# Patient Record
Sex: Male | Born: 1960 | Race: Black or African American | Hispanic: No | Marital: Married | Smoking: Never smoker
Health system: Southern US, Community
[De-identification: ages and names within clinical notes are randomized; demographics above are authoritative.]

## PROBLEM LIST (undated history)

## (undated) DIAGNOSIS — M109 Gout, unspecified: Secondary | ICD-10-CM

## (undated) DIAGNOSIS — E785 Hyperlipidemia, unspecified: Secondary | ICD-10-CM

## (undated) DIAGNOSIS — A159 Respiratory tuberculosis unspecified: Secondary | ICD-10-CM

## (undated) DIAGNOSIS — I5033 Acute on chronic diastolic (congestive) heart failure: Secondary | ICD-10-CM

## (undated) DIAGNOSIS — I10 Essential (primary) hypertension: Secondary | ICD-10-CM

## (undated) DIAGNOSIS — I4891 Unspecified atrial fibrillation: Secondary | ICD-10-CM

## (undated) DIAGNOSIS — J449 Chronic obstructive pulmonary disease, unspecified: Secondary | ICD-10-CM

## (undated) DIAGNOSIS — E669 Obesity, unspecified: Secondary | ICD-10-CM

## (undated) DIAGNOSIS — J45901 Unspecified asthma with (acute) exacerbation: Secondary | ICD-10-CM

## (undated) DIAGNOSIS — J441 Chronic obstructive pulmonary disease with (acute) exacerbation: Secondary | ICD-10-CM

## (undated) DIAGNOSIS — G4730 Sleep apnea, unspecified: Secondary | ICD-10-CM

## (undated) HISTORY — PX: BRONCHOSCOPY: SUR163

---

## 2004-08-21 ENCOUNTER — Ambulatory Visit: Payer: Self-pay | Admitting: General Surgery

## 2005-08-25 ENCOUNTER — Emergency Department: Payer: Self-pay | Admitting: General Practice

## 2007-07-05 ENCOUNTER — Emergency Department: Payer: Self-pay | Admitting: Unknown Physician Specialty

## 2007-11-22 ENCOUNTER — Ambulatory Visit: Payer: Self-pay | Admitting: Family Medicine

## 2008-03-07 ENCOUNTER — Ambulatory Visit: Payer: Self-pay | Admitting: Specialist

## 2008-10-02 ENCOUNTER — Ambulatory Visit: Payer: Self-pay | Admitting: Specialist

## 2008-12-19 ENCOUNTER — Emergency Department: Payer: Self-pay | Admitting: Emergency Medicine

## 2008-12-27 ENCOUNTER — Ambulatory Visit: Payer: Self-pay | Admitting: Internal Medicine

## 2009-11-14 ENCOUNTER — Ambulatory Visit: Payer: Self-pay | Admitting: Family Medicine

## 2009-11-19 ENCOUNTER — Other Ambulatory Visit: Payer: Self-pay | Admitting: Family Medicine

## 2010-04-18 ENCOUNTER — Inpatient Hospital Stay: Payer: Self-pay | Admitting: Internal Medicine

## 2013-02-20 ENCOUNTER — Ambulatory Visit: Payer: Self-pay | Admitting: Family Medicine

## 2014-09-18 ENCOUNTER — Inpatient Hospital Stay: Payer: Self-pay | Admitting: Family Medicine

## 2014-09-18 LAB — BASIC METABOLIC PANEL
Anion Gap: 6 — ABNORMAL LOW (ref 7–16)
BUN: 16 mg/dL (ref 7–18)
Calcium, Total: 8.2 mg/dL — ABNORMAL LOW (ref 8.5–10.1)
Chloride: 104 mmol/L (ref 98–107)
Co2: 29 mmol/L (ref 21–32)
Creatinine: 1.33 mg/dL — ABNORMAL HIGH (ref 0.60–1.30)
EGFR (African American): >60
EGFR (Non-African Amer.): 59 — ABNORMAL LOW
Glucose: 80 mg/dL (ref 65–99)
Osmolality: 278 (ref 275–301)
Potassium: 4.2 mmol/L (ref 3.5–5.1)
SODIUM: 139 mmol/L (ref 136–145)

## 2014-09-18 LAB — CBC
HCT: 46.3 % (ref 40.0–52.0)
HGB: 14.9 g/dL (ref 13.0–18.0)
MCH: 28 pg (ref 26.0–34.0)
MCHC: 32.3 g/dL (ref 32.0–36.0)
MCV: 87 fL (ref 80–100)
Platelet: 231 10*3/uL (ref 150–440)
RBC: 5.35 10*6/uL (ref 4.40–5.90)
RDW: 16.2 % — AB (ref 11.5–14.5)
WBC: 8.7 10*3/uL (ref 3.8–10.6)

## 2014-09-18 LAB — TROPONIN I
TROPONIN-I: 0.16 ng/mL — AB
Troponin-I: 0.14 ng/mL — ABNORMAL HIGH
Troponin-I: 0.16 ng/mL — ABNORMAL HIGH

## 2014-09-18 LAB — CK TOTAL AND CKMB (NOT AT ARMC)
CK, TOTAL: 1000 U/L — AB (ref 39–308)
CK-MB: 22.8 ng/mL — AB (ref 0.5–3.6)

## 2014-09-18 LAB — PROTIME-INR
INR: 1.1
PROTHROMBIN TIME: 14.3 s (ref 11.5–14.7)

## 2014-09-18 LAB — HEPARIN LEVEL (UNFRACTIONATED): ANTI-XA(UNFRACTIONATED): 0.18 [IU]/mL — AB (ref 0.30–0.70)

## 2014-09-18 LAB — PRO B NATRIURETIC PEPTIDE: B-TYPE NATIURETIC PEPTID: 414 pg/mL — AB (ref 0–125)

## 2014-09-18 LAB — APTT: ACTIVATED PTT: 36.5 s — AB (ref 23.6–35.9)

## 2014-09-19 LAB — HEPARIN LEVEL (UNFRACTIONATED): Anti-Xa(Unfractionated): 0.39 IU/mL (ref 0.30–0.70)

## 2014-09-19 LAB — CBC WITH DIFFERENTIAL/PLATELET
Basophil #: 0.1 10*3/uL (ref 0.0–0.1)
Basophil %: 1.2 %
EOS PCT: 4.7 %
Eosinophil #: 0.4 10*3/uL (ref 0.0–0.7)
HCT: 43.8 % (ref 40.0–52.0)
HGB: 14.2 g/dL (ref 13.0–18.0)
LYMPHS PCT: 11.7 %
Lymphocyte #: 1.1 10*3/uL (ref 1.0–3.6)
MCH: 27.6 pg (ref 26.0–34.0)
MCHC: 32.3 g/dL (ref 32.0–36.0)
MCV: 86 fL (ref 80–100)
Monocyte #: 0.9 x10 3/mm (ref 0.2–1.0)
Monocyte %: 9.3 %
NEUTROS ABS: 7 10*3/uL — AB (ref 1.4–6.5)
NEUTROS PCT: 73.1 %
Platelet: 221 10*3/uL (ref 150–440)
RBC: 5.12 10*6/uL (ref 4.40–5.90)
RDW: 15.8 % — ABNORMAL HIGH (ref 11.5–14.5)
WBC: 9.6 10*3/uL (ref 3.8–10.6)

## 2014-09-19 LAB — BASIC METABOLIC PANEL
Anion Gap: 4 — ABNORMAL LOW (ref 7–16)
BUN: 12 mg/dL (ref 7–18)
CALCIUM: 8.5 mg/dL (ref 8.5–10.1)
CO2: 29 mmol/L (ref 21–32)
Chloride: 103 mmol/L (ref 98–107)
Creatinine: 1.03 mg/dL (ref 0.60–1.30)
EGFR (Non-African Amer.): >60
Glucose: 88 mg/dL (ref 65–99)
Osmolality: 271 (ref 275–301)
Potassium: 3.9 mmol/L (ref 3.5–5.1)
Sodium: 136 mmol/L (ref 136–145)

## 2014-09-20 LAB — CBC WITH DIFFERENTIAL/PLATELET
BASOS PCT: 1.2 %
Basophil #: 0.1 10*3/uL (ref 0.0–0.1)
Eosinophil #: 0.6 10*3/uL (ref 0.0–0.7)
Eosinophil %: 5.7 %
HCT: 45.1 % (ref 40.0–52.0)
HGB: 14.7 g/dL (ref 13.0–18.0)
LYMPHS ABS: 1.2 10*3/uL (ref 1.0–3.6)
Lymphocyte %: 12.2 %
MCH: 27.6 pg (ref 26.0–34.0)
MCHC: 32.5 g/dL (ref 32.0–36.0)
MCV: 85 fL (ref 80–100)
Monocyte #: 1.1 x10 3/mm — ABNORMAL HIGH (ref 0.2–1.0)
Monocyte %: 11 %
NEUTROS ABS: 7.1 10*3/uL — AB (ref 1.4–6.5)
Neutrophil %: 69.9 %
Platelet: 248 10*3/uL (ref 150–440)
RBC: 5.31 10*6/uL (ref 4.40–5.90)
RDW: 16.4 % — ABNORMAL HIGH (ref 11.5–14.5)
WBC: 10.2 10*3/uL (ref 3.8–10.6)

## 2014-09-20 LAB — BASIC METABOLIC PANEL
Anion Gap: 8 (ref 7–16)
BUN: 16 mg/dL (ref 7–18)
CREATININE: 1.04 mg/dL (ref 0.60–1.30)
Calcium, Total: 8.5 mg/dL (ref 8.5–10.1)
Chloride: 101 mmol/L (ref 98–107)
Co2: 29 mmol/L (ref 21–32)
EGFR (African American): >60
EGFR (Non-African Amer.): >60
Glucose: 87 mg/dL (ref 65–99)
Osmolality: 276 (ref 275–301)
Potassium: 4.2 mmol/L (ref 3.5–5.1)
Sodium: 138 mmol/L (ref 136–145)

## 2014-09-20 LAB — HEPARIN LEVEL (UNFRACTIONATED): Anti-Xa(Unfractionated): 0.38 IU/mL (ref 0.30–0.70)

## 2014-10-18 ENCOUNTER — Inpatient Hospital Stay: Payer: Self-pay | Admitting: Family Medicine

## 2014-10-18 LAB — BASIC METABOLIC PANEL
ANION GAP: 6 — AB (ref 7–16)
BUN: 11 mg/dL (ref 7–18)
CALCIUM: 8.6 mg/dL (ref 8.5–10.1)
CO2: 29 mmol/L (ref 21–32)
Chloride: 104 mmol/L (ref 98–107)
Creatinine: 1.16 mg/dL (ref 0.60–1.30)
EGFR (African American): >60
EGFR (Non-African Amer.): >60
GLUCOSE: 122 mg/dL — AB (ref 65–99)
OSMOLALITY: 278 (ref 275–301)
Potassium: 4 mmol/L (ref 3.5–5.1)
SODIUM: 139 mmol/L (ref 136–145)

## 2014-10-18 LAB — CBC WITH DIFFERENTIAL/PLATELET
BASOS ABS: 0.1 10*3/uL (ref 0.0–0.1)
Basophil %: 1 %
EOS PCT: 1 %
Eosinophil #: 0.1 10*3/uL (ref 0.0–0.7)
HCT: 45 % (ref 40.0–52.0)
HGB: 14.3 g/dL (ref 13.0–18.0)
Lymphocyte #: 0.7 10*3/uL — ABNORMAL LOW (ref 1.0–3.6)
Lymphocyte %: 7.3 %
MCH: 27 pg (ref 26.0–34.0)
MCHC: 31.8 g/dL — AB (ref 32.0–36.0)
MCV: 85 fL (ref 80–100)
Monocyte #: 0.8 x10 3/mm (ref 0.2–1.0)
Monocyte %: 7.7 %
Neutrophil #: 8.5 10*3/uL — ABNORMAL HIGH (ref 1.4–6.5)
Neutrophil %: 83 %
Platelet: 265 10*3/uL (ref 150–440)
RBC: 5.3 10*6/uL (ref 4.40–5.90)
RDW: 15.5 % — AB (ref 11.5–14.5)
WBC: 10.2 10*3/uL (ref 3.8–10.6)

## 2014-10-18 LAB — TROPONIN I
TROPONIN-I: 0.18 ng/mL — AB
TROPONIN-I: 0.14 ng/mL — AB

## 2014-10-18 LAB — CK TOTAL AND CKMB (NOT AT ARMC)
CK, Total: 1450 U/L — ABNORMAL HIGH (ref 39–308)
CK-MB: 16.9 ng/mL — AB (ref 0.5–3.6)

## 2014-10-18 LAB — PRO B NATRIURETIC PEPTIDE: B-Type Natriuretic Peptide: 1161 pg/mL — ABNORMAL HIGH (ref 0–125)

## 2014-10-19 LAB — TROPONIN I: Troponin-I: 0.11 ng/mL — ABNORMAL HIGH

## 2014-10-19 LAB — CK TOTAL AND CKMB (NOT AT ARMC)
CK, Total: 1276 U/L — ABNORMAL HIGH (ref 39–308)
CK-MB: 15.8 ng/mL — ABNORMAL HIGH (ref 0.5–3.6)

## 2014-10-20 LAB — BASIC METABOLIC PANEL
Anion Gap: 5 — ABNORMAL LOW (ref 7–16)
BUN: 26 mg/dL — ABNORMAL HIGH (ref 7–18)
Calcium, Total: 8.4 mg/dL — ABNORMAL LOW (ref 8.5–10.1)
Chloride: 101 mmol/L (ref 98–107)
Co2: 31 mmol/L (ref 21–32)
Creatinine: 1.04 mg/dL (ref 0.60–1.30)
EGFR (African American): >60
Glucose: 121 mg/dL — ABNORMAL HIGH (ref 65–99)
Osmolality: 280 (ref 275–301)
Potassium: 4.3 mmol/L (ref 3.5–5.1)
Sodium: 137 mmol/L (ref 136–145)

## 2014-10-20 LAB — CBC WITH DIFFERENTIAL/PLATELET
BASOS ABS: 0 10*3/uL (ref 0.0–0.1)
Basophil %: 0.1 %
Eosinophil #: 0 10*3/uL (ref 0.0–0.7)
Eosinophil %: 0.1 %
HCT: 44.1 % (ref 40.0–52.0)
HGB: 14.1 g/dL (ref 13.0–18.0)
LYMPHS PCT: 4.2 %
Lymphocyte #: 0.8 10*3/uL — ABNORMAL LOW (ref 1.0–3.6)
MCH: 26.9 pg (ref 26.0–34.0)
MCHC: 31.9 g/dL — AB (ref 32.0–36.0)
MCV: 84 fL (ref 80–100)
MONO ABS: 1 x10 3/mm (ref 0.2–1.0)
MONOS PCT: 5 %
Neutrophil #: 18.2 10*3/uL — ABNORMAL HIGH (ref 1.4–6.5)
Neutrophil %: 90.6 %
Platelet: 300 10*3/uL (ref 150–440)
RBC: 5.23 10*6/uL (ref 4.40–5.90)
RDW: 15.4 % — ABNORMAL HIGH (ref 11.5–14.5)
WBC: 20 10*3/uL — ABNORMAL HIGH (ref 3.8–10.6)

## 2014-10-24 LAB — CULTURE, BLOOD (SINGLE)

## 2014-12-13 ENCOUNTER — Ambulatory Visit: Payer: Self-pay | Admitting: Family Medicine

## 2014-12-19 ENCOUNTER — Inpatient Hospital Stay: Payer: Self-pay | Admitting: Family Medicine

## 2015-01-05 ENCOUNTER — Inpatient Hospital Stay: Payer: Self-pay | Admitting: Internal Medicine

## 2015-01-22 LAB — CULTURE, FUNGUS WITHOUT SMEAR

## 2015-02-07 ENCOUNTER — Inpatient Hospital Stay
Admit: 2015-02-07 | Disposition: A | Discharge status: Home / Self Care | Payer: Self-pay | Attending: Internal Medicine | Admitting: Internal Medicine

## 2015-02-07 LAB — PRO B NATRIURETIC PEPTIDE: B-Type Natriuretic Peptide: 222 pg/mL — ABNORMAL HIGH

## 2015-02-07 LAB — COMPREHENSIVE METABOLIC PANEL
ANION GAP: 7 (ref 7–16)
Albumin: 2.6 g/dL — ABNORMAL LOW
Alkaline Phosphatase: 58 U/L
BILIRUBIN TOTAL: 0.6 mg/dL
BUN: 11 mg/dL
CALCIUM: 8.4 mg/dL — AB
CO2: 30 mmol/L
CREATININE: 0.91 mg/dL
Chloride: 98 mmol/L — ABNORMAL LOW
Glucose: 102 mg/dL — ABNORMAL HIGH
Potassium: 4.1 mmol/L
SGOT(AST): 33 U/L
SGPT (ALT): 18 U/L
Sodium: 135 mmol/L
Total Protein: 7.1 g/dL

## 2015-02-07 LAB — PROTIME-INR
INR: 1.6
PROTHROMBIN TIME: 19.1 s — AB

## 2015-02-07 LAB — CBC
HCT: 38.9 % — ABNORMAL LOW (ref 40.0–52.0)
HGB: 12.6 g/dL — AB (ref 13.0–18.0)
MCH: 26.5 pg (ref 26.0–34.0)
MCHC: 32.4 g/dL (ref 32.0–36.0)
MCV: 82 fL (ref 80–100)
Platelet: 278 10*3/uL (ref 150–440)
RBC: 4.75 10*6/uL (ref 4.40–5.90)
RDW: 19.5 % — AB (ref 11.5–14.5)
WBC: 8.3 10*3/uL (ref 3.8–10.6)

## 2015-02-07 LAB — TROPONIN I: Troponin-I: 0.03 ng/mL

## 2015-02-08 ENCOUNTER — Other Ambulatory Visit: Payer: Self-pay

## 2015-02-08 LAB — CBC WITH DIFFERENTIAL/PLATELET
Basophil #: 0 10*3/uL (ref 0.0–0.1)
Basophil %: 0.4 %
Eosinophil #: 0 10*3/uL (ref 0.0–0.7)
Eosinophil %: 0.1 %
HCT: 36.3 % — ABNORMAL LOW (ref 40.0–52.0)
HGB: 11.4 g/dL — ABNORMAL LOW (ref 13.0–18.0)
Lymphocyte #: 0.3 10*3/uL — ABNORMAL LOW (ref 1.0–3.6)
Lymphocyte %: 4.9 %
MCH: 25.9 pg — ABNORMAL LOW (ref 26.0–34.0)
MCHC: 31.5 g/dL — ABNORMAL LOW (ref 32.0–36.0)
MCV: 82 fL (ref 80–100)
Monocyte #: 0.2 x10 3/mm (ref 0.2–1.0)
Monocyte %: 3.8 %
Neutrophil #: 5.1 10*3/uL (ref 1.4–6.5)
Neutrophil %: 90.8 %
Platelet: 258 10*3/uL (ref 150–440)
RBC: 4.42 10*6/uL (ref 4.40–5.90)
RDW: 19.4 % — ABNORMAL HIGH (ref 11.5–14.5)
WBC: 5.6 10*3/uL (ref 3.8–10.6)

## 2015-02-08 LAB — BASIC METABOLIC PANEL
ANION GAP: 4 — AB (ref 7–16)
BUN: 12 mg/dL
CO2: 33 mmol/L — AB
Calcium, Total: 8.1 mg/dL — ABNORMAL LOW
Chloride: 99 mmol/L — ABNORMAL LOW
Creatinine: 0.91 mg/dL
EGFR (Non-African Amer.): >60
GLUCOSE: 127 mg/dL — AB
Potassium: 4.5 mmol/L
Sodium: 136 mmol/L

## 2015-02-08 LAB — PROTIME-INR
INR: 1.8
Prothrombin Time: 20.6 secs — ABNORMAL HIGH

## 2015-02-09 LAB — CBC WITH DIFFERENTIAL/PLATELET
BASOS ABS: 0 10*3/uL (ref 0.0–0.1)
Basophil %: 0.2 %
Eosinophil #: 0.2 10*3/uL (ref 0.0–0.7)
Eosinophil %: 1.9 %
HCT: 39.7 % — ABNORMAL LOW (ref 40.0–52.0)
HGB: 12.2 g/dL — AB (ref 13.0–18.0)
Lymphocyte #: 0.4 10*3/uL — ABNORMAL LOW (ref 1.0–3.6)
Lymphocyte %: 2.9 %
MCH: 26 pg (ref 26.0–34.0)
MCHC: 30.7 g/dL — AB (ref 32.0–36.0)
MCV: 85 fL (ref 80–100)
Monocyte #: 0.9 x10 3/mm (ref 0.2–1.0)
Monocyte %: 6.7 %
Neutrophil #: 11.4 10*3/uL — ABNORMAL HIGH (ref 1.4–6.5)
Neutrophil %: 88.3 %
Platelet: 292 10*3/uL (ref 150–440)
RBC: 4.68 10*6/uL (ref 4.40–5.90)
RDW: 19.7 % — ABNORMAL HIGH (ref 11.5–14.5)
WBC: 12.9 10*3/uL — ABNORMAL HIGH (ref 3.8–10.6)

## 2015-02-09 LAB — BASIC METABOLIC PANEL
ANION GAP: 5 — AB (ref 7–16)
BUN: 18 mg/dL
CALCIUM: 8.1 mg/dL — AB
CHLORIDE: 99 mmol/L — AB
Co2: 33 mmol/L — ABNORMAL HIGH
Creatinine: 0.95 mg/dL
EGFR (African American): >60
EGFR (Non-African Amer.): >60
GLUCOSE: 121 mg/dL — AB
POTASSIUM: 4.5 mmol/L
SODIUM: 137 mmol/L

## 2015-02-09 LAB — LIPID PANEL
CHOLESTEROL: 176 mg/dL
HDL Cholesterol: 47 mg/dL
Ldl Cholesterol, Calc: 116 mg/dL — ABNORMAL HIGH
TRIGLYCERIDES: 66 mg/dL
VLDL Cholesterol, Calc: 13 mg/dL

## 2015-02-09 LAB — PROTIME-INR
INR: 1.8
Prothrombin Time: 21.1 secs — ABNORMAL HIGH

## 2015-02-09 LAB — OCCULT BLOOD X 1 CARD TO LAB, STOOL: OCCULT BLOOD, FECES: NEGATIVE

## 2015-02-09 LAB — PHOSPHORUS: Phosphorus: 4.2 mg/dL

## 2015-02-09 LAB — MAGNESIUM: MAGNESIUM: 2 mg/dL

## 2015-02-09 LAB — HEMOGLOBIN A1C: Hemoglobin A1C: 5.5 %

## 2015-02-10 LAB — PROTIME-INR
INR: 1.7
Prothrombin Time: 20.4 secs — ABNORMAL HIGH

## 2015-02-11 LAB — PROTIME-INR
INR: 2
Prothrombin Time: 23 secs — ABNORMAL HIGH

## 2015-02-12 NOTE — Consult Note (Signed)
PATIENT NAME:  Jonathan [name], Jonathan [name] MR#:  [id number] DATE OF BIRTH:  [birth year]  DATE OF CONSULTATION:  09/19/2014  CONSULTING PHYSICIAN:  Marcina MillardAlexander Lowell Mcgurk, MD  PRIMARY CARE PHYSICIAN:  Marisue IvanKanhka Linthavong, MD   CARDIOLOGIST: Marcina MillardAlexander Najma Bozarth, MD   CHIEF COMPLAINT: Shortness of breath.   REASON FOR CONSULTATION: Consultation requested for evaluation of progressive exertional dyspnea and elevated cardiac enzymes suggestive of non-ST elevation myocardial infarction.   HISTORY OF PRESENT ILLNESS: The patient is a 54 year old gentleman with history of sleep apnea on CPAP. The patient has had a 1 to 2 week history of progressive exertional dyspnea. The patient presented to [facility] Emergency Room on 09/18/2014 for shortness of breath with minimal exertion and at rest. Admission labs were notable for elevated troponin of 0.16. CPK and MB were 1022.8, respectively. EKG was nondiagnostic The chest x-ray was nonspecific.   PAST MEDICAL HISTORY: 1. Sleep apnea.  2. Hypertension.  3. Chronic atrial fibrillation.  4. Hyperlipidemia.   MEDICATIONS: Pradaxa 150 mg b.i.[name]., digoxin 0.25 mg daily, losartan 50 mg daily, lovastatin 20 mg daily, metoprolol tartrate 75 mg b.i.[name]., Cardizem CD 180 mg daily, allopurinol 300 mg daily, colchicine 0.6 mg daily, albuterol inhaler 2 puffs 4 times daily.   SOCIAL HISTORY: The patient is married, resides with his wife. He denies tobacco abuse.   FAMILY HISTORY: No immediate family history for coronary artery disease or myocardial infarction.   REVIEW OF SYSTEMS:  CONSTITUTIONAL: No fever or chills.   EYES: No blurry vision.   EARS: No hearing loss.   RESPIRATORY: Progressive exertional dyspnea.   CARDIOVASCULAR: The patient denies chest pain.   GASTROINTESTINAL: No nausea, vomiting or diarrhea.   GENITOURINARY: No dysuria or hematuria.   ENDOCRINE: No polyuria or polydipsia.   MUSCULOSKELETAL: No arthralgias or myalgias.   NEUROLOGIC: No focal muscle  weakness or numbness.   PSYCHOLOGICAL: No depression or anxiety.   PHYSICAL EXAMINATION: VITAL SIGNS: Blood pressure 112/73, pulse 72, respirations 24, temperature 98.2, pulse oximetry 93%.   HEENT: Pupils equal, reactive to light and accommodation.   NECK: Supple without thyromegaly.   LUNGS: Decreased breath sounds.   HEART: Normal JVP. Normal PMI. Regular rate and rhythm. Normal S1, S2. No appreciable gallop, murmur or rub.   ABDOMEN: Soft and nontender. Pulses were intact bilaterally.   MUSCULOSKELETAL: Normal muscle tone.   NEUROLOGIC: The patient is alert and oriented x 3. Motor and sensory both grossly intact.   IMPRESSION: A 54 year old gentleman with progressive exertional dyspnea who presents with worsening shortness of breath with elevated troponin and cardiac isoenzymes suggestive of non-ST elevation myocardial infarction.   RECOMMENDATIONS: 1. Agree with overall current therapy.  2. Hold Pradaxa for now.  3. Continue heparin drip.  4. Proceed with right and left heart cardiac catheterization on 09/20/2014. The risks, benefits and alternatives were explained and informed written consent obtained.    ____________________________ Marcina MillardAlexander Lailana Shira, MD ap:TT [name]: 09/19/2014 09:14:00 ET T: 09/19/2014 14:20:13 ET JOB#: [id number]  cc: Marcina MillardAlexander Jamyria Ozanich, MD, <Dictator> Marisue IvanKanhka Linthavong, MD Marcina MillardALEXANDER Prestina Raigoza MD ELECTRONICALLY SIGNED 10/19/2014 13:07

## 2015-02-12 NOTE — Discharge Summary (Signed)
PATIENT NAME:  Jonathan [name], Jonathan [name] MR#:  [id number] DATE OF BIRTH:  06-04-1961  DATE OF ADMISSION:  09/18/2014 DATE OF DISCHARGE:    DISCHARGE DIAGNOSES:  1.  Dyspnea on exertion.  2.  Atrial fibrillation.  3.  Hypertension.  4.  Obstructive sleep apnea.   CONSULTS: Cardiology per Dr. Darrold JunkerParaschos.   PROCEDURES: Cardiac catheterization which was negative.   PERTINENT LABORATORY AND STUDIES:  On day of discharge sodium 138, potassium 4.2, creatinine 1.04. Troponin 0.14, 0.16, 0.16. CK-MB 22.8. White blood cell count 10.2, hemoglobin 14.7, and platelet count of 248,000.   Chest x-ray negative, no acute cardiopulmonary disease.   BRIEF HOSPITAL COURSE:  Dyspnea on exertion. The patient initially came in complaining of dyspnea on exertion and was found to have slightly elevated cardiac enzymes without any complaints of chest pain or EKG changes. He was seen by cardiology and underwent catheterization which was negative. This was not an NSTEMI which was documented earlier in the chart, per Dr. Darrold JunkerParaschos it is not an NSTEMI. Unclear on reason for dyspnea on exertion, but most likely restrictive lung disease due to his obesity. We will plan to continue with his home regimen, no changes from our standpoint, will need to restart the Pradaxa tomorrow.   DISPOSITION:  He is in stable condition and will be discharged to home.   FOLLOWUP:  Will follow up with Dr. Burnadette PopLinthavong within 10 days and follow up with Dr. Darrold JunkerParaschos per his recommendations.    ____________________________ Jonathan IvanKanhka Camey Edell, MD kl:bu [name]: 09/20/2014 13:19:41 ET T: 09/20/2014 15:39:20 ET JOB#: [id number]  cc: Jonathan IvanKanhka Merrik Puebla, MD, <Dictator> Jonathan IvanKANHKA Shereda Graw MD ELECTRONICALLY SIGNED 10/12/2014 15:26

## 2015-02-12 NOTE — H&P (Signed)
PATIENT NAME:  Jonathan [name], Jonathan [name] MR#:  [id number] DATE OF BIRTH:  [birth year]  DATE OF ADMISSION:  09/18/2014  PRIMARY CARE PHYSICIAN:  Marisue Ivan, MD   CARDIOLOGIST:  Marcina Millard, MD   CHIEF COMPLAINT: Shortness of breath.   HISTORY OF PRESENT ILLNESS: This is a 54 year old male who presents to the hospital with progressive shortness breath getting worse in the past week to 10 days. The patient says that his shortness of breath is worse with minimal exertion, like putting his clothes on or even taking a bath.  The patient went to see his cardiologist, Dr. Darrold Junker just before Thanksgiving, was thought to have bronchitis, was referred to see Dr. Meredeth Ide pulmonologist coming up this Monday.  Although his shortness of breath has continued to get worse and therefore came to the ER for further evaluation.  The patient denies any chest pain, any nausea, vomiting, he did have one episode of diaphoresis, with shortness of breath this past Monday but no other associated symptoms.  In the Emergency Room routine blood work, the patient was noted to have an elevated troponin and also elevated CK and CK-MB consistent with a possible non-ST elevation myocardial infarction.  Hospitalist services were contacted for further treatment and evaluation.   REVIEW OF SYSTEMS: CONSTITUTIONAL: Documented fever. No weight gain, no weight loss.  EYES: No blurred or double vision.  ENT: No tinnitus. No postnasal drip. No redness the oropharynx.  RESPIRATORY: Positive cough, no wheeze, no hemoptysis. Positive dyspnea.  CARDIOVASCULAR: No chest pain, no orthopnea or palpitation no syncope.  GASTROINTESTINAL: No nausea, no vomiting, diarrhea. No abdominal pain. No melena or hematochezia.  GENITOURINARY: No dysuria or hematuria.  ENDOCRINE: No polyuria or nocturia. No heat or cold intolerance.  HEMATOLOGIC: No anemia. No bruising. No bleeding.  INTEGUMENTARY: No rashes. No lesions.  MUSCULOSKELETAL: No  arthritis. No swelling. No gout.  NEUROLOGIC: No numbness or tingling. No ataxia. No seizure-type activity.  PSYCHIATRIC: No anxiety, no insomnia. No ADD.   PAST MEDICAL HISTORY: Consistent with obstructive sleep apnea, hypertension, history of chronic atrial fibrillation, gout, hypertension, hyperlipidemia.   ALLERGIES: No known drug allergies.   SOCIAL HISTORY: No smoking. No alcohol abuse. No illicit drug abuse. Lives at home with his wife.   FAMILY HISTORY: Mother and father are both deceased. Father died from complications of emphysema. Mother had cancer and also had renal disease and was on dialysis.   CURRENT MEDICATIONS: As follows: Allopurinol 300 mg daily Breo Ellipta  100/25 at 1 puff daily, colchicine 0.6 mg daily, digoxin 250 mcg daily, losartan 50 mg daily, lovastatin 20 mg daily, metoprolol tartrate 75 mg b.i.d., Pradaxa 150 mg b.i.d., Cardizem CD 180 mg daily, albuterol inhaler 2 puffs 4 times daily as needed.   PHYSICAL EXAMINATION: Presently is as follows:  VITAL SIGNS: Temperature is 98.7, pulse 74, respirations 25, blood pressure 134/71 saturations 97% on room air.  GENERAL: He is a pleasant-appearing male but in no apparent distress.  HEAD, EYES, EARS, NOSE AND THROAT: Atraumatic, normocephalic. Extraocular muscles are intact. Pupils are equal and reactive to light. Sclerae anicteric. No conjunctival injection. No oropharyngeal erythema.  NECK: Supple. There is no jugular venous distention. No bruits, no lymphadenopathy, no thyromegaly.  HEART: Regular rate and rhythm. No murmurs, no rubs, no clicks.  LUNGS: Clear to auscultation bilaterally. No rales, rhonchi, no wheezes.  ABDOMEN: Soft, flat, nontender, nondistended. Has good bowel sounds. No hepatosplenomegaly appreciated.  EXTREMITIES: No evidence of any cyanosis, clubbing, trace pedal edema from the knees  to the ankles bilaterally, +2 pedal and radial pulses bilaterally.  NEUROLOGICAL: The patient is alert, awake, and  oriented x 3 with no focal motor or sensory deficits appreciated bilaterally.  SKIN: Moist and warm with no rashes appreciated.  LYMPHATIC: There is no cervical lymphadenopathy.   LABORATORY DATA: Serum glucose of 80, BUN 16, creatinine 1.3, sodium 139, potassium 4.2, chloride 104, bicarbonate 29. CK 1000, CK-MB 22.8, troponin 0.14, white cell count 8.7, hemoglobin 14.9, hematocrit 46.3, platelet count 231,000.  INR is 1.1. The patient did have a chest x-ray done which showed no evidence of any acute cardiopulmonary disease.   ASSESSMENT AND PLAN: This is a 54 year old male with history of obstructive sleep apnea, hypertension, history of chronic atrial fibrillation, gout, hyperlipidemia who presents to the hospital due to worsening shortness of breath and noted to have a possible non-ST elevation myocardial infarction.  1.  Non-ST elevation myocardial infarction. The patient presented with shortness of breath, but no chest pain, likely shortness of breath is an anginal equivalent. The patient has ruled in by cardiac markers.  For now I will keep the patient on telemetry, start him on aspirin and heparin nomogram. Continue his beta blocker and a statin. I will get a 2-dimensional echocardiogram and also get a cardiology consult. Discussed the case with Dr. Darrold JunkerParaschos who will see the patient.  2.  History of chronic atrial fibrillation. The patient is rate controlled.  I will continue his metoprolol, Cardizem and digoxin. Hold his Pradaxa as he is going to be on a heparin nomogram.  3.  Hypertension. The patient is hemodynamically stable. We will continue his metoprolol, Cardizem and losartan.  4.  History of gout. No acute attack. Continue with the allopurinol and colchicine.  5.  Hyperlipidemia. Continue atorvastatin.  6.  Obstructive sleep apnea. Continue CPAP.   CODE STATUS: THE PATIENT IS A FULL CODE.   TIME SPENT:  50 minutes.     ____________________________ Rolly PancakeVivek J. Cherlynn KaiserSainani,  MD vjs:DT D: 09/18/2014 14:27:32 ET T: 09/18/2014 14:57:16 ET JOB#: [id number]  cc: Rolly PancakeVivek J. Cherlynn KaiserSainani, MD, <Dictator> Houston SirenVIVEK J SAINANI MD ELECTRONICALLY SIGNED 09/25/2014 15:55

## 2015-02-12 NOTE — H&P (Signed)
PATIENT NAME:  Jonathan [name], Oral D MR#:  [id number] DATE OF BIRTH:  07-15-1961  DATE OF ADMISSION:  10/18/2014  PRIMARY CARE PHYSICIAN:  Jonathan [name].    CHIEF COMPLAINT: Increasing shortness of breath, fever, and hypoxia noted at Jonathan [name]'s office.   HISTORY OF PRESENT ILLNESS:  Jonathan [name] is a 54 year old African-American gentleman with past medical history of hypertension, type 2 diabetes not on any medications, history of chronic atrial fibrillation on Pradaxa, who was admitted directly from Jonathan [name]'s office after he presented with fever, dry cough, not feeling well, tachycardia. Workup in Jonathan [name]'s office noted the patient was hypoxic with saturations of 88. Chest x-ray done in the office showed left-sided pneumonia. He is being admitted with acute hypoxic respiratory failure with pneumonia, possible pulmonary vascular congestion.   The patient states he has been feeling short of breath for the last 3-4 months, more so for the last 6 months, has gained about 20 pounds, and also has been retaining fluid in his legs. He does not have known history of congestive heart failure, however he does have atrial fibrillation and hypertension which have been long-standing.   PAST MEDICAL HISTORY:  1.  Chronic atrial fibrillation, on Pradaxa.   2.  Gout.  3.  Hypercholesterolemia.  4.  Type 2 diabetes not on any medications.  5.  Hypertension.  6.  Morbid obesity.  7.  Sleep apnea on CPAP.  8.  The patient underwent recently a cardiac catheterization in November of 2015, which showed EF of 60% and normal coronaries. He also had an echo done at that time that showed EF of 55-60%, no valvular abnormality   ALLERGIES: No known drug allergies.   MEDICATIONS:  1. Ventolin HFA 2 puffs 4 times a day.  2. Taztia XT 180 mg daily.  3. Pradaxa 150 mg b.i.d.  4. Metoprolol tartrate 50 mg 1 tablet b.i.d.  5. Lovastatin 20 mg p.o. daily.  6. Losartan 50 mg daily.  7. Indomethacin 50 mg 3 times a  day.  8. Digoxin 250 mcg 1 p.o. daily.  9. Colcrys 0.6 mg p.o. daily.  10. Breo Ellipta 100/25 1 puff once a day.  11. Benadryl 25 mg 1 capsule daily as needed.  12. Allopurinol 300 mg p.o. daily.   REVIEW OF SYSTEMS:    CONSTITUTIONAL: Positive for fever, fatigue, and weakness.  EYES: No blurred or double vision, glaucoma.  No cataract.  EARS, NOSE, AND THROAT:no congestion, sinus drainage or post nasal drip RESPIRATORY: Positive for cough, shortness of breath.  CARDIOVASCULAR: No chest pain. Positive for orthopnea, edema, and dyspnea on exertion.  GASTROINTESTINAL: No nausea, vomiting, diarrhea, abdominal pain. No GERD.  GENITOURINARY: No dysuria, hematuria, or frequency.  ENDOCRINE: No polyuria, nocturia, or thyroid problems.  HEMATOLOGY: No anemia, easy bruising, or bleeding.  SKIN: No acne, rash, or lesion.  MUSCULOSKELETAL: Positive for arthritis.  No swelling or gout.  NEUROLOGIC: No CVA, TIA, vertigo, or ataxia.  PSYCHIATRIC: No anxiety or depression.   All other systems reviewed and negative.   PHYSICAL EXAMINATION:  GENERAL: The patient is awake, alert, oriented x 3, not in acute distress.    VITAL SIGNS: Afebrile. Pulse is 90, blood pressure is 125/79, saturations are 92% on 3 liters.  Morbidly obese. HEENT: Atraumatic, normocephalic. Pupils PERRLA.  EOM intact. Oral mucosa is moist.  NECK: Supple. No JVD. No carotid bruit.  RESPIRATORY: Decreased breath sounds at the bases. No rales, rhonchi, respiratory distress, or labored breathing.  CARDIOVASCULAR: Both heart sounds  are normal. Mild tachycardia. No murmur heard. PMI not lateralized. Chest nontender.  Good pedal pulses, good femoral pulses. No lower extremity edema. ABDOMEN: Soft, benign, nontender. No organomegaly. Obesity present. EXTREMITIES:  Lower extremities, good pedal pulses, good femoral pulses, 2 + pitting edema both lower extremities.  NEUROLOGIC: Grossly intact cranial nerves II through XII. No motor or  sensory deficit.  PSYCHIATRIC: The patient is awake, alert, oriented x 3.   LABORATORY DATA:  The patient's laboratories are still pending.   ASSESSMENT: A 54 year old, Jonathan [name], with history of obstructive sleep apnea, hypertension, history of chronic atrial fibrillation on Pradaxa, gout, hyperlipidemia, came in due to worsening shortness of breath, was found to have:   1.  Systemic inflammatory response syndrome secondary to left-sided pneumonia. Chest x-ray done in Dr. Reita Cliche office showed pneumonia, per Jonathan [name] his saturations were in the 80s. The patient is going to be admitted on the medical floor, continue IV Rocephin and Zithromax. We will order blood cultures, CBC, metabolic panel. DuoNebs around the clock along with inhalers.  We will give him IV Solu-Medrol for his underlying COPD.  Wean as the patient improves.   2.  Acute hypoxic respiratory failure, could be combination of chronic obstructive pulmonary disease exacerbation with pneumonia and/or congestive heart failure, mild acute diastolic. His echo showed ejection fraction of 60%, he recently had a cardiac catheterization with normal coronaries. The patient has long-standing history of atrial fibrillation and hypertension as well. I will give him a dose of IV Lasix, see how he responds. He also has leg edema, 2 + pitting edema. His BNP is pending.  3.  History of chronic atrial fibrillation, rate controlled. Continue metoprolol, digoxin, and Cardizem. Continue Pradaxa.   4.  Hypertension. Continue metoprolol, Cardizem, and losartan.  5.  History of gout. No acute attack, continue allopurinol and colchicine.   6.  Hyperlipidemia, on atorvastatin.  7.  Obstructive sleep apnea. CPAP.  8.  Deep vein thrombosis prophylaxis. The patient is already on Pradaxa.    Further workup according to the patient's clinical course. A [name] consultation has been placed for Jonathan [name]. The above was discussed with patient  and the patient's wife who was present in the room.   TIME SPENT: 50 minutes.     ____________________________ Jonathan Hail Allena Katz, MD sap:bu D: 10/18/2014 15:30:54 ET T: 10/18/2014 15:48:50 ET JOB#: [id number]  cc: Oley Lahaie A. Allena Katz, MD, <Dictator> Willow Ora MD ELECTRONICALLY SIGNED 10/19/2014 18:44

## 2015-02-13 LAB — CULTURE, BLOOD (SINGLE)

## 2015-02-13 LAB — EXPECTORATED SPUTUM ASSESSMENT W GRAM STAIN, RFLX TO RESP C

## 2015-02-16 NOTE — Consult Note (Signed)
PATIENT NAME:  Jonathan [name], Jonathan [name] MR#:  [id number] DATE OF BIRTH:  Aug 03, 1961  DATE OF CONSULTATION:  10/19/2014  REFERRING PHYSICIAN:  Dr. Marisue IvanKanhka Linthavong CONSULTING PHYSICIAN:  Dwayne [name]. Callwood, MD    CHIEF COMPLAINT:  Shortness of breath, fever, hypoxemia, borderline troponins and possible pneumonia.   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 Verdie ShireFlemings office and after presenting  with fever, dry cough, not feeling well.  He had been recently hospitalized a few weeks ago for bronchitis and then returned again with more hypoxemia, shortness of breath and low-grade fever. His saturations were below 90. Chest x-ray done in the office showed left-sided pneumonia. The patient is being admitted for hypoxemia and shortness of breath and pulmonary vascular congestion with respiratory failure. He has had borderline troponins.  Atrial fibrillation appears to be rate controlled and he is on anticoagulation. He has been feeling short of breath for the last 3 to 4 months, not feeling that he is getting any better. He has gained about 20 or 30 pounds  retains fluid in his legs.  He denies history of  congestive heart failure and has had a cardiac catheterization in the past with no significant coronary disease.  He states that he is compliant with his medications; now here for evaluation.   PAST MEDICAL HISTORY: Chronic atrial fibrillation, gout, hyperlipidemia, diabetes, hypertension, morbid obesity, obstructive sleep apnea.    ALLERGIES:  No significant allergies.    MEDICATIONS:  He is on Ventolin, nebulizers as needed, diltiazem 180 mg daily, Pradaxa 150 twice a day, metoprolol 50 mg twice a day, lovastatin 20 mg a day, Indomethacin 50 mg 3 times a day, digoxin 0.25 mg once a day, Colcrys 0.6 mg a day, Benadryl 25 mg as needed, allopurinol 300 mg  daily, Breo Ellipta 100/25 mg 1 puff daily.   REVIEW OF SYSTEMS: Denies blkout spells syncope and no significant nausea or vomiting. He has had low-grade fever and some chills, minimal sweats. He has had weight gain, no weight loss. No hemoptysis or hematemesis. No bright red blood per rectum.  He has had shortness of breath, cough    are positive. No significant chest pain.   PHYSICAL EXAMINATION:  VITAL SIGNS: Blood pressure 130/80, pulse of 90 and irregular, respiratory rate 20 on 3 liters.  HEENT: Normocephalic, atraumatic. Pupils equal and reactive to light.  NECK: Supple. No significant JVD, bruits or adenopathy.  LUNGS: Bilateral rhonchi. Decreased breath sounds bilaterally no significant rales.  HEART: Irregularly irregular, systolic ejection murmur at the apex.  ABDOMEN: Benign.  EXTREMITIES: Within normal limits.  NEUROLOGIC: WNL  normal.  LABORATORY AND IMAGING DATA:  Chest x-ray appears to have a left-sided pneumonia. EKG, atrial fibrillation, rate controlled at about 80, nonspecific findings. .  Saturations are 93% on 3 liters.   ASSESSMENT:   1.  Respiratory failure.  2.  Hypoxemia.  3.  Pneumonia.  4.  Atrial fibrillation.  5.  Obesity.  6.  Shortness of breath.  7.  Hypertension.  8.  Gout.  9. Obesity.  10.  Edema.   PLAN: Agree with admit. Place on telemetry for atrial fibrillation, continue supplemental oxygen for dyspnea, shortness of breath and respiratory failure. Broad-spectrum antibiotics continued, for pneumonia agree with pulmonary input. Continue inhalers as necessary.  Follow up with chest x-ray. Consider steroid therapy for inflammation as well as DuoNebs, follow  up white count.  for hypoxemia, continue on  supplemental oxygen, echocardiogram  function, heart failure is  diastolic dysfunction, BNP will be helpful. Lasix therapy might be helpful for edema, as well as inhalers for congestion and steroids to help with hypoxemia.  2.  Chronic atrial  fibrillation, rate control, metoprolol,   3.  Hypertension, diltiazem and metoprolol. The patient also has had losartan. Will continue blood pressure control therapy.  4.  For gout he has been on in the past prescription allopurinol, appears stable from a gout perspective for now.  5. recommend weight loss, exercise, . 6.  Hyperlipidemia, continue Lipitor therapy, liver studies with his primary. 7.  Obstructive sleep apnea, I recommend CPAP therapy as well.  8.  Continue deep vein thrombosis prophylaxis with Pradaxa. I do not recommend any direct cardiac work-up at this point. The patient has been followed by We will continue to treat the patient medically for what appears to be respiratory related episode and see how the patient responds.    ____________________________ Bobbie Stack Juliann Pares, MD ddc:at [name]: 10/19/2014 09:15:51 ET T: 10/19/2014 09:43:59 ET JOB#: [id number]  cc: Dwayne [name]. Juliann Pares, MD, <Dictator> Alwyn Pea MD ELECTRONICALLY SIGNED 11/10/2014 14:03

## 2015-02-16 NOTE — Discharge Summary (Signed)
PATIENT NAME:  Jonathan [name], Jonathan [name] MR#:  [id number] DATE OF BIRTH:  31-Oct-1960  DATE OF ADMISSION:  10/18/2014 DATE OF DISCHARGE:  10/21/2014  DISCHARGE DIAGNOSES: 1. Left lower lobe pneumonia that is acute, that is community-acquired.  2. Restrictive lung disease.  3. History of atrial fibrillation on Pradaxa.   DISCHARGE MEDICATIONS:  1. Taztia XT 180 mg p.o. daily.  2. Lovastatin 20 mg p.o. daily.  3. Pradaxa 150 mg p.o. b.i.[name].  4. Colcrys 0.6 mg p.o. daily.  5. Digoxin 250 mcg p.o. daily.  6. Allopurinol 300 mg p.o. daily.  7. Losartan 50 mg p.o. daily.  8. Ventolin 90 mcg 2 puffs every 4 hours as needed for wheezing.  9. Breo Ellipta 100 mcg/25 mcg 1 puff daily.  10. Metoprolol tartrate 50 mg p.o. b.i.[name].  11. Levaquin 750 mg p.o. daily x 7 more days.  12. Prednisone taper x 12 days.    PROCEDURES: None.   PERTINENT LABORATORIES AND STUDIES: Chest x-ray confirmed left lower lobe pneumonia. Prior to discharge: Sodium 137, potassium 4.3, chloride 1.04, glucose 121. White blood cell count of 20, hemoglobin 14.1, and platelets of 300.   BRIEF HOSPITAL COURSE: Acute left lower lobe pneumonia. The patient has come in with acute on chronic respiratory failure due to left lower lobe community-acquired pneumonia, was placed on dual antibiotics and improved drastically. He was also placed on, prednisone. We will plan to transition to oral Levaquin and oral prednisone taper. His white blood cell count was elevated, but likely due to prednisone-induced.   DISPOSITION: He is in stable condition and will be discharged to home.   FOLLOWUP APPOINTMENTS: Follow with me in the clinic on Monday.    ____________________________ Jonathan IvanKanhka Stiven Kaspar, MD kl:mw [name]: 10/21/2014 08:31:58 ET T: 10/21/2014 12:22:55 ET JOB#: [id number]  cc: Jonathan IvanKanhka Othel Hoogendoorn, MD, <Dictator> Jonathan IvanKANHKA Jujuan Dugo MD ELECTRONICALLY SIGNED 11/04/2014 12:02

## 2015-02-20 NOTE — Discharge Summary (Signed)
PATIENT NAME:  Jonathan [name], Jonathan [name] MR#:  [id number] DATE OF BIRTH:  [birth year]  DATE OF ADMISSION:  02/07/2015 DATE OF DISCHARGE:  02/11/2015  DISCHARGE DIAGNOSES: 1.  Recurrent pneumonia. 2.  Drug-resistant tuberculosis. 3.  Sleep apnea.  4.  Atrial fibrillation.   CONDITION ON DISCHARGE: Stable.   MEDICATIONS ON DISCHARGE: 1.  Allopurinol 300 mg oral tablet once a day.  2.  Metoprolol 50 mg 2 times a day.  3.  Cardizem 300 mg extended release once a day.  4.  Furosemide 20 mg oral 2 times a day.  5.  Warfarin 10 mg oral tablet once a day on Saturday and Sunday and warfarin 4 mg oral tablet 2 tablets once a day except Saturday and Sunday.  6.  Lovastatin 20 mg oral once a day.  7.  Pyridoxine 25 mg oral tablet 2 times a week.  8.  Ethambutol 400 mg oral tablet 5 tablets 2 times a week on Monday and Thursday. 9.  Pyrazinamide  500 mg oral tablet 4 tablets 2 times a week on Monday and Thursday. 10.  Rifampin 300 mg oral capsule 2 times a week on Monday and Thursday. 11.  Isoniazid 300 mg oral tablet 2 times a week on Monday and Thursday.  12.  Losartan 50 mg oral tablet once a day.  13.  Digoxin 250 mcg oral once a day.  14.  Colcrys 0.6 mg oral tablet once a day as needed for gout.  15.  Ventolin 2 puff inhalation 4 times a day as needed for shortness of breath.  16.  Prednisone 10 mg oral tablet start at 60 mg and taper x 5 mg daily until complete.  17.  Spiriva 18 mcg oral capsule once a day, inhalation. 18.  Pantoprazole 40 mg delayed-release tablet once a day.  HOME OXYGEN: Advised 5 liters.  DISCHARGE DIET: Low sodium, regular consistency diet.   TIMEFRAME TO FOLLOWUP: Advised to follow with primary care doctor, Dr. Dennison Mascot in a month, primary pulmonologist, Dr. Ned Clines, and infectious disease, Dr. Colin Broach office.   HISTORY OF PRESENTING ILLNESS: A 54 year old African American male who came to the Emergency Room with shortness of breath for the last 2  days. He was diagnosed with recurrent pneumonia and was treated with IV antibiotic. He was recently diagnosed with tuberculosis and was under treatment by health department for the last 2 to 3 weeks. He was extremely short of breath, was hypoxic up to 88% on 5 liters oxygen saturation and could not tolerate it, exertion on ambulation, and so he was sent to Emergency Room for further management.   HOSPITAL COURSE AND STAY:  1.  Acute hypoxic respiratory failure secondary to recurrent pneumonia. It was health-care associated with underlying active tuberculosis, obstructive sleep apnea. He was initially admitted to CCU stepdown unit with 6 liters of oxygen via nasal cannula. IV antibiotics of Zosyn, vancomycin and levofloxacin was started. CT scan of the chest was done, which showed worsening or necrosis on the lungs. Infectious disease consult with Dr. Sampson Goon was done. He  suggested to stop Zosyn and just continue Levaquin, which we gave for 5 days. After that the patient was feeling significantly better, was comfortable on 5 liters oxygen and able to walk minimum in his room, to go to the bathroom, and so Dr. Sampson Goon agreed on his release home and continuing his tuberculosis medication, as per health department recommendation as the patient was taking it on admission. As the patient had recurrent  pneumonia, we also had gastroenterology consult for concern of his aspiration. They suggested to do precautions while eating but did not do any further invasive work-up because of his tuberculosis.  2.  Chronic atrial fibrillation. He was stable. We continued his Coumadin and metoprolol.   3.  Anemia. Guaiac was done, which was negative. Hemoglobin stayed stable in the hospital. 4.  Hypertension, essential hypertension with good control with medication.  5.  Lung damage with tuberculosis and recurrent pneumonias. The patient was using oxygen and we discharged him on oxygen at home with some  inhalers.  CONSULTATION IN HOSPITAL: Dr. Sampson [name] for infectious disease.   DIAGNOSTIC DATA: BNP level was 222 on admission. WBC 8.3, hemoglobin 12.6, platelet count 278,000. Glucose 102, BUN 11, creatinine 0.91, sodium 135, potassium 4.1. Troponin 0.03.   CT chest with and without contrast: Worsening multilobar necrotizing pneumonia with progressive cavitation throughout the lung bilaterally. Interval development of small amount of pericardial fluid and thickening.   Echocardiogram was done which showed ejection fraction of 50% to 55%, normal global left ventricular systolic function, mild to moderate increased left ventricular internal cavity size, moderately increased left anterior and posterior wall thickness.   TOTAL TIME SPENT ON THIS DISCHARGE: 40 minutes.  ____________________________ Jonathan PigeonVaibhavkumar G. Elisabeth PigeonVachhani, MD vgv:sb D: 02/14/2015 22:18:53 ET T: 02/15/2015 09:18:17 ET JOB#: [id number]  cc: Jonathan PigeonVaibhavkumar G. Elisabeth PigeonVachhani, MD, <Dictator> Jonathan E. Meredeth IdeFleming, MD Jonathan Mainlandavid P. Sampson GoonFitzgerald, MD Heath GoldVAIBHAVKUMAR [facility] Center[facility] MD ELECTRONICALLY SIGNED 02/15/2015 10:39

## 2015-02-20 NOTE — Consult Note (Addendum)
PATIENT NAME:  Jonathan [name], Jonathan [name] MR#:  [id number] DATE OF BIRTH:  02-22-1961  INFECTIOUS DISEASE CONSULTATION  DATE OF CONSULTATION:  02/08/2015.  REFERRING PHYSICIAN:  Ramonita Lab, MD.   CONSULTING PHYSICIAN:  Stann Mainland. Sampson Goon, MD.  REASON FOR CONSULTATION:  Tuberculosis and worsening pneumonia.   HISTORY OF PRESENT ILLNESS:  This is a 54 year old gentleman with a history of recent diagnosis of pulmonary tuberculosis based on a culture from a BAL.  He has been started on oral tuberculosis treatment through the Health Department.  He apparently is receiving it twice a week.  Apparently he has had followup cultures done with AFB smears being negative.   He was seen April 18 in the Health Department and was found to be short of breath and hypoxic.  He was brought to the Emergency Room where he had worsening bilateral lung opacities noted.  He was initially admitted to the intensive care unit.  He was also found to have some pericardial effusion.   PAST MEDICAL HISTORY:  1. Active TB, recently diagnosed, on therapy.  2. History of COPD. 3. Paroxysmal AFib, initially on Pradaxa but now on Coumadin.  4. Diastolic CHF.   5. Hypertension.  6. Diabetes.  7. Gout.  8. Hyperlipidemia.  9. Morbid obesity.  10.  OSA.  11.  Recurrent cavitary pneumonia since December 2015.   PAST SURGICAL HISTORY:  None.   ALLERGIES:  No known drug allergies.   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  He lives at home with his wife.  No history of smoking, alcohol, or drug use.   FAMILY HISTORY:  Positive for hypertension.   REVIEW OF SYSTEMS:  Eleven systems reviewed and negative except for HPI.   PHYSICAL EXAMINATION:  VITAL SIGNS:  Temperature 98.2, pulse 70, blood pressure 95/61, respirations 25, saturation 97% on 6 liters.  GENERAL:  He is chronically ill appearing.  He is lying in bed, however, and appears relatively comfortable on several liters of oxygen.  He is able to  speak in full sentences.  HEENT:  His pupils are reactive.  Sclerae are anicteric.  Oropharynx is clear.  NECK:  Supple.  He has no anterior cervical, posterior cervical, or supraclavicular lymphadenopathy.  HEART:  Regular but distant.  LUNGS:  Crackles in bilateral bases and poor air movement.  ABDOMEN:  Obese, soft, nontender.  EXTREMITIES:  1+ edema.  NEUROLOGIC:  He is alert and oriented x 3.  Grossly nonfocal neurologic exam.   LABORATORY DATA:   AFB from BAL done March 4 is positive for MTB with sensitivities pending.  AFB smear was negative at that time.  Urine legionella antigen was negative at that time.  He also had multiple cultures done on his BAL which were negative from March 4 for yeast and for routine bacteria.  Blood cultures have been negative from February 28.  White count on admission April 18 was 8.3, currently 5.6 on April 19.  Hemoglobin 11.4, platelets 258,000. INR 1.8.  Blood cultures negative x 1.  Renal function shows creatinine 0.91. LFTs normal except albumin low at 2.6.  Echocardiogram showed EF 50% to 55%, mild mitral valve regurgitation.   IMAGING:  CT of his chest April 18 showed worsening multilobar necrotizing pneumonia with progressive cavitation throughout the lungs bilaterally. There is interval development of small amount of pericardial fluid and/or thickening with proteinaceous hemorrhagic pericardial fluid.  Of note, the echocardiogram reports no evidence of pericardial effusion.  IMPRESSION:  A 54 year old with recently diagnosed tuberculosis, on therapy for several weeks, admitted with increasing shortness of breath and hypoxia.  He is relatively chronically ill with morbid obesity as well as sleep apnea.  There is some concern for aspiration given his body habitus and also the fact that on prior CT scans, he has had quite a patulous esophagus with fluid in it.  Clinically since admission, he seems somewhat improved.  He does not seem to be too far from his  baseline and in fact seems improved over his last admission.   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  This can occur in non-HIV infected patients as tuberculosis itself is immunosuppressive, and as treatment is initiated, patients will often get worse before they get better.  No other evidence of active pulmonary infection, although he has been covered with broad-spectrum antibiotics at this point.  He has been started on steroids which may be contributing to his improvement.   RECOMMENDATIONS:   1. I would continue anti-TB treatment.  Sensitivities are pending and should be back shortly.  He is under therapy with the local health department.  2. I would suggest discontinuing the vancomycin as we have no evidence of an MRSA infection at this point.  He has had BAL that was negative when we initially thought this might have been a pathogen.    3. Can continue the Zosyn and levofloxacin for another day but if he continues to remain stable, would discontinue those.  4. I would continue the Solu-Medrol, however, can wean that over the next 7-10 days.  5. Consider gastroenterology evaluation for possible aspiration.  His lungs are quite damaged, and if he does aspirate or develop another infection on top of this, it could be quite severe.    Thank you for the consult.  I will be glad to follow with you.    ____________________________ Stann Mainlandavid P. Sampson GoonFitzgerald, MD dpf:kc D: 02/08/2015 21:07:00 ET T: 02/08/2015 21:58:03 ET JOB#: [id number]  cc: Stann Mainlandavid P. Sampson GoonFitzgerald, MD, <Dictator> Kaladin Noseworthy Sampson GoonFITZGERALD MD ELECTRONICALLY SIGNED 02/22/2015 10:21

## 2015-02-20 NOTE — Op Note (Signed)
PATIENT NAME:  Jonathan [name], Jonathan [name] MR#:  [id number] DATE OF BIRTH:  15-Jul-1961  DATE OF PROCEDURE:  02/07/2015  PREOPERATIVE DIAGNOSES:  1.  Tuberculosis.  2.  Respiratory failure with hypoxia.   POSTOPERATIVE DIAGNOSIS:  1.  Tuberculosis.  2.  Respiratory failure with hypoxia.  PROCEDURES PERFORMED:  Insertion of triple-lumen catheter right internal jugular with ultrasound guidance.   SURGEON: Renford DillsGregory G Kunal Levario, M.[name].   BRIEF HISTORY:  The patient is in the intensive care unit and he is critically ill and hypoxic, but does not have adequate IV access.  Risks and benefits of a central line placement are reviewed.  The patient agrees to proceed.   DESCRIPTION OF PROCEDURE:  He is positioned supine and his right neck is prepped and draped in sterile fashion.  Ultrasound is placed in a sterile sleeve.  Jugular vein is identified.  It is echolucent and compressible indicating patency.  Image is recorded for the permanent record.  Under real-time visualization, the jugular vein is accessed with a micropuncture needle, microwire followed by micro sheath, J-wire followed by the dilator and then the triple-lumen catheter.  All three lumens aspirate and flush easily.  The catheter is secured to the skin of the neck with 2-0 silk and a sterile dressing with Biopatch is applied.  The patient tolerated the procedure well and there were no immediate complications.    ____________________________ Renford DillsGregory G. Abigail Marsiglia, MD ggs:[id number] [name]: 02/07/2015 20:29:11 ET T: 02/07/2015 21:09:17 ET JOB#: [id number]  cc: Renford DillsGregory G. Rayven Hendrickson, MD, <Dictator> Renford DillsGREGORY G Eveline Sauve MD ELECTRONICALLY SIGNED 02/08/2015 12:56

## 2015-02-20 NOTE — Consult Note (Signed)
Chief Complaint:  Subjective/Chief Complaint Patient states she feels somewhat better less short of breath less wheezing improved dyspnea still has leg edema no fevers feels better than yesterday.   VITAL SIGNS/ANCILLARY NOTES: **Vital Signs.:   30-Dec-15 11:59  Vital Signs Type Routine  Temperature Temperature (F) 97.6  Celsius 36.4  Pulse Pulse 76  Respirations Respirations 20  Systolic BP Systolic BP 774  Diastolic BP (mmHg) Diastolic BP (mmHg) 68  Mean BP 81  Pulse Ox % Pulse Ox % 93  Pulse Ox Activity Level  At rest  Oxygen Delivery Room Air/ 21 %  *Intake and Output.:   30-Dec-15 12:00  Oral Intake      In:  240  Percentage of Meal Eaten  100   Brief Assessment:  GEN well developed, well nourished, no acute distress, obese   Cardiac Irregular  murmur present  + LE edema  + JVD  --Gallop   Respiratory clear BS  postive use of accessory muscles  wheezing  rhonchi   Gastrointestinal Normal   Gastrointestinal details normal Soft   EXTR negative cyanosis/clubbing, positive edema   Lab Results: Routine Micro:  28-Dec-15 15:04   Organism Name COAGULASE NEGATIVE STAPHYLOCOCCUS  Micro Text Report BLOOD CULTURE   ORGANISM 1                COAGULASE NEGATIVE STAPHYLOCOCCUS   COMMENT                   IN AEROBIC BOTTLE ONLY   COMMENT                   POSSIBLE CONTAMINATION W/SKIN FLORA   GRAM STAIN                GRAM POSITIVE COCCI IN CLUSTERS   ANTIBIOTIC                       Organism 1 COAGULASE NEGATIVE STAPHYLOCOCCUS  Culture Comment IN AEROBIC BOTTLE ONLY  Culture Comment . POSSIBLE CONTAMINATION W/SKIN FLORA  Gram Stain 1 GRAM POSITIVE COCCI IN CLUSTERS  Routine Chem:  28-Dec-15 15:04   Glucose, Serum  122  BUN 11  Creatinine (comp) 1.16  Sodium, Serum 139  Potassium, Serum 4.0  Chloride, Serum 104  CO2, Serum 29  Calcium (Total), Serum 8.6  Anion Gap  6  Osmolality (calc) 278  eGFR (African American) >60  eGFR (Non-African American) >60 (eGFR  values <40m/min/1.73 m2 may be an indication of chronic kidney disease (CKD). Calculated eGFR, using the MRDR Study equation, is useful in  patients with stable renal function. The eGFR calculation will not be reliable in acutely ill patients when serum creatinine is changing rapidly. It is not useful in patients on dialysis. The eGFR calculation may not be applicable to patients at the low and high extremes of body sizes, pregnant women, and vegetarians.)  Result Comment TROPONIN - RESULTS VERIFIED BY REPEAT TESTING.  - C/TO DOLL FERGUSON AT 1610 10/18/2014  - BY [facility].  - READ-BACK PROCESS PERFORMED.  Result(s) reported on 18 Oct 2014 at 03:54PM.  Result Comment AEROBIC BOTTLE - NOTIFIED OF CRITICAL VALUE  - READ-BACK PROCESS PERFORMED.  - JESSICA CHRISTMAS 10/20/14 @ 1053..Marland KitchenMarland KitchenTV  Result(s) reported on 24 Oct 2014 at 07:08AM.  B-Type Natriuretic Peptide ([facility]  1161 (Result(s) reported on 18 Oct 2014 at 03:36PM.)    18:55   Result Comment TROPONIN - RESULTS VERIFIED BY REPEAT TESTING.  - PREV.CALLED BY TFK @  1610 ON 10/18/14  - CAF  Result(s) reported on 18 Oct 2014 at 07:31PM.    23:17   Result Comment TROPONIN. - RESULTS VERIFIED BY REPEAT TESTING.  - Elevated troponin previously called @  - 1610 10/18/14 by [facility] - EKM.  Result(s) reported on 19 Oct 2014 at 12:10AM.  30-Dec-15 03:56   Glucose, Serum  121  BUN  26  Creatinine (comp) 1.04  Sodium, Serum 137  Potassium, Serum 4.3  Chloride, Serum 101  CO2, Serum 31  Calcium (Total), Serum  8.4  Anion Gap  5  Osmolality (calc) 280  eGFR (African American) >60  eGFR (Non-African American) >60 (eGFR values <27m/min/1.73 m2 may be an indication of chronic kidney disease (CKD). Calculated eGFR, using the MRDR Study equation, is useful in  patients with stable renal function. The eGFR calculation will not be reliable in acutely ill patients when serum creatinine is changing rapidly. It is not useful in patients on dialysis.  The eGFR calculation may not be applicable to patients at the low and high extremes of body sizes, pregnant women, and vegetarians.)  Cardiac:  28-Dec-15 15:04   Troponin I  0.18 (0.00-0.05 0.05 ng/mL or less: NEGATIVE  Repeat testing in 3-6 hrs  if clinically indicated. >0.05 ng/mL: POTENTIAL  MYOCARDIAL INJURY. Repeat  testing in 3-6 hrs if  clinically indicated. NOTE: An increase or decrease  of 30% or more on serial  testing suggests a  clinically important change)  CK, Total  1450  CPK-MB, Serum  16.9 (Result(s) reported on 18 Oct 2014 at 04:14PM.)    18:55   Troponin I  0.14 (0.00-0.05 0.05 ng/mL or less: NEGATIVE  Repeat testing in 3-6 hrs  if clinically indicated. >0.05 ng/mL: POTENTIAL  MYOCARDIAL INJURY. Repeat  testing in 3-6 hrs if  clinically indicated. NOTE: An increase or decrease  of 30% or more on serial  testing suggests a  clinically important change)    23:16   CK, Total  1276  CPK-MB, Serum  15.8 (Result(s) reported on 19 Oct 2014 at 12:42AM.)    23:17   Troponin I  0.11 (0.00-0.05 0.05 ng/mL or less: NEGATIVE  Repeat testing in 3-6 hrs  if clinically indicated. >0.05 ng/mL: POTENTIAL  MYOCARDIAL INJURY. Repeat  testing in 3-6 hrs if  clinically indicated. NOTE: An increase or decrease  of 30% or more on serial  testing suggests a  clinically important change)  Routine Hem:  28-Dec-15 15:04   WBC (CBC) 10.2  RBC (CBC) 5.30  Hemoglobin (CBC) 14.3  Hematocrit (CBC) 45.0  Platelet Count (CBC) 265  MCV 85  MCH 27.0  MCHC  31.8  RDW  15.5  Neutrophil % 83.0  Lymphocyte % 7.3  Monocyte % 7.7  Eosinophil % 1.0  Basophil % 1.0  Neutrophil #  8.5  Lymphocyte #  0.7  Monocyte # 0.8  Eosinophil # 0.1  Basophil # 0.1 (Result(s) reported on 18 Oct 2014 at 03:35PM.)  30-Dec-15 03:56   WBC (CBC)  20.0  RBC (CBC) 5.23  Hemoglobin (CBC) 14.1  Hematocrit (CBC) 44.1  Platelet Count (CBC) 300  MCV 84  MCH 26.9  MCHC  31.9  RDW  15.4   Neutrophil % 90.6  Lymphocyte % 4.2  Monocyte % 5.0  Eosinophil % 0.1  Basophil % 0.1  Neutrophil #  18.2  Lymphocyte #  0.8  Monocyte # 1.0  Eosinophil # 0.0  Basophil # 0.0 (Result(s) reported on 20 Oct 2014 at 05:13AM.)  Assessment/Plan:  Assessment/Plan:  Assessment IMP  shortness of breath  respiratory failure  hypoxemia  bronchitis  obesity  obstructive sleep apnea  edema  atrial fibrillation  hypertension  elevated troponin  diabetes  hyperlipidemia  gout .   Plan PLAN  continue supplemental oxygen  agree with antibiotic therapy to help bronchitis symptoms  continue inhalers to help with wheezing  recommend weight loss excise portion control  continue CPAP therapy for obstructive sleep apnea  telemetry for atrial fibrillation  continue Pradaxa for anticoagulation for AFib  rate control diltiazem  gout continue  allopurinol Indocin colcrys  edema possibly related to diltiazem as well as follows congestion recommend low-dose diuretic therapy as well as CPAP for obstructive           sleep apnea  have the patient follow up with primary physician as an outpatient cardiologist one to two weeks  probable demand ischemia angina recommended direct evaluation or invasive studies for a mildly elevated troponin   Electronic Signatures: Lujean Amel D (MD)  (Signed 24-Jan-16 09:40)  Authored: Chief Complaint, VITAL SIGNS/ANCILLARY NOTES, Brief Assessment, Lab Results, Assessment/Plan   Last Updated: 24-Jan-16 09:40 by Lujean Amel D (MD)

## 2015-02-20 NOTE — H&P (Signed)
PATIENT NAME:  Jonathan [name], Jonathan [name] MR#:  [id number] DATE OF BIRTH:  [birth year]  DATE OF ADMISSION:  02/07/2015  PRIMARY CARE PHYSICIAN: Dennison Mascot, MD.   PRIMARY INFECTIOUS DISEASE: Stann Mainland. Sampson Goon, MD  PRIMARY PULMONOLOGIST: Clenton Pare. Meredeth Ide, MD   REFERRING EMERGENCY ROOM PHYSICIAN: Su Ley, MD   CHIEF COMPLAINT: Shortness of breath.   HISTORY OF PRESENT ILLNESS: The patient is a 54 year old African American male, presenting to the ED with a chief complaint of shortness of breath for the past 2 days. The patient recently was admitted to the hospital and was diagnosed with recurrent pneumonia and was sent home without IV antibiotics. The patient was getting treatment for active tuberculosis at healthcare department. Today, the patient went to see [facility] for his medications on Monday and follow-up appointment, and he was found to be short of breath. The patient is sent over from healthcare department to the hospital because he was short of breath. The patient was hypoxic at 88% on 5 liters and saturating at 96% on 6 liters. He is resting comfortably, but with minimal exertion, he is becoming short of breath. When talking also, the patient is becoming short of breath. According to the healthcare department notes, the patient's recent 2 AFB smears are negative, and he does not need to be on isolation. Chest x-ray, PA and lateral views in the ED, has revealed bilateral lung opacities, which are slightly improved, compared to the prior exam, and they are not quite sure whether it represents pneumonia or atelectasis, as it has mild bilateral pleural effusions. Hospitalist team is called to admit the patient. The patient was given IV antibiotics in the ED. During my examination, the patient is just complaining of shortness of breath and coughing, but denies any chest pain or tightness.   PAST MEDICAL HISTORY: Active tuberculosis, but recent 2 AFB stains are  negative; history of COPD; history of paroxysmal atrial fibrillation on Coumadin, discontinued his Pradaxa . Also has a history of diastolic congestive heart failure, hypertension, diabetes mellitus, gout, hyperlipidemia, morbid obesity, obstructive sleep apnea, recurrent cavitary pneumonia since December 2015.   PAST SURGICAL HISTORY: None.   ALLERGIES: No known drug allergies.   PSYCHOSOCIAL HISTORY: Lives at home with wife. No history of smoking, alcohol, or illicit drug usage.  FAMILY HISTORY: Hypertension runs in his family.   REVIEW OF SYSTEMS:  CONSTITUTIONAL: Denies fever but complaining of fatigue and weakness.  EYES: Denies blurry vision, double vision.  EARS, NOSE, AND THROAT: Denies epistaxis, discharge.  RESPIRATORY: Complaining of cough and shortness of breath with chest tightness. Has chronic history of COPD, obstructive sleep apnea.  CARDIOVASCULAR: No chest pain, palpitations.   GASTROINTESTINAL: Denies nausea, vomiting, diarrhea, abdominal pain.  GENITOURINARY: No dysuria or hematuria.  ENDOCRINE: Denies polyuria or nocturia. Has history of diabetes mellitus.  HEMATOLOGIC AND LYMPHATIC: No anemia, easy bruising, bleeding.  INTEGUMENTARY: No acne, rash, lesions.  MUSCULOSKELETAL: No joint pain in the neck and back. Has a history of gout.  NEUROLOGIC: Denies any vertigo, ataxia. PYSCHIATRIC: No ADD or OCD.   PHYSICAL EXAMINATION: VITAL SIGNS: Temperature 98.5, pulse 99, respirations 34, blood pressure 121/69, pulse oximetry 95% to 96%.  GENERAL APPEARANCE: Not in acute distress, but becoming short of breath with minimal exertion while talking.  HEENT: Normocephalic, atraumatic. Pupils are equally reacting to light and accommodation. No scleral icterus. No conjunctival injection. No sinus tenderness, moist mucous membranes.  NECK: Supple. No JVD. No thyromegaly. Range of motion is intact.  LUNGS: Positive crackles and decreased breath sounds.  CARDIOVASCULAR: S1, S2  normal. Regular rate and rhythm.  GENITOURINARY: Abdomen soft. Bowel sounds are positive in all 4 quadrants. Nontender, nondistended. No masses felt.  NEUROLOGIC: Awake, alert, oriented x 3. Cranial nerves II through XII are grossly intact. Reflexes are 2+, following verbal commands.  EXTREMITIES: No cyanosis. No clubbing.   LABORATORIES AND IMAGING STUDIES: Chest x-ray, PA and lateral views: Bilateral lung opacities are noted, which are slightly improved compared to the prior exam. It is uncertain if this represents pneumonia, atelectasis, or edema. Mild bilateral pleural effusions are noted. Stable cardiomegaly is noted.   Troponin 0.03. WBC 8.3, hemoglobin 12.6, hematocrit 38.9, platelets are 278,000. PT 19.1, INR 1.6, BNP 222, glucose 102, BUN 11, creatinine 0.91, sodium 135, potassium 4.1, chloride 98, CO2 of 30. GFR greater than 60. Anion gap 7. Serum calcium is 8.4.   CAT scan of the chest is ordered, which is pending at this time.   Twelve-lead EKG: Normal sinus rhythm with tachycardia at 93 beats per minute with occasional PVCs, nonspecific T-wave abnormality, normal PR interval,   ASSESSMENT AND PLAN: A 54 year old obese African American male with a history of active tuberculosis and recurrent pneumonia since December 2015 had been to healthcare department at [facility] for follow-up appointment and was found to be very short of breath and hypoxic and was sent over to the Emergency Department. According to healthcare department note, recent 2 AFB smears were negative, and he does not need to be on isolation precautions. Chest x-ray, two views has revealed pneumonia with atelectasis with mild pleural effusions.  1. Acute hypoxic respiratory failure secondary to recurrent pneumonia, which is healthcare associated, with underlying active tuberculosis and obstructive sleep apnea. We will admit him to CCU stepdown, continue 6 liters of oxygen via nasal cannula. If necessary, we will put him  on BiPAP. We will start him on IV antibiotics of Zosyn 3.375 q. 8 hours, vancomycin  IV q. 12 hours, and levofloxacin. A CT chest is ordered. A consult is placed to Dr. Sampson Goon. A call placed to call back and also pulmonary consult is placed to Dr. Meredeth Ide. The patient might be benefited  with a bronchoscopy to get bronchial washings. We will obtain sputum culture and sensitivity.  2. Active tuberculosis. We will resume his tuberculosis medications, isoniazid, rifampin, ethambutol, pyrazinamide, and pyridoxine.  3. Chronic history of chronic obstructive pulmonary disease not in exacerbation. We will provide him nebulizer treatments, and if necessary, we will start him on IV steroids.  4. Chronic history of atrial fibrillation, rate controlled on Coumadin. INR is subtherapeutic. We will put him on Lovenox subcutaneous and we will resume his Coumadin. Pharmacy to dose his Coumadin. We will monitor daily PT/INRs. Right now, he is rate controlled. We will resume his home medications, Cardizem and metoprolol.  5. History of obstructive sleep apnea. Continue home oxygen via nasal cannula and CPAP at bedtime.  6. Hypertension. Continue his home medication, Cardizem and metoprolol, and up titrate as needed basis.  7. History of interstitial lung disease. Follow up with pulmonology. A pulmonary consult is placed to Dr. Meredeth Ide. CT chest is pending at this time.  8. We will provide him gastrointestinal prophylaxis and deep vein thrombosis prophylaxis with Lovenox, as his INR is subtherapeutic at this point of time.   CODE STATUS: He is a full code. Wife is the medical power of attorney.   Plan of care discussed in detail with the patient. He verbalized  understanding of the plan.   TOTAL CRITICAL CARE TIME SPENT: 50 minutes     ____________________________ Ramonita LabAruna Donley Harland, MD ag:mw D: 02/07/2015 16:24:54 ET T: 02/07/2015 17:48:31 ET JOB#: [id number]  cc: Ramonita LabAruna Wetzel Meester, MD, <Dictator> Ramonita LabARUNA Ahmiyah Coil  MD ELECTRONICALLY SIGNED 02/12/2015 17:57

## 2015-02-20 NOTE — H&P (Signed)
PATIENT NAME:  Jonathan [name], Jonathan [name] MR#:  [id number] DATE OF BIRTH:  05-25-1961  DATE OF ADMISSION:  01/05/2015  PRIMARY CARE PHYSICIAN: Dr. Burnadette Pop  PULMONOLOGY: Herbon E. Meredeth Ide, MD  INFECTIOUS DISEASE: Stann Mainland. Sampson Goon, MD  CHIEF COMPLAINT: Shortness of breath, cough.   HISTORY OF PRESENT ILLNESS: A 54 year old African American male patient with a history of chronic atrial fibrillation on Pradaxa, diastolic congestive heart failure, hypertension, who has being dealing with recurrent pneumonia over the past 3 months, presents to the hospital directly from his PCP office after he was noticed to desaturate to 81% on 4 liters oxygen.   The patient was recently in the hospital, admitted from 12/19/2014 and discharged on 12/28/2014 after being treated for bilateral pneumonia along with cavitary lesions on Augmentin and Bactrim. The patient mentions that he was not able to return to work. Has not improved Continued to use 4 to 5 liters of oxygen. Worsening shortness of breath. He did follow up with Dr. Meredeth Ide and Dr. Sampson Goon in between.   Presently, the patient has significant tachypnea on 5 liters of oxygen. Does not complain of any orthopnea. Does have chronic lower extremity edema. Mentions that his appetite is normal. No diarrhea, no vomiting. No abdominal pain.    PAST MEDICAL HISTORY:  1. Diastolic congestive heart failure.  2. Hypertension.  3. Diabetes mellitus.  4. Chronic atrial fibrillation on Pradaxa. 5. Gout.  6. Hyperlipidemia.  7. Morbid obesity.  8. Sleep apnea.  9. Recurrent cavitary pneumonia since December of 2015.   ALLERGIES: NONE.   SOCIAL HISTORY: The patient does not smoke. Drinks occasionally. No drug use. Works as a Naval architect.   CODE STATUS: Full code.   FAMILY HISTORY: Hypertension.   REVIEW OF SYSTEMS: See history of present illness. The rest of systems reviewed and negative.   HOME MEDICATIONS:  1. Albuterol nebulizer every 4 hours as needed.   2. Allopurinol 300 mg once a day.  3. Augmentin 875 oral 2 times a day.  4. Digoxin 250 mcg daily.  5. Diltiazem 300 mg daily.  6. Lasix 40 mg daily.  7. Lovastatin 20 mg daily.  8. Metoprolol tartrate 50 mg 2 times a day.  9. Pradaxa 150 mg oral 2 times a day.  10. Bactrim 800/160 oral 3 times a day.   ALLERGIES: NO DRUG ALLERGIES.   PHYSICAL EXAMINATION:  VITAL SIGNS: Temperature 97.8, pulse 100, respirations 26, blood pressure 117/71, saturating 92% on 5 liters of oxygen.  GENERAL: Morbidly obese African American male patient lying in bed in significant respiratory distress.  PSYCHIATRIC: Alert and oriented x3, anxious.  HEENT: Atraumatic, normocephalic. Oral mucosa moist and pink  nose normal. No pallor. No icterus. Pupils bilaterally equal and reactive to light.  NECK: Supple. No thyromegaly. No palpable lymph nodes.  CARDIOVASCULAR: S1, S2, tachycardic. No murmurs, has lower extremity edema.  RESPIRATORY: Has increased work of breathing with bilateral wheezing, crackles.  GASTROINTESTINAL: Soft abdomen, nontender.  GENITOURINARY: No CVA tenderness or bladder distention.  SKIN: Warm and dry. No petechiae, rash, ulcers.  MUSCULOSKELETAL: No joint swelling, redness, effusion of the large joints. Normal muscle tone.  NEUROLOGICAL: Motor strength 5/5 in upper extremities.   LABORATORY STUDIES: Glucose of 95, BUN 16, creatinine 1.07, sodium 135, potassium 4.8, chloride 98. AST, ALT, alkaline phosphatase, bilirubin normal.   WBC 10.8, hemoglobin of 12, platelets of 313,000.   ABG shows pH of 7.4 with pCO2 of 49, pO2 of 78.   EKG shows sinus tachycardia, nothing acute.  Chest x-ray PA and lateral, shows similar cavitary lesions with air fluid level, possible abscess off his old previous cavity, along with bilateral infiltrates.   ASSESSMENT AND PLAN:  1. Bilateral pneumonia with cavitary lesions with possible abscess on the chest x-ray. At this point, we will broaden his  antibiotic coverage to vancomycin, Zosyn and azithromycin send for sputum cultures. The patient does not seem to be improving with his outpatient antibiotic therapy. His recent bronchoscopy and cultures were negative. At this point, I have discussed the case with Dr. Meredeth IdeFleming. We will get a CT scan of the chest with contrast. We will also consult infectious disease, Dr. Sampson GoonFitzgerald. The patient will be placed on BiPAP, for acute on chronic respiratory failure. Possibility of reflux due to patient's hiatal hernia.   2. Paroxysmal atrial fibrillation on Pradaxa. At this point, the patient is in normal sinus rhythm. Has some sinus tachycardia secondary to respiratory failure. We will continue Cardizem. We will hold Pradaxa for now in case he needs any further procedures.  3. Hypertension. Continue medication.  4. Obesity, sleep apnea. Continue CPAP at night.    CODE STATUS: Full code.   Time Spent in critical care time on this the patient being placed on BiPAP with acute on chronic respiratory failure was 40 minutes.    ____________________________ Molinda BailiffSrikar R. Edwar Coe, MD srs:ap D: 01/05/2015 16:44:59 ET T: 01/05/2015 18:06:46 ET JOB#: [id number]  cc: Wardell HeathSrikar R. Marvin Maenza, MD, <Dictator> Herbon E. Meredeth IdeFleming, MD Stann Mainlandavid P. Sampson GoonFitzgerald, MD unknown cc Orie FishermanSRIKAR R Morio Widen MD ELECTRONICALLY SIGNED 01/11/2015 2:24

## 2015-02-20 NOTE — Consult Note (Signed)
PATIENT NAME:  Jonathan PaceCLIFTON, Jamai D MR#:  [id number] DATE OF BIRTH:  [birth year]  DATE OF CONSULTATION:    CONSULTING PHYSICIAN:  Stann Mainlandavid P. Sampson GoonFitzgerald, MD  REASON FOR CONSULTATION:  Multifocal pneumonia.   REQUESTING PHYSICIAN:  Dr. Alford Highlandichard Wieting.   HISTORY OF PRESENT ILLNESS: This is a 54 year old gentleman with history of morbid obesity, admitted 02/28 with shortness of breath, cough, and chills. He also has a history of type 2 diabetes, hypertension, chronic atrial fibrillation, on Pradaxa. He was admitted in December with findings of pneumonia. The patient states that he has been off and on sick since then. He has been off and on antibiotics as well as prednisone. He came to the Emergency Room with chills, shortness or breath and cough.  CT scan was done, which showed patchy airspace disease and a mass-like consolidation. The patient has since been admitted and treated with broad antibiotics including vancomycin and Zosyn. He underwent a bronchoscopy by Dr. Belia HemanKasa on March 4 with findings of extensive purulence from the right upper lobe and left upper lobe.   Currently, the patient reports feeling better. He is quite dyspneic, however. He remains on 5 liters.   PAST MEDICAL HISTORY:  1.  Atrial fibrillation.  2.  Gout.  3.  Hyperlipidemia.  4.  Hypertension.  5.  Obesity.  6.  Sleep apnea on CPAP.   PAST SURGICAL HISTORY: None.   SOCIAL HISTORY: He does not smoke, drinks occasionally, no drug use. He works as a Naval architecttruck driver.   FAMILY HISTORY: Positive for hypertension.   ANTIBIOTICS SINCE ADMISSION:  Include vancomycin and Zosyn.   REVIEW OF SYSTEMS:   Eleven systems reviewed and negative except as per HPI.   PHYSICAL EXAMINATION:  VITAL SIGNS: Temperature overnight 101.7, currently 97.9, pulse 95, blood pressure 101/60, respirations 18, and saturation 93% on 4 liters.  GENERAL: He is obese. He is somewhat dyspneic. He has a flat affect.  HEENT: Pupils are reactive. Sclerae anicteric.   OROPHARYNX: Clear with no thrush.  NECK: Supple.  HEART: Regular but very distant.  LUNGS: Have rhonchi bilaterally.  ABDOMEN: Soft, obese and mildly distended.  EXTREMITIES: 1+ edema, bilateral lower extremities.   LABORATORY DATA: White blood count on admission was 9.4 on February 28, hemoglobin 13.9, platelets 319,000. Sedimentation rate was 18. Troponins were borderline positive at 0.11. LFTs showed elevated AST and ALT of 166 and 167, T bilirubin was 0.7. Albumin low at 1.7. Renal function shows a creatinine of 1.40.   HIV test done today is negative. Clostridium difficile testing was negative.  Pneumonitis panel was negative. Legionella urine antigen was negative. Hepatitis A, B, and C testing was negative.   IMAGING:  CT of his chest done February 22 and repeated February 28 revealed significant interval progression of peribronchovascular consolidation and patchy airspace disease throughout the inferior right upper lobe and right lower lobe. Additionally, there had been mass-like consolidation margin of the large cavity lesion in the left upper lobe. Overall findings are concerning for progression of an aggressive multilobar pneumonia. There was borderline mediastinal adenopathy.  There was a patch of the distal thoracic esophagus with small volume internal debris. There was an enlarged main and central pulmonary artery suggesting pulmonary arterial hypertension.   IMPRESSION: A 54 year old gentleman with history of morbid obesity and obstructive sleep apnea on CPAP admitted with progressive multifocal pneumonia. Unfortunately, BAL cultures are negative. This is likely because he was on vancomycin and Zosyn at this time. He did have a lot of purulence  from the upper lobes. I suspect he may be at risk of aspiration with his obesity and CPAP and the finding of some fluid in his esophagus on his CT.    RECOMMENDATIONS:  1.  I would treat him for aspiration as well as possible MRSA pneumonia  despite negative cultures. With this I would use Augmentin 875 twice a day as well as Bactrim double strength twice a day for a total 21 day course from the date of bronchoscopy.  2.  I would suggest patient elevate the head of the bed, when he is using CPAP.  3.  If this recurs or continues he would likely benefit from swallow evaluation and potentially an EGD.   Thank you for the consult. I will be glad to follow with you.    ____________________________ Stann Mainland. Sampson Goon, MD dpf:at D: 12/27/2014 16:27:16 ET T: 12/27/2014 17:21:20 ET JOB#: [id number]  cc: Stann Mainland. Sampson Goon, MD, <Dictator> Anjeli Casad Sampson Goon MD ELECTRONICALLY SIGNED 12/29/2014 7:13

## 2015-02-20 NOTE — Discharge Summary (Signed)
PATIENT NAME:  Jonathan [name], Jonathan [name] MR#:  [id number] DATE OF BIRTH:  1961/07/01  DATE OF ADMISSION:  01/05/2015 DATE OF DISCHARGE:  01/07/2015  DISCHARGE DIAGNOSES:  1.  Recurrent pneumonia, failed outpatient treatment.  2.  Cavitary lesion.  3.  Atrial fibrillation.   CONDITION ON DISCHARGE: Stable.   MEDICATIONS ON DISCHARGE:  1. Lovastatin 20 mg once a day.  2. Pradaxa 150 mg oral capsule 2 times a day  3. Digoxin 250 mcg oral tablet once a day.  4. Allopurinol 300 mg oral tablet once a day.  5. Metoprolol 50 mg 2 times a day.  6. Cardizem 300 mg 24-hour extended release capsule once a day.  7. Furosemide 40 mg oral once a day.  8. Albuterol inhalation solution 3 mL nebulizer treatment as needed for shortness of breath.  9. Prednisone 10 mg tablet start at 60 and taper x 10 mg daily until complete.  10. Docusate sodium 100 mg oral capsule 2 times a day as needed for constipation.  11. Vancomycin 1250 mg IV every 12 hours for 21 days.  12. Zosyn and tazobactam 4.5 grams IV every 8 for 21 days.   HOME HEALTH ON DISCHARGE: Yes. Advised to have home health nurse and routine PICC line care by home health nurse.   DISCHARGE INSTRUCTIONS: The patient was advised to 4 L nasal cannula oxygen on discharge and low-sodium diet and follow up with pulmonary clinic in 1-2 weeks and swallow evaluation clinic in 1-2 weeks, and 2-4 weeks with Dr. Sampson Goon in the office.   HISTORY OF PRESENT ILLNESS: A 54 year old African American the patient with history of chronic atrial fibrillation on Pradaxa, diastolic congestive heart failure, hypertension, was dealing with recurrent pneumonia over 3 months, presented to hospital from PCP's office after he was noticed to be desaturated to 81% on 4 L oxygen. The patient was recently admitted to hospital on the February 28 and discharged on March 8 after treatment for bilateral pneumonia along with cavitary lesion on Augmentin and Bactrim, was not able to return to  work, not improved. Continued to use 4-5 L of oxygen and worsening shortness of breath. Did follow up with Dr. Meredeth Ide and Dr. Sampson Goon in between. In ER he was on 5 L oxygen, but no complaint of orthopnea, had some lower extremity edema.   HOSPITAL COURSE AND STAY:  1. Multilobar pneumonia and cavitary lesion. He was initially started on IV antibiotics. PICC line was placed as per ID consult. Dr. Meredeth Ide saw the patient, as per him there was no need to drain the cavitary lesion and the patient can go home if he is feeling better. The patient started feeling better, so we discharged him home, as per the IV antibiotic recommendations by Dr. Sampson Goon.  2. Acute on chronic respiratory failure due to pneumonia and underlying interstitial lung disease. He was on 4 L oxygen at home and CPAP at night. We continued the same.  3. Paroxysmal atrial fibrillation on rate control medication, Cardizem and Pradaxa.  4. Hypertension. It was stable in the hospital.  5. Obesity and sleep apnea. He was on CPAP.   CONSULTS IN THE HOSPITAL:  1.  Pulmonary consult with Dr. Meredeth Ide.  2.  Infectious disease consult with Dr. Dierdre Harness.   IMPORTANT LABORATORY RESULTS IN THE HOSPITAL: WBC count on presentation 10.8, hemoglobin 12, platelet count is 313,000, and MCV is 81. Glucose level 95, BUN 16, creatinine 1.07. Chest x-ray, PA and lateral, showed continued bilateral lung abnormality appearing  very similar to that on 02/28, multilobar bilateral consolidation and a 10 mm cavitary lesion in the left lung with small fluid level suspicious of developing lung abscess. On ABG pH was 7.4, pCO2 of 49, and pO2 of 78 on 40% FiO2 on nasal cannula.   TOTAL TIME SPENT ON THIS DISCHARGE: 40 minutes. ____________________________ [name], MD vgv:bm D: 01/11/2015 17:45:00 ET T: 01/12/2015 03:42:52 ET  JOB#: [id number]  cc: Stann Mainlandavid P. Sampson GoonFitzgerald, MD Herbon E. Meredeth IdeFleming, MD Dennison MascotLemont Morrisey, MD Heath GoldVAIBHAVKUMAR  [facility]ANI MD ELECTRONICALLY SIGNED 01/27/2015 0:20

## 2015-02-20 NOTE — Consult Note (Signed)
Brief Consult Note: Diagnosis: Multilobar PNA, cultures negative, possible aspiration.   Patient was seen by consultant.   Consult note dictated.   Recommend to proceed with surgery or procedure.   Recommend further assessment or treatment.   Orders entered.   Discussed with Attending MD.   Comments: Cultures area all negative.  Unclear etiology. Would treat for aspiration with augmentin 875 bid for 21 days as well as bactrim ds bid for 21 days CHeck HIV test - discussed with patient.  Electronic Signatures: Dierdre HarnessFitzgerald, Joselinne Lawal Patrick (MD)  (Signed 07-Mar-16 13:58)  Authored: Brief Consult Note   Last Updated: 07-Mar-16 13:58 by Dierdre HarnessFitzgerald, Hassel Uphoff Patrick (MD)

## 2015-02-20 NOTE — Consult Note (Signed)
PATIENT NAME:  Jonathan [name] MR#:  [name] DATE OF BIRTH:  May 19, 1961  DATE OF CONSULTATION:  01/05/2015  REFERRING PHYSICIAN:   CONSULTING PHYSICIAN:  Stann Mainlandavid P. Sampson GoonFitzgerald, MD  REQUESTING PHYSICIAN:  Milagros LollSrikar Sudini, MD.    REASON FOR CONSULTATION:  Worsening pneumonia.   HISTORY OF PRESENT ILLNESS: A 54 year old gentleman with history of morbid obesity as well as diabetes, hypertension, chronic atrial fibrillation, and obstructive sleep apnea on CPAP. He was initially admitted on February 28 with shortness of breath, cough, and chills. He has also been admitted prior in December with findings of pneumonia. The patient has been treated extensively with antibiotics. At his last admission he had a CT scan that showed patchy airspace disease as well as masslike consolidation that progressed to what appeared to be an abscess. He had a bronchoscopy done with routine AFB and fungal cultures all negative to date. The bronchoscopy was on March 4 by Dr. Belia HemanKasa and showed extensive purulence from the right upper lobe and left lower lobe. The patient was discharged on March 8 on Augmentin and Bactrim. He reports taking it, however he has continued to have productive cough, subjective fevers, increasing O2 requirement. He was seen by Dr. Meredeth IdeFleming who did a chest x-ray that shows new air fluid level in his cavitary lesions. He is admitted for further evaluation.   PAST MEDICAL HISTORY:  1. Atrial fibrillation.  2. Gout.  3. Hyperlipidemia.  4. Hypertension.  5. Morbid obesity.  6. Sleep apnea on CPAP as above.   PAST SURGICAL HISTORY: None.   SOCIAL HISTORY: Does not smoke. He drinks occasionally. He works as a Naval architecttruck driver. HIV test was negative at his last admission.   FAMILY HISTORY: Positive for hypertension.   ANTIBIOTICS: Currently he is on vancomycin, Zosyn, and azithromycin. He has recently been on Augmentin and Bactrim.   REVIEW OF SYSTEMS:  Unable to be obtained as the patient is on CPAP  and unable to cooperate.   PHYSICAL EXAMINATION:  VITAL SIGNS: Temperature 98.4, pulse 100, blood pressure 149/64, respirations 22, saturation 94% on 4 liters, he has got BiPAP on at this time.  HEENT: Pupils are reactive.  NECK: Supple.  HEART: Regular.  LUNGS: Coarse crackles throughout and  mild wheeze.  ABDOMEN: Obese, soft, nontender.  EXTREMITIES: No clubbing, cyanosis, or edema.  NEUROLOGIC: He is alert and oriented, grossly nonfocal neurologic exam.   LABORATORY DATA: White blood count March 16 is 10.8, hemoglobin 12.0, platelets 313,000, differential shows 8.7 neutrophils. Renal function is normal. LFTs normal except albumin low at 2.4. HIV test March 6 was negative. BAL culture March 4 had few white blood cells but no organisms. Fungal culture from the BAL was negative and AFB smear negative, culture pending. Clostridium difficile was negative March 2. Bronchial washing showed negative for malignant cells, but pulmonary macrophages were present. Legionella urine antigen March 2 was negative. Pneumonitis panel hypersensitivity testing from February 28 was negative.  Hepatitis A, B, and C testing was negative.   IMAGING: CT of the chest done February 28 revealed significant interval progression of peribronchovascular consolidation and patchy airspace disease throughout the inferior right upper lobe and right lower lobe, marked increase masslike consolidation at the superior margin of the large cavity lesion in the left upper lobe concerning for aggressive multilobar pneumonia. There was a patulous distal thoracic esophagus with small volume internal debris. Chest x-ray done March 16 showed continued bilateral lung abnormality. Multilobar bilateral consolidation and 8 cm cavitary lesion in the left  lung with a small fluid level suspicious for developing lung abscess.   IMPRESSION: A 54 year old gentleman with morbid obesity, obstructive sleep apnea on CPAP,  HIV negative, with recurrent  progressive pneumonia and a lung abscess cavity. He has failed outpatient antibiotics with Augmentin and Bactrim. All cultures including bronchoalveolar lavage, AFB, routine fungal from March 4 bronchoscopy were negative, although a lot of purulence was noted. I am still concerned for aspiration especially while he is on CPAP and is lying flat.   RECOMMENDATIONS:  1.  Continue vancomycin, Zosyn, and azithromycin for now.   2.  Agree with considering placement of catheter drainage into the cavitary lesion. Repeat CT scan is pending.  3.  He likely will need a PICC line and IV antibiotics for several weeks to help clear this. 4.  Would suggest speech and swallow evaluation for consideration of aspiration.   Thank you for the consult. I will be glad to follow with you.      ____________________________ Stann Mainland. Sampson Goon, MD dpf:bu [name]: 01/05/2015 16:31:04 ET T: 01/05/2015 17:20:53 ET JOB#: [id number]  cc: Stann Mainland. Sampson Goon, MD, <Dictator> Harlean Regula Sampson Goon MD ELECTRONICALLY SIGNED 01/13/2015 22:10

## 2015-02-20 NOTE — Consult Note (Signed)
PATIENT NAME:  Jonathan [name], Jonathan [name] MR#:  [id number] DATE OF BIRTH:  July 08, 1961  DATE OF CONSULTATION:  12/23/2014  REFERRING PHYSICIAN:   CONSULTING PHYSICIAN:  Marcina Millard, MD  PRIMARY CARE PHYSICIAN:  Marisue Ivan, MD.    CARDIOLOGIST:  Marcina Millard, MD.    CHIEF COMPLAINT: Shortness of breath.   REASON FOR CONSULTATION: Consultation requested for evaluation of elevated troponin prior to bronchoscopy.   HISTORY OF PRESENT ILLNESS: The patient is a 54 year old male with history of hypertension, type 2 diabetes, chronic atrial fibrillation, who was admitted on 12/19/2014 with worsening shortness of breath and right upper and right lower lobe pneumonia with large cavitary lesion in the left upper lobe. The patient has been evaluated by Dr. Meredeth Ide who has recommended a bronchoscopy to rule out possible malignancy. The patient has been seen by anesthesia who recommends preoperative cardiovascular evaluation because of borderline elevated troponin. The patient has undergone previous cardiac catheterization on 09/20/2014, which revealed normal coronary anatomy and normal left ventricular function. Borderline elevated troponin is very likely due to demand/supply ischemia and not due to acute coronary syndrome in light of findings of cardiac catheterization. The patient has chronic atrial fibrillation with mild elevation in heart rate likely due to underlying pneumonia and respiratory failure.   PAST MEDICAL HISTORY:  1.  Normal coronary anatomy and normal left ventricular function by cardiac catheterization on 09/20/2014.  2.  Chronic atrial fibrillation on Pradaxa for stroke prevention.  3.  Hypertension.  4.  Hyperlipidemia.  5.  Type 2 diabetes.  6.  Obesity.   7.  Sleep apnea, on CPAP.   MEDICATIONS: Pradaxa 150 mg b.i.d., Lanoxin 0.25 mg daily, losartan 50 mg daily, metoprolol tartrate 50 mg b.i.d., diltiazem ER 180 mg daily, albuterol inhaler 2 puffs q. 6 hours p.r.n.,  allopurinol 300 mg daily, colchicine 0.6 mg daily, Benadryl 25 mg daily p.r.n., fluticasone/vilanterol inhaler 1 inhalation daily,  indomethacin 50 mg t.i.d. with meals, lovastatin 20 mg daily, tadalafil 20 mg daily p.r.n.    SOCIAL HISTORY: The patient is married. He has never smoked.    FAMILY HISTORY: No immediate family history of coronary artery disease or myocardial infarction.   REVIEW OF SYSTEMS:  CONSTITUTIONAL: The patient has had some mild fever and chills.  EYES: No blurry vision.  EARS: No hearing loss.  RESPIRATORY: The patient has shortness of breath and nonproductive cough.  CARDIOVASCULAR: No chest pain or palpitations.  GASTROINTESTINAL: No nausea, vomiting, or diarrhea.  GENITOURINARY: No dysuria or hematuria.  ENDOCRINE: No polyuria or polydipsia.  MUSCULOSKELETAL: No arthralgias or myalgias.  NEUROLOGICAL: No focal muscle weakness or numbness.  PSYCHOLOGICAL: No depression or anxiety.   PHYSICAL EXAMINATION:   VITAL SIGNS: Blood pressure 120/76, pulse 91 and irregularly irregular, respirations 18, temperature 97.6, pulse oximetry 96%.  HEENT: Pupils equal, reactive to light and accommodation.  NECK: Supple without thyromegaly.  LUNGS: Decreased breath sounds in both bases.  HEART: Normal JVP. Normal PMI. Irregularly irregular rhythm. Normal S1, S2. No appreciable gallop, murmur, or rub.  ABDOMEN: Soft and nontender. Pulses were intact bilaterally.  MUSCULOSKELETAL: Normal muscle tone.  NEUROLOGIC: The patient is alert and oriented x 3. Motor and sensory both grossly intact.   IMPRESSION: A 54 year old gentleman who presents with respiratory failure, pneumonia, and large left cavitary mass lesion, possible malignancy, awaiting bronchoscopy. The patient referred for preoperative cardiovascular evaluation because of borderline elevated troponin. However, the patient has known normal coronary anatomy, normal left ventricular function by prior cardiac catheterization. The  patient has no chest pain. The borderline elevated troponin is very likely due to demand supply ischemia and not due to acute coronary syndrome especially in light of normal coronary anatomy by cardiac catheterization. The patient has chronic atrial fibrillation, rate mildly elevated due to underlying respiratory failure and pneumonia, but otherwise well controlled.   RECOMMENDATIONS:  1. Agree with current therapy.  2. Hold Pradaxa for 2 days prior to bronchoscopy.  3. Resume Pradaxa as soon as it is feasible and safe.   4. Continue metoprolol pre-, peri-, and post-procedure.   5. Defer further cardiac evaluation at this time.     ____________________________ Marcina MillardAlexander Odus Clasby, MD ap:bu D: 12/23/2014 16:50:00 ET T: 12/23/2014 20:20:24 ET JOB#: [id number]  cc: Marcina MillardAlexander Hennessy Bartel, MD, <Dictator> Marcina MillardALEXANDER Siddhant Hashemi MD ELECTRONICALLY SIGNED 12/28/2014 10:09

## 2015-02-20 NOTE — H&P (Signed)
PATIENT NAME:  Jonathan [name], Jonathan [name] MR#:  [id number] DATE OF BIRTH:  30-Apr-1961  DATE OF ADMISSION:  12/19/2014  PRIMARY CARE PROVIDER:  Marisue Ivan, MD   EMERGENCY DEPARTMENT REFERRING DOCTOR:  Charlestine Night. Scotty Court, MD  CHIEF COMPLAINT: Shortness of breath, cough, chills.   HISTORY OF PRESENT ILLNESS: The patient is a 54 year old African American male, morbidly obese, with history of hypertension, type 2 diabetes, chronic atrial fibrillation on Pradaxa, who has a history of having pneumonia in December 2015. The patient was hospitalized for a few days and then discharged home. He reports that got better and then again he got sick. He has been on antibiotics for the past few days per his primary care provider. The patient reports that despite taking the antibiotics, he is continuing to have shortness of breath, cough, and chills. The patient came to the ED with these symptoms and had a CT per PE protocol which showed interval progression of peribronchial vascular consolidation, patchy airspace disease throughout the inferior right upper lobe and right lower lobe. There is also a masslike consolidation at the superior margin of large cavitary lesion in the left upper lobe. The patient denies any chest pains. No nausea, vomiting, or diarrhea. Denies any weight gain.   PAST MEDICAL HISTORY: 1.  History of chronic atrial fibrillation on Pradaxa. 2.  Gout.  3.  Hypercholesterolemia. 4.  Listed as having type 2 diabetes. However, the patient reports that he is not a diabetic.  5.  Hypertension.  6.  Morbid obesity.  7.  Sleep apnea, on CPAP. 8.  Questionable history of diastolic CHF, although the patient's echocardiogram done in November 2015 showed normal EF. No diastolic dysfunction. He also had a cardiac catheterization which was negative.   ALLERGIES: None.   MEDICATIONS: Ventolin 2 puffs q. 4 p.r.n. as needed; Taztia XT 180 daily; Pradaxa 150, 1 tab  p.o. b.i.d.; metoprolol tartrate 50, 1 tab  p.o. b.i.d.; lovastatin 20 at bedtime; losartan 50 daily; doxycycline 100 mg 1 tab p.o. b.i.d.; digoxin 250 mcg 1 tablet p.o. daily; Colcrys 0.6 daily; clindamycin 300 mg 1 tab p.o. t.i.d.; Breo Ellipta 100 mg 1 puff daily; allopurinol 300 daily.   SOCIAL HISTORY: Denies smoking, drinks occasionally. No drug use.   FAMILY HISTORY: Positive for hypertension.   REVIEW OF SYSTEMS:   CONSTITUTIONAL: Complains of fatigue, weakness. No weight loss. No weight gain.  EYES: No blurred or double vision. No redness. No inflammation. No glaucoma. No cataracts.  EARS, NOSE, THROAT: No tinnitus. No ear pain. No hearing loss.   RESPIRATORY: Complains of cough, but no hemoptysis. Complains of shortness of breath.  CARDIOVASCULAR: Denies any chest pain, orthopnea, or edema. Has atrial fibrillation.  GASTROINTESTINAL: No nausea, vomiting, diarrhea. No abdominal pain. No hematemesis. No melena.  GENITOURINARY: Denies any dysuria, hematuria, renal colic, or frequency.  ENDOCRINE: Denies any polyuria, nocturia, thyroid problems.  HEMATOLOGIC AND LYMPHATIC: Denies anemia, easy bruisability, or bleeding.  SKIN: No acne. No rash.  MUSCULOSKELETAL: Denies any pain in the neck, back, or shoulder.  NEUROLOGIC: No CVA, TIA, or seizure.  PSYCHIATRIC: No anxiety, insomnia.   PHYSICAL EXAMINATION: VITAL SIGNS: Temperature 99.7, pulse 103, respirations 40, blood pressure 141/71.  GENERAL: The patient is a morbidly obese, African American male in no acute distress.  HEENT: Head atraumatic, normocephalic. Pupils equally round and react to light and accommodation. There is no conjunctival pallor. No scleral icterus. Nasal exam shows no drainage or ulceration. Oropharynx is clear without any exudate. NECK: Supple without  any thyromegaly.  CARDIOVASCULAR: Irregularly irregular heart rhythm. No murmurs, rubs, clicks, or gallops.  LUNGS: Has bilateral rhonchi. There are no crackles. No accessory muscle usage.  ABDOMEN: Soft,  nontender, nondistended. Positive bowel sounds x 4.  EXTREMITIES: No clubbing, cyanosis, or edema.  SKIN: No rash.   VASCULAR: Good DP, PT pulses.  PSYCHIATRIC: Not anxious or depressed.  NEUROLOGIC: Awake, alert, oriented x 3. No focal deficits.  LYMPH NODES: Nonpalpable.   LABORATORY DATA:  Glucose 123, BUN 18, creatinine 1.40, sodium 137, potassium 4.0, chloride 103, CO2 of 25, calcium 8.0. LFTs showed albumin 1.9, bilirubin total 0.7, alkaline phosphatase 101, AST is 166, ALT 155, CPK 631, CK-MB 4.2. Troponin 0.11. WBC 9.4, hemoglobin 13.9, platelet count 319,000.  INR is 1.3. PT is elevated at 16.3.  IMAGING:  CT scan of the chest for PE shows significant interval progression of peribronchial vascular consolidation, airspace disease. Concerning for progression or aggressive multilobar pneumonia, or less likely multilobar inflammatory process.   ASSESSMENT AND PLAN: The patient is a 54 year old African American male with history of pneumonia in the past, presents with shortness of breath, cough, noted to have multilobar pneumonia.  1.  Multilobar pneumonia. At this time, we will treat him with broad-spectrum antibiotics. Pulmonary consult for further differentials. The patient may need a bronchoscopy as well. We will try to obtain a sputum culture.  2.  Sleep apnea. Continue CPAP at bedtime.  3.  Atrial fibrillation. Continue metoprolol, digoxin, and Pradaxa. Heart rate currently controlled. Elevated cardiac enzymes, demand ischemia, recent cardiac   negative in December 2015.  4.  Questionable diabetes. We will check a hemoglobin A1c. The patient denies history of this.  5.  Hyperlipidemia. Continue lovastatin.  6. Elevated liver function tests, possibly fatty liver. We will check a hepatitis panel. Repeat liver function tests in the morning.  7.  Miscellaneous. The patient will be on Pradaxa for deep vein thrombosis prophylaxis.   TIME SPENT: 50 minutes on this  patient.  ____________________________ Lacie ScottsShreyang H. Allena KatzPatel, MD shp:LT D: 12/19/2014 13:38:00 ET T: 12/19/2014 16:35:19 ET JOB#: [id number] Sadat Sliwa H Manie Bealer MD ELECTRONICALLY SIGNED 12/24/2014 15:24

## 2015-02-20 NOTE — Consult Note (Addendum)
PATIENT NAME:  Jonathan [name], Jonathan [name] MR#:  [id number] DATE OF BIRTH:  [birth year]  DATE OF CONSULTATION:  02/10/2015  REFERRING PHYSICIAN:  [place] CONSULTING PHYSICIAN:  Joselyn Arrow, NP  CONSULTING GASTROENTEROLOGIST:  Midge Minium, M.D.    PRIMARY CARE PHYSICIAN:  Dennison Mascot, M.D.  PRIMARY INFECTIOUS DISEASE PHYSICIAN:  Stann Mainland. Sampson Goon, M.D.   PRIMARY PULMONOLOGIST:  Herbon E. Meredeth Ide, M.D.   REASON FOR CONSULTATION:  GERD, questionable silent aspiration.  HISTORY OF PRESENT ILLNESS: Jonathan [name] is a 54 year old male who presented to the hospital with shortness of breath. He has been admitted multiple times with recurrent cavitary necrotizing pneumonia. He has had persistent pneumonitis. He is on TB therapy under the direction of Dr. Sampson Goon. He has COPD as well. He is on Coumadin for atrial fibrillation. His Hemoccult was negative. His INR is 1.7.  His white blood cell count is 12.9 and his hemoglobin is 12.2. He denies any dysphagia, odynophagia, heartburn, indigestion, nausea, vomiting, weight loss, or melena. He denies any constipation or diarrhea. Denies any abdominal pain. Denies any history of acid reflux.   PAST MEDICAL AND SURGICAL HISTORY: Active tuberculosis, COPD, paroxysmal atrial fibrillation on Coumadin, diastolic congestive heart failure, hypertension, diabetes mellitus, gout, hyperlipidemia, morbid obesity, obstructive sleep apnea, recurrent cavitary pneumonia since December 2015. He had a colonoscopy that showed diverticulosis in 2005, an EGD that showed a small hiatal hernia and peptic ulcer disease in 2005.   MEDICATIONS PRIOR TO ADMISSION: Allopurinol 300 mg daily, Breo Ellipta 100/25 mcg daily, Colcrys 0.6 mg p.r.n., digoxin 250 mcg daily, diltiazem 300 mg daily, ethambutol 400 mg 5 tablets twice a week, furosemide 20 mg b.i.d., isoniazid 300 mg 1 1/2 tablets Mondays and Thursdays, losartan 50 mg daily, lovastatin 20 mg daily, metoprolol 50 mg daily,  pyrazinamide 500 mg 4 tablets Monday and Thursday, pyridoxine 25 mg Monday and Thursday, rifampin 300 mg Monday and Thursday, Ventolin inhalers 2 puffs q.i.d., Coumadin 10 mg Saturday and Sunday, 8 mg every other day except Saturday. ALLERGIES: Known drug allergies.   FAMILY HISTORY: There is no known family history of colon carcinoma, liver or chronic GI problems.   SOCIAL HISTORY: He lives with his wife. He does not have any children.  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 No history of tobacco, alcohol, or illicit drug use.   REVIEW OF SYSTEMS:  See HPI.  He is having some shortness of breath, cough, weakness. Otherwise, negative complete review.   PHYSICAL EXAMINATION:  VITAL SIGNS:  BMI is 43.7, weight 313 pounds, height 70.9 inches.  GENERAL: He is a morbidly obese black male who is alert, oriented, pleasant, cooperative, in no acute distress.  HEENT:  Sclerae clear, anicteric.  Conjunctivae pink. Oropharynx pink and moist without any lesions.  NECK: Supple without any mass or thyromegaly.  CHEST: Rate is irregularly irregular without any murmurs, clicks, rubs, or gallops.  LUNGS: He has decreased breath sounds bilaterally with significant emphysematous changes with rhonchi and crackles bilaterally.  ABDOMEN: Protuberant. Positive bowel sounds x 4. No bruits auscultated. Abdomen is soft, nontender, nondistended, without palpable mass or hepatosplenomegaly.  No rebound tenderness or guarding.  EXTREMITIES: Without edema or cyanosis.  SKIN: Warm and dry without any rash or jaundice.  NEUROLOGIC: Grossly intact.  MUSCULOSKELETAL:  Good equal movement and strength bilaterally.  PSYCHIATRIC: Alert, cooperative, normal mood and affect.   LABORATORY STUDIES: Glucose 121, chloride 99, CO2 33, calcium 8.1, albumin 2.6 otherwise normal comprehensive metabolic panel.  Negative troponin.  Platelets 292,000.  INR 1.7.  Fecal occult blood negative.   IMPRESSION: Mr. Jonathan [name] is a  very pleasant, 54 year old, morbidly obese male with acute on chronic respiratory failure, chronic obstructive pulmonary disease, recurrent cavitary pneumonia currently on tuberculosis treatment with obstructive sleep apnea, hypertension, hyperlipidemia, and atrial fibrillation.  We were consulted for possible gastroesophageal reflux disease and silent aspiration given recurrent pneumonia. He denies any upper gastrointestinal symptoms including dysphagia, odynophagia, heartburn, indigestion, nausea, or vomiting. He may have silent aspiration. I discussed his care with Dr. Servando SnareWohl. We would suggest modifying his lifestyle factors including increasing the head of the bed, gastroesophageal reflux disease diet, weight loss, and daily proton pump inhibitor. He is not an endoscopic candidate at this time given his respiratory issues.  Thank you for allowing us to participate in his care.   ____________________________ Joselyn ArrowKandice L. Tarek Cravens, NP klj:sp D: 02/10/2015 10:54:40 ET T: 02/10/2015 11:34:13 ET JOB#: [id number]  cc: Joselyn ArrowKandice L. Carletha Dawn, NP, <Dictator> Midge Miniumarren Wohl, MD Dennison MascotLemont Morrisey, MD Stann Mainlandavid P. Sampson GoonFitzgerald, MD Herbon E. Meredeth IdeFleming, MD  Joselyn ArrowKANDICE L Albertus Chiarelli FNP ELECTRONICALLY SIGNED 03/03/2015 13:39

## 2015-02-20 NOTE — Consult Note (Signed)
Brief Consult Note: Diagnosis: ?GERD, silent aspiration.   Patient was seen by consultant.   Comments: Mr. Jonathan [name] is a very pleasant 54 y/o morbidly obese male with acute on chronic respiratory failure, COPD, recurrent  cavitary pneumonia, currently on TB treatment with OSA, HTN, hyperlipidemia & AFib.  We were consulted for possible GERD & silent aspiration given recurrent PNA.  He denies any upper GI symptoms including dysphagia, odynophagia, heartburn, indigestion, nausea, or vomiting.  He may have silent aspiration.  I discussed his care with Dr Servando SnareWohl.  We would suggest modifying lifestyle factors, ie increase HOB, GERD diet, weight loss, & add daily PPI.  He is not an endoscopic candidate at this time given his respiratory issues. Thanks for allowing us to participate in his care.  Please see full dictated note. #[id number].  Electronic Signatures: Joselyn ArrowJones, Yunus Stoklosa L (NP)  (Signed 21-Apr-16 10:58)  Authored: Brief Consult Note   Last Updated: 21-Apr-16 10:58 by Joselyn ArrowJones, Terrel Manalo L (NP)

## 2015-02-20 NOTE — Discharge Summary (Signed)
PATIENT NAME:  Jonathan [name], Jonathan [name] MR#:  [id number] DATE OF BIRTH:  [birth year]  DATE OF ADMISSION:  12/19/2014 DATE OF DISCHARGE:  12/28/2014  PRIMARY CARE PHYSICIAN: Marisue Ivan, MD    CONSULTANTS DURING THE HOSPITAL COURSE: Include:   Dr. Fausto Skillern P. Sampson Goon, MD, infectious disease   Marcina Millard, MD    FINAL DIAGNOSES:  1.  Acute on chronic hypoxic and hypercarbic respiratory failure.  2.  Bilateral pneumonia with cavitary lesion.  3.  Atrial fibrillation.  4.  Essential hypertension.  5.  Morbid obesity and sleep apnea.   MEDICATIONS ON DISCHARGE: Include lovastatin 20 mg in the morning, Pradaxa 150 mg twice a day, Colcrys 0.6 mg daily, digoxin 250 mcg daily, allopurinol 300 mg daily, losartan 50 mg daily, Ventolin HFA 2 puffs every 4 hours as needed, Breo Ellipta 100 mcg/25 mcg 1 puff once a day, metoprolol tartrate 50 mg twice a day, diltiazem extended release 300 mg 1 capsule daily, albuterol nebulizer 3 mL inhaled every 6 hours as needed for shortness of breath, furosemide 40 mg daily, Augmentin 875 mg 1 tablet twice a day for 18 more days, Bactrim DS 1 tablet every 12 hours for 18 more days.   HOME HEALTH: Yes, nurse for skilled assessment. Oxygen 4 liters nasal cannula continuous with CPAP at night.   DIET: Low sodium diet, regular consistency.   ACTIVITY: As tolerated.   FOLLOWUP: With Dr. Burnadette Pop 1 to 2 weeks, Dr. Meredeth Ide 2 weeks, Dr. Sampson Goon 3 weeks.   HOSPITAL COURSE: The patient was admitted 12/19/2014, discharged 12/28/2014. Initially came in with shortness of breath, cough, and chills, found to have multilobar pneumonia, was started on broad-spectrum antibiotics. Pulmonary consultation was obtained.   Laboratory and radiological data during the hospital course included an EKG, showed atrial fibrillation, rapid ventricular response, nonspecific ST-T wave changes. Pneumonitis hypersensitivity panel was negative. Hepatitis A, B, and C panel all  negative. INR 1.3, PT 16.3, PTT 36.8, troponin borderline at 0.11, glucose 123, BUN 18, creatinine 1.40, sodium 137, potassium 4.0, chloride 103, CO2 25, calcium 8.0. Liver function test: Elevated ALT at 157, AST at 166, albumin low at 1.9. White blood cell count 9.4, H and H was 13.9 and 42.4, platelet count of 319. CT scan of the chest for pulmonary embolism showed significant interval progression of peribronchial vascular consolidation and patchy airspace disease throughout the inferior right upper lobe and right lower lobe. Again there has been increased mass-like consolidation in the superior margin at the large cavitary lesion in the left upper lobe. Overall findings are concerning for progression of an aggressive multilobar pneumonia. Blood cultures were negative for 5 days. Lactic acid 1.5. Next troponin borderline at 0.08. Hemoglobin A1c 6.2. Legionnaire antigen negative. Stool for C. difficile was negative. Bronchial brushing cytology negative for malignant cells. Bronchial wash cultures: No growth, no yeast. Acid fast smear negative. Culture is still pending. HIV test negative. Creatinine upon discharge 1.2. O2 saturation on room air 87%. Hemoglobin upon discharge 11.5.   Hospital course per problem list:  1.  For the patient's acute on chronic hypoxic and hypercarbic respiratory failure, the patient still requires oxygen at home. The patient was recently put on 4 liters of oxygen. We kept him on oxygen, still qualified at this point in time while going through treatment for bilateral pneumonia with cavitary lesion. I did prescribe a nebulizer upon going home.  2.  Bilateral pneumonia with cavitary lesion. The patient had a bronchoscopy, no growth, but  the patient was on antibiotics prior to that. A lot of purulent material was sucked out on bronchoscopy. The patient will have a total of 21 day treatment since the bronchoscopy, on Augmentin and Bactrim as per infectious disease consultation, Dr.  Sampson GoonFitzgerald.  3.  Atrial fibrillation. I had to increase his diltiazem to 300 mg, continue his digoxin and metoprolol for his rapid atrial fibrillation. With any movement his heart rate does go up. Likely this is related to the pneumonia, deconditioning and obesity also.  4.  Hypertension, essential. Blood pressure is stable.  5.  Morbid obesity and sleep apnea. The patient's BMI is 46.2. Weight loss is definitely needed. The patient does have CPAP at night.   Close clinical follow up needed as outpatient to ensure clearing of his bilateral pulmonary infiltrates and cavitary lesion. The patient works as a [employer] architecttruck driver. I did give him a note for work off for 10 days. Probably will not be able to go back to work being on oxygen, can fill out disability paperwork with Dr. Burnadette PopLinthavong.   TIME SPENT ON DISCHARGE: 35 minutes.    ____________________________ Herschell Dimesichard J. Renae GlossWieting, MD rjw:AT D: 12/28/2014 15:39:23 ET T: 12/29/2014 00:37:27 ET JOB#: [id number]  cc: Herschell Dimesichard J. Renae GlossWieting, MD, <Dictator> Marisue IvanKanhka Linthavong, MD Dr. Fausto SkillernFleming David P. Sampson GoonFitzgerald, MD Marcina MillardAlexander Paraschos, MD    Salley ScarletICHARD J Davelle Anselmi MD ELECTRONICALLY SIGNED 12/29/2014 10:55

## 2015-02-28 ENCOUNTER — Encounter: Payer: Self-pay | Admitting: Emergency Medicine

## 2015-02-28 ENCOUNTER — Inpatient Hospital Stay
Admission: EM | Admit: 2015-02-28 | Discharge: 2015-03-05 | DRG: 194 | Disposition: A | Discharge status: Home / Self Care | Payer: Medicaid Other | Attending: Internal Medicine | Admitting: Internal Medicine

## 2015-02-28 ENCOUNTER — Inpatient Hospital Stay: Payer: Medicaid Other

## 2015-02-28 ENCOUNTER — Emergency Department: Payer: Medicaid Other

## 2015-02-28 DIAGNOSIS — I480 Paroxysmal atrial fibrillation: Secondary | ICD-10-CM | POA: Diagnosis present

## 2015-02-28 DIAGNOSIS — E785 Hyperlipidemia, unspecified: Secondary | ICD-10-CM | POA: Diagnosis present

## 2015-02-28 DIAGNOSIS — J441 Chronic obstructive pulmonary disease with (acute) exacerbation: Secondary | ICD-10-CM | POA: Diagnosis present

## 2015-02-28 DIAGNOSIS — D696 Thrombocytopenia, unspecified: Secondary | ICD-10-CM | POA: Diagnosis present

## 2015-02-28 DIAGNOSIS — Z7901 Long term (current) use of anticoagulants: Secondary | ICD-10-CM

## 2015-02-28 DIAGNOSIS — I10 Essential (primary) hypertension: Secondary | ICD-10-CM | POA: Diagnosis present

## 2015-02-28 DIAGNOSIS — M109 Gout, unspecified: Secondary | ICD-10-CM | POA: Diagnosis present

## 2015-02-28 DIAGNOSIS — I4891 Unspecified atrial fibrillation: Secondary | ICD-10-CM | POA: Diagnosis present

## 2015-02-28 DIAGNOSIS — A150 Tuberculosis of lung: Secondary | ICD-10-CM | POA: Diagnosis present

## 2015-02-28 DIAGNOSIS — Z9981 Dependence on supplemental oxygen: Secondary | ICD-10-CM | POA: Diagnosis not present

## 2015-02-28 DIAGNOSIS — R0600 Dyspnea, unspecified: Secondary | ICD-10-CM

## 2015-02-28 DIAGNOSIS — J189 Pneumonia, unspecified organism: Secondary | ICD-10-CM | POA: Diagnosis not present

## 2015-02-28 DIAGNOSIS — G4733 Obstructive sleep apnea (adult) (pediatric): Secondary | ICD-10-CM | POA: Diagnosis present

## 2015-02-28 HISTORY — DX: Chronic obstructive pulmonary disease, unspecified: J44.9

## 2015-02-28 HISTORY — DX: Hyperlipidemia, unspecified: E78.5

## 2015-02-28 HISTORY — DX: Respiratory tuberculosis unspecified: A15.9

## 2015-02-28 HISTORY — DX: Essential (primary) hypertension: I10

## 2015-02-28 HISTORY — DX: Sleep apnea, unspecified: G47.30

## 2015-02-28 HISTORY — DX: Unspecified atrial fibrillation: I48.91

## 2015-02-28 HISTORY — DX: Gout, unspecified: M10.9

## 2015-02-28 HISTORY — DX: Obesity, unspecified: E66.9

## 2015-02-28 LAB — COMPREHENSIVE METABOLIC PANEL
ALT: 18 U/L (ref 17–63)
ANION GAP: 9 (ref 5–15)
AST: 27 U/L (ref 15–41)
Albumin: 3 g/dL — ABNORMAL LOW (ref 3.5–5.0)
Alkaline Phosphatase: 77 U/L (ref 38–126)
BILIRUBIN TOTAL: 0.9 mg/dL (ref 0.3–1.2)
BUN: 6 mg/dL (ref 6–20)
CALCIUM: 8.4 mg/dL — AB (ref 8.9–10.3)
CHLORIDE: 100 mmol/L — AB (ref 101–111)
CO2: 32 mmol/L (ref 22–32)
CREATININE: 0.82 mg/dL (ref 0.61–1.24)
GFR calc Af Amer: >60 mL/min (ref >60)
Glucose, Bld: 95 mg/dL (ref 65–99)
Potassium: 3.9 mmol/L (ref 3.5–5.1)
Sodium: 141 mmol/L (ref 135–145)
Total Protein: 7.2 g/dL (ref 6.5–8.1)

## 2015-02-28 LAB — CBC WITH DIFFERENTIAL/PLATELET
BASOS ABS: 0 10*3/uL (ref 0–0.1)
EOS ABS: 0.3 10*3/uL (ref 0–0.7)
HEMATOCRIT: 38.2 % — AB (ref 40.0–52.0)
Hemoglobin: 11.9 g/dL — ABNORMAL LOW (ref 13.0–18.0)
LYMPHS ABS: 0.4 10*3/uL — AB (ref 1.0–3.6)
Lymphocytes Relative: 5 %
MCH: 26.6 pg (ref 26.0–34.0)
MCHC: 31.1 g/dL — ABNORMAL LOW (ref 32.0–36.0)
MCV: 85.4 fL (ref 80.0–100.0)
MONO ABS: 0.6 10*3/uL (ref 0.2–1.0)
Neutro Abs: 6.1 10*3/uL (ref 1.4–6.5)
Neutrophils Relative %: 82 %
Platelets: 47 10*3/uL — ABNORMAL LOW (ref 150–440)
RBC: 4.48 MIL/uL (ref 4.40–5.90)
RDW: 19.8 % — ABNORMAL HIGH (ref 11.5–14.5)
WBC: 7.3 10*3/uL (ref 3.8–10.6)

## 2015-02-28 LAB — BRAIN NATRIURETIC PEPTIDE: B Natriuretic Peptide: 227 pg/mL — ABNORMAL HIGH (ref 0.0–100.0)

## 2015-02-28 LAB — PLATELET COUNT: PLATELETS: 183 10*3/uL (ref 150–440)

## 2015-02-28 LAB — PROTIME-INR
INR: 1.05
PROTHROMBIN TIME: 13.9 s (ref 11.4–15.0)

## 2015-02-28 LAB — TROPONIN I: Troponin I: <0.03 ng/mL (ref <0.031)

## 2015-02-28 MED ORDER — HYDROCODONE-HOMATROPINE 5-1.5 MG/5ML PO SYRP
5.0000 mL | ORAL_SOLUTION | Freq: Four times a day (QID) | ORAL | Status: DC | PRN
  Administered 2015-02-28: 5 mL via ORAL
  Filled 2015-02-28: qty 5

## 2015-02-28 MED ORDER — PRAVASTATIN SODIUM 20 MG PO TABS
20.0000 mg | ORAL_TABLET | Freq: Every day | ORAL | Status: DC
  Administered 2015-03-01 – 2015-03-04 (×4): 20 mg via ORAL
  Filled 2015-02-28 (×4): qty 1

## 2015-02-28 MED ORDER — METHYLPREDNISOLONE SODIUM SUCC 125 MG IJ SOLR
60.0000 mg | Freq: Three times a day (TID) | INTRAMUSCULAR | Status: DC
  Administered 2015-02-28 – 2015-03-03 (×8): 60 mg via INTRAVENOUS
  Filled 2015-02-28 (×8): qty 2

## 2015-02-28 MED ORDER — FUROSEMIDE 40 MG PO TABS
40.0000 mg | ORAL_TABLET | Freq: Every day | ORAL | Status: DC
  Administered 2015-03-01 – 2015-03-05 (×5): 40 mg via ORAL
  Filled 2015-02-28 (×5): qty 1

## 2015-02-28 MED ORDER — METHYLPREDNISOLONE SODIUM SUCC 125 MG IJ SOLR
INTRAMUSCULAR | Status: AC
  Filled 2015-02-28: qty 2

## 2015-02-28 MED ORDER — DOCUSATE SODIUM 100 MG PO CAPS: 100.0000 mg | ORAL_CAPSULE | Freq: Two times a day (BID) | ORAL | Status: DC | PRN

## 2015-02-28 MED ORDER — WARFARIN SODIUM 7.5 MG PO TABS: 10.0000 mg | ORAL_TABLET | Freq: Every day | ORAL | Status: DC

## 2015-02-28 MED ORDER — PYRAZINAMIDE 500 MG PO TABS
2000.0000 mg | ORAL_TABLET | Freq: Every day | ORAL | Status: DC
  Administered 2015-03-01 – 2015-03-05 (×5): 2000 mg via ORAL
  Filled 2015-02-28 (×6): qty 4

## 2015-02-28 MED ORDER — LOSARTAN POTASSIUM 50 MG PO TABS
50.0000 mg | ORAL_TABLET | Freq: Every day | ORAL | Status: DC
  Administered 2015-03-01 – 2015-03-05 (×4): 50 mg via ORAL
  Filled 2015-02-28 (×5): qty 1

## 2015-02-28 MED ORDER — IPRATROPIUM-ALBUTEROL 0.5-2.5 (3) MG/3ML IN SOLN
RESPIRATORY_TRACT | Status: AC
  Filled 2015-02-28: qty 3

## 2015-02-28 MED ORDER — VITAMIN B-6 50 MG PO TABS
25.0000 mg | ORAL_TABLET | Freq: Every day | ORAL | Status: DC
  Administered 2015-03-01 – 2015-03-05 (×5): 25 mg via ORAL
  Filled 2015-02-28: qty 0.5
  Filled 2015-02-28: qty 1
  Filled 2015-02-28: qty 0.5
  Filled 2015-02-28: qty 1
  Filled 2015-02-28 (×3): qty 0.5

## 2015-02-28 MED ORDER — ACETAMINOPHEN 325 MG PO TABS: 650.0000 mg | ORAL_TABLET | Freq: Four times a day (QID) | ORAL | Status: DC | PRN

## 2015-02-28 MED ORDER — ISONIAZID 300 MG PO TABS
300.0000 mg | ORAL_TABLET | Freq: Every day | ORAL | Status: DC
  Administered 2015-03-01 – 2015-03-05 (×5): 300 mg via ORAL
  Filled 2015-02-28 (×6): qty 1

## 2015-02-28 MED ORDER — ACETAMINOPHEN 650 MG RE SUPP: 650.0000 mg | Freq: Four times a day (QID) | RECTAL | Status: DC | PRN

## 2015-02-28 MED ORDER — AMOXICILLIN-POT CLAVULANATE 875-125 MG PO TABS
1.0000 | ORAL_TABLET | Freq: Two times a day (BID) | ORAL | Status: DC
  Administered 2015-02-28 – 2015-03-02 (×4): 1 via ORAL
  Filled 2015-02-28 (×4): qty 1

## 2015-02-28 MED ORDER — IPRATROPIUM-ALBUTEROL 0.5-2.5 (3) MG/3ML IN SOLN
3.0000 mL | Freq: Once | RESPIRATORY_TRACT | Status: AC
  Administered 2015-02-28: 3 mL via RESPIRATORY_TRACT

## 2015-02-28 MED ORDER — ALLOPURINOL 100 MG PO TABS
300.0000 mg | ORAL_TABLET | Freq: Every day | ORAL | Status: DC
  Administered 2015-03-01 – 2015-03-05 (×5): 300 mg via ORAL
  Filled 2015-02-28 (×2): qty 3
  Filled 2015-02-28: qty 1
  Filled 2015-02-28 (×2): qty 3

## 2015-02-28 MED ORDER — MOMETASONE FURO-FORMOTEROL FUM 100-5 MCG/ACT IN AERO
2.0000 | INHALATION_SPRAY | Freq: Two times a day (BID) | RESPIRATORY_TRACT | Status: DC
  Administered 2015-02-28 – 2015-03-05 (×10): 2 via RESPIRATORY_TRACT
  Filled 2015-02-28 (×2): qty 8.8

## 2015-02-28 MED ORDER — METHYLPREDNISOLONE SODIUM SUCC 125 MG IJ SOLR
125.0000 mg | Freq: Once | INTRAMUSCULAR | Status: AC
  Administered 2015-02-28: 125 mg via INTRAVENOUS

## 2015-02-28 MED ORDER — VANCOMYCIN HCL IN DEXTROSE 1-5 GM/200ML-% IV SOLN
1000.0000 mg | Freq: Once | INTRAVENOUS | Status: AC
  Administered 2015-02-28: 1000 mg via INTRAVENOUS

## 2015-02-28 MED ORDER — WARFARIN SODIUM 2.5 MG PO TABS: 8.0000 mg | ORAL_TABLET | Freq: Every day | ORAL | Status: DC

## 2015-02-28 MED ORDER — PIPERACILLIN-TAZOBACTAM 3.375 G IVPB
INTRAVENOUS | Status: AC
  Filled 2015-02-28: qty 50

## 2015-02-28 MED ORDER — LEVOFLOXACIN IN D5W 750 MG/150ML IV SOLN
750.0000 mg | Freq: Once | INTRAVENOUS | Status: AC
  Administered 2015-02-28: 750 mg via INTRAVENOUS

## 2015-02-28 MED ORDER — DILTIAZEM HCL ER COATED BEADS 180 MG PO CP24
300.0000 mg | ORAL_CAPSULE | Freq: Every day | ORAL | Status: DC
  Administered 2015-03-01 – 2015-03-05 (×5): 300 mg via ORAL
  Filled 2015-02-28 (×6): qty 1

## 2015-02-28 MED ORDER — SENNA 8.6 MG PO TABS: 1.0000 | ORAL_TABLET | Freq: Every day | ORAL | Status: DC | PRN

## 2015-02-28 MED ORDER — VANCOMYCIN HCL IN DEXTROSE 1-5 GM/200ML-% IV SOLN
INTRAVENOUS | Status: AC
  Filled 2015-02-28: qty 200

## 2015-02-28 MED ORDER — ALBUTEROL SULFATE (2.5 MG/3ML) 0.083% IN NEBU
2.5000 mg | INHALATION_SOLUTION | RESPIRATORY_TRACT | Status: DC | PRN
  Administered 2015-02-28: 2.5 mg via RESPIRATORY_TRACT

## 2015-02-28 MED ORDER — DIGOXIN 250 MCG PO TABS
0.2500 mg | ORAL_TABLET | Freq: Every day | ORAL | Status: DC
  Administered 2015-03-01 – 2015-03-05 (×5): 0.25 mg via ORAL
  Filled 2015-02-28 (×5): qty 1

## 2015-02-28 MED ORDER — METOPROLOL TARTRATE 50 MG PO TABS
50.0000 mg | ORAL_TABLET | Freq: Two times a day (BID) | ORAL | Status: DC
  Administered 2015-02-28 – 2015-03-03 (×6): 50 mg via ORAL
  Filled 2015-02-28 (×6): qty 1

## 2015-02-28 MED ORDER — PIPERACILLIN-TAZOBACTAM 3.375 G IVPB
3.3750 g | Freq: Once | INTRAVENOUS | Status: AC
  Administered 2015-02-28: 3.375 g via INTRAVENOUS

## 2015-02-28 MED ORDER — IOHEXOL 350 MG/ML SOLN
100.0000 mL | Freq: Once | INTRAVENOUS | Status: AC | PRN
  Administered 2015-02-28: 100 mL via INTRAVENOUS

## 2015-02-28 MED ORDER — IPRATROPIUM-ALBUTEROL 0.5-2.5 (3) MG/3ML IN SOLN
3.0000 mL | RESPIRATORY_TRACT | Status: DC
  Administered 2015-03-01 – 2015-03-03 (×18): 3 mL via RESPIRATORY_TRACT
  Filled 2015-02-28 (×19): qty 3

## 2015-02-28 MED ORDER — RIFAMPIN 300 MG PO CAPS
600.0000 mg | ORAL_CAPSULE | Freq: Every day | ORAL | Status: DC
Start: 2015-02-28 — End: 2015-03-05
  Administered 2015-03-01 – 2015-03-05 (×5): 600 mg via ORAL
  Filled 2015-02-28 (×6): qty 2

## 2015-02-28 MED ORDER — LEVOFLOXACIN IN D5W 750 MG/150ML IV SOLN
INTRAVENOUS | Status: AC
  Filled 2015-02-28: qty 150

## 2015-02-28 MED ORDER — WARFARIN SODIUM 1 MG PO TABS: 8.0000 mg | ORAL_TABLET | Freq: Every day | ORAL | Status: DC

## 2015-02-28 MED ORDER — COLCHICINE 0.6 MG PO TABS
0.6000 mg | ORAL_TABLET | Freq: Every day | ORAL | Status: DC
  Administered 2015-03-04: 0.6 mg via ORAL
  Filled 2015-02-28 (×5): qty 1

## 2015-02-28 MED ORDER — FLUTICASONE FUROATE-VILANTEROL 100-25 MCG/INH IN AEPB: 1.0000 | INHALATION_SPRAY | Freq: Every day | RESPIRATORY_TRACT | Status: DC

## 2015-02-28 NOTE — ED Notes (Signed)
Patient transported to X-ray 

## 2015-02-28 NOTE — ED Notes (Signed)
Patient c/o shortness of breath x1 week. Worsening with time. Also reports fevers, chills, nasal congestion. Reports clear sputum.

## 2015-02-28 NOTE — ED Provider Notes (Addendum)
[facility] Emergency Department Provider Note    Time seen: 1:10 PM  I have reviewed the triage vital signs and the nursing notes.   HISTORY  Chief Complaint Shortness of Breath    HPI Jonathan PaceKelvin D Beswick Sr. is a 54 y.o. male who presents here for shortness breath last week. Patient states he's been seen recently for same, and that is worsening with time. Also reports fevers chills and congestion clear sputum production. Exertion worsening symptoms significantly with hypoxia. She is sitting still makes his symptoms better. Denies any pain at this time.     No past medical history on file.  There are no active problems to display for this patient.   No past surgical history on file.  No current outpatient prescriptions on file.  Allergies Review of patient's allergies indicates not on file.  No family history on file.  Social History History  Substance Use Topics  . Smoking status: Not on file  . Smokeless tobacco: Not on file  . Alcohol Use: Not on file    Review of Systems Constitutional: fever  Eyes: Negative for visual changes. ENT: Negative for sore throat. Cardiovascular: Negative for chest pain. Respiratorypositive for shortness of breath worse  with exertion Gastrointestinal: Negative for abdominal pain, vomiting and diarrhea. Genitourinary: Negative for dysuria. Musculoskeletal: Negative for back pain. Skin: Negative for rash. Neurological: Negative for headaches, focal weakness or numbness.  10-point ROS otherwise negative.  ____________________________________________   PHYSICAL EXAM:  VITAL SIGNS: ED Triage Vitals  Enc Vitals Group     BP --      Pulse --      Resp --      Temp --      Temp src --      SpO2 --      Weight --      Height --      Head Cir --      Peak Flow --      Pain Score --      Pain Loc --      Pain Edu? --      Excl. in GC? --     Constitutional: Alert and oriented. Mild  distress Eyes: Conjunctivae are normal. PERRL. Normal extraocular movements. ENT   Head: Normocephalic and atraumatic.   Nose: No congestion/rhinnorhea.   Mouth/Throat: Mucous membranes are moist.   Neck: No stridor. Hematological/Lymphatic/Immunilogical: No cervical lymphadenopathy. Cardiovascular: Normal rate, regular rhythm. Normal and symmetric distal pulses are present in all extremities. No murmurs, rubs, or gallops. Respiratory: Tachypnea, . Rales and rhonchi noted bilaterally Gastrointestinal: Soft and nontender. No distention. No abdominal bruits. There is no CVA tenderness. Musculoskeletal: Nontender with normal range of motion in all extremities. significant pitting edema below the knees bilaterally  Neurologic:  Normal speech and language. No gross focal neurologic deficits are appreciated. Speech is normal. No gait instability. Skin:  Skin is warm, dry and intact. No rash noted. Psychiatric: Mood and affect are normal. Speech and behavior are normal. Patient exhibits appropriate insight and judgment.  ____________________________________________    LABS (pertinent positives/negatives)  Labs Reviewed  CBC WITH DIFFERENTIAL/PLATELET - Abnormal; Notable for the following:    Hemoglobin 11.9 (*)    HCT 38.2 (*)    MCHC 31.1 (*)    RDW 19.8 (*)    Platelets 47 (*)    Lymphs Abs 0.4 (*)    All other components within normal limits  BRAIN NATRIURETIC PEPTIDE - Abnormal; Notable for the following:  B Natriuretic Peptide 227.0 (*)    All other components within normal limits  COMPREHENSIVE METABOLIC PANEL - Abnormal; Notable for the following:    Chloride 100 (*)    Calcium 8.4 (*)    Albumin 3.0 (*)    All other components within normal limits  BLOOD GAS, VENOUS - Abnormal; Notable for the following:    pCO2, Ven 69 (*)    Bicarbonate 37.2 (*)    Acid-Base Excess 8.7 (*)    All other components within normal limits  BASIC METABOLIC PANEL - Abnormal;  Notable for the following:    Chloride 100 (*)    Glucose, Bld 140 (*)    Calcium 8.1 (*)    All other components within normal limits  CBC - Abnormal; Notable for the following:    RBC 4.31 (*)    Hemoglobin 11.4 (*)    HCT 36.3 (*)    MCHC 31.5 (*)    RDW 19.7 (*)    All other components within normal limits  PROTIME-INR - Abnormal; Notable for the following:    Prothrombin Time 15.8 (*)    All other components within normal limits  BASIC METABOLIC PANEL - Abnormal; Notable for the following:    Chloride 99 (*)    CO2 34 (*)    Glucose, Bld 155 (*)    Calcium 8.3 (*)    All other components within normal limits  CBC - Abnormal; Notable for the following:    Hemoglobin 12.3 (*)    HCT 38.8 (*)    MCHC 31.7 (*)    RDW 19.1 (*)    All other components within normal limits  PROTIME-INR - Abnormal; Notable for the following:    Prothrombin Time 15.1 (*)    All other components within normal limits  DIGOXIN LEVEL - Abnormal; Notable for the following:    Digoxin Level 0.2 (*)    All other components within normal limits  PROTIME-INR - Abnormal; Notable for the following:    Prothrombin Time 17.5 (*)    All other components within normal limits  BASIC METABOLIC PANEL - Abnormal; Notable for the following:    Chloride 99 (*)    CO2 37 (*)    Glucose, Bld 104 (*)    Calcium 8.0 (*)    Anion gap 3 (*)    All other components within normal limits  TROPONIN I  PROTIME-INR  PLATELET COUNT  PROTIME-INR  PROTIME-INR     EKG: EKG interpreted by me, sinus tachycardia with a rate of 123, frequent PVCs, leftward axis, no evidence of ST elevation ____________________________________________    RADIOLOGY  Chest Xray: Minimal interval change and bilateral airspace disease  ____________________________________________    ED COURSE  Pertinent labs & imaging results that were available during my care of the patient were reviewed by me and considered in my medical decision  making (see chart for details).  Will check basic labs and chest x-ray  FINAL ASSESSMENT AND PLAN  Assessment: Persistent pneumonia  Plan: Patient with hypoxia and persistent pneumonia. Per Dr. Stormy CardFitz Gerald patient does not have active TB. He appears to have persistent pneumonia and is very symptomatic. Patient may need readmission to the hospital and IV antibiotics as well as IV steroids and breathing treatments.    Emily FilbertWilliams, Jonathan E, MD   Emily FilbertJonathan E Williams, MD 02/28/15 1525  Emily FilbertJonathan E Williams, MD 03/16/15 [phone]

## 2015-02-28 NOTE — ED Notes (Signed)
Per MD Nemiah CommanderKalisetti, airborne precautions are not required.  Confirmed that no signed and held orders for airborne precautions have been placed.

## 2015-02-28 NOTE — ED Notes (Signed)
Second DuoNeb treatment completed, returned to nasal cannula at 5L/min.

## 2015-02-28 NOTE — Progress Notes (Signed)
ANTICOAGULATION CONSULT NOTE - Initial Consult  Pharmacy Consult for coumadin Indication: atrial fibrillation  No Known Allergies  Patient Measurements: Height: 5\' 11"  (180.3 cm) Weight: (!) 338 lb (153.316 kg) IBW/kg (Calculated) : 75.3 Heparin Dosing Weight:   Vital Signs: Temp: 98.6 F (37 C) (05/09 1827) Temp Source: Oral (05/09 1827) BP: 94/74 mmHg (05/09 1827) Pulse Rate: 101 (05/09 1827)  Labs:  Recent Labs  02/28/15 1348  HGB 11.9*  HCT 38.2*  PLT 47*  LABPROT 13.9  INR 1.05  CREATININE 0.82  TROPONINI <0.03    Estimated Creatinine Clearance: 153.3 mL/min (by C-G formula based on Cr of 0.82).   Medical History: Past Medical History  Diagnosis Date  . Atrial fibrillation   . Hypertension   . Tuberculosis   . Sleep apnea   . COPD (chronic obstructive pulmonary disease)     on 5l home oxygen  . Obesity   . Gout   . Hyperlipidemia     Medications:  Scheduled:  . allopurinol  300 mg Oral Daily  . amoxicillin-clavulanate  1 tablet Oral BID  . colchicine  0.6 mg Oral Daily  . digoxin  0.25 mg Oral Daily  . diltiazem  300 mg Oral Daily  . furosemide  40 mg Oral Daily  . ipratropium-albuterol  3 mL Nebulization Q4H  . isoniazid  300 mg Oral Daily  . levofloxacin (LEVAQUIN) IV  750 mg Intravenous Once  . losartan  50 mg Oral Daily  . methylPREDNISolone (SOLU-MEDROL) injection  60 mg Intravenous 3 times per day  . metoprolol  50 mg Oral BID  . mometasone-formoterol  2 puff Inhalation BID  . [START ON 03/01/2015] pravastatin  20 mg Oral q1800  . pyrazinamide  2,000 mg Oral Daily  . vitamin B-6  25 mg Oral Daily  . rifampin  600 mg Oral Daily  . warfarin  8 mg Oral q1800    Assessment: Pharmacy consulted to dose coumadin in this 54yo M being treated for history of Afib. Patient takes coumadin 10mg  on Sat and Sun, and 8mg  on M-F. Current INR is 1.05 Goal of Therapy:  INR 2-3    Plan:  Will order coumadin 8mg  daily at this time and follow INR  daily.  Pharmacy will continue to follow.  [address], [place] D., BCPS 02/28/2015

## 2015-02-28 NOTE — H&P (Signed)
[facility] [place] [facility]   PATIENT NAME: Jonathan [name]    MR#:  [id number]  DATE OF BIRTH:  03/30/1961  DATE OF ADMISSION:  02/28/2015  PRIMARY CARE PHYSICIAN: Marisue IvanLINTHAVONG, KANHKA, MD   REQUESTING/REFERRING PHYSICIAN: Dr. Mayford KnifeWilliams  CHIEF COMPLAINT:   Chief Complaint  Patient presents with  . Shortness of Breath    HISTORY OF PRESENT ILLNESS:  Jonathan [name]  is a obese 54 y.o. male with a known history of A. fib on Coumadin, hypertension, COPD on 5 L home oxygen, sleep apnea on CPAP, being treated for tuberculosis by health department, recent admission to the hospital 2 weeks ago for pneumonia comes again secondary to worsening dyspnea at this time. 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 His wife is also being actively treated for the same. He is a [employer] architecttruck driver and does not know how he contacted that. He had significant along the damage secondary to tuberculosis. He is under directly observed treatment by health department. He will need total of 6-9 months of treatment. 2 weeks ago patient was here for multilobar pneumonia and was started on broad-spectrum antibiotics at that time but finally treated with Levaquin. His symptoms have improved significantly and he was discharged home on prednisone taper about 2 weeks ago. Patient said it for 1 week after his discharge he felt better. Once his steroids were off he started to get shortness of breath again and having cough with mucoid phlegm. He was started on Augmentin by Dr. Reita ClicheFleming's office. Patient had a bronchoscopy and biopsy done in March 2016 when his TB was diagnosed. Dyspnea started to get worse over the past week and today he couldn't even take a few steps. During his last admission he was also evaluated for possible silent aspiration by GI. Due to his respiratory issues he was not a candidate for EGD but they've recommended PPI daily and also head of the  bed elevation. Patient has been following all those precautions. He denies any possibility of aspiration. Rate today again confirms continued infiltrates and possible pneumonia.  PAST MEDICAL HISTORY:   Past Medical History  Diagnosis Date  . Atrial fibrillation   . Hypertension   . Tuberculosis   . Sleep apnea   . COPD (chronic obstructive pulmonary disease)     on 5l home oxygen  . Obesity   . Gout   . Hyperlipidemia     PAST SURGICAL HISTORY:   Past Surgical History  Procedure Laterality Date  . Bronchoscopy      SOCIAL HISTORY:   History  Substance Use Topics  . Smoking status: Never Smoker   . Smokeless tobacco: Not on file  . Alcohol Use: Yes    FAMILY HISTORY:   Family History  Problem Relation Age of Onset  . Cancer Mother   . Emphysema Father     DRUG ALLERGIES:  No Known Allergies  REVIEW OF SYSTEMS:   ROS  CONSTITUTIONAL: No fever, fatigue or weakness.  EYES: No blurred or double vision.  EARS, NOSE, AND THROAT: No tinnitus or ear pain.  RESPIRATORY: No cough, shortness of breath, wheezing or hemoptysis.  CARDIOVASCULAR: No chest pain, orthopnea, edema.  GASTROINTESTINAL: No nausea, vomiting, diarrhea or abdominal pain.  GENITOURINARY: No dysuria, hematuria.  ENDOCRINE: No polyuria, nocturia,  HEMATOLOGY: No anemia, easy bruising or bleeding SKIN: No rash or lesion. MUSCULOSKELETAL: No joint pain or arthritis.   NEUROLOGIC: No tingling, numbness, weakness.  PSYCHIATRY: No  anxiety or depression.   MEDICATIONS AT HOME:   Prior to Admission medications   Medication Sig Start Date End Date Taking? Authorizing Provider  albuterol (PROVENTIL HFA;VENTOLIN HFA) 108 (90 BASE) MCG/ACT inhaler Inhale 2 puffs into the lungs every 6 (six) hours as needed for wheezing or shortness of breath.   Yes Historical Provider, MD  allopurinol (ZYLOPRIM) 300 MG tablet Take 300 mg by mouth daily.   Yes Historical Provider, MD  amoxicillin-clavulanate (AUGMENTIN)  875-125 MG per tablet Take 1 tablet by mouth 2 (two) times daily. 02/22/15 03/01/15 Yes Historical Provider, MD  colchicine 0.6 MG tablet Take 0.6 mg by mouth daily.   Yes Historical Provider, MD  digoxin (LANOXIN) 0.25 MG tablet Take 0.25 mg by mouth daily.   Yes Historical Provider, MD  diltiazem (CARDIZEM CD) 300 MG 24 hr capsule Take 300 mg by mouth daily.   Yes Historical Provider, MD  ethambutol (MYAMBUTOL) 400 MG tablet Take 1,600 mg by mouth daily.   Yes Historical Provider, MD  Fluticasone Furoate-Vilanterol 100-25 MCG/INH AEPB Inhale 1 puff into the lungs daily.    Yes Historical Provider, MD  furosemide (LASIX) 40 MG tablet Take 40 mg by mouth daily.   Yes Historical Provider, MD  HYDROcodone-homatropine (HYCODAN) 5-1.5 MG/5ML syrup Take 5 mLs by mouth every 6 (six) hours as needed for cough.   Yes Historical Provider, MD  isoniazid (NYDRAZID) 300 MG tablet Take 300 mg by mouth daily.   Yes Historical Provider, MD  losartan (COZAAR) 50 MG tablet Take 50 mg by mouth daily.   Yes Historical Provider, MD  lovastatin (MEVACOR) 20 MG tablet Take 20 mg by mouth daily.   Yes Historical Provider, MD  metoprolol (LOPRESSOR) 50 MG tablet Take 50 mg by mouth 2 (two) times daily.   Yes Historical Provider, MD  pyrazinamide 500 MG tablet Take 2,000 mg by mouth daily.   Yes Historical Provider, MD  rifampin (RIFADIN) 300 MG capsule Take 600 mg by mouth daily.   Yes Historical Provider, MD  vitamin B-6 (PYRIDOXINE) 25 MG tablet Take 25 mg by mouth daily.   Yes Historical Provider, MD  warfarin (COUMADIN) 10 MG tablet Take 10 mg by mouth daily. Pt takes 10 mg on Saturday and Sunday.   Yes Historical Provider, MD  warfarin (COUMADIN) 4 MG tablet Take 8 mg by mouth daily. Pt takes 8 mg on Monday-Friday.   Yes Historical Provider, MD      VITAL SIGNS:  Blood pressure 67/44, pulse 106, temperature 98.3 F (36.8 C), temperature source Oral, resp. rate 33, height  (1.803 m), weight 153.316 kg (338 lb),  SpO2 100 %.  PHYSICAL EXAMINATION:   Physical Exam  GENERAL:  53 y.o.-year obese patient sitting in the bed with no acute distress.  EYES: Pupils equal, round, reactive to light and accommodation. No scleral icterus. Extraocular muscles intact.  HEENT: Head atraumatic, normocephalic. Oropharynx and nasopharynx clear.  NECK:  Supple, no jugular venous distention. No thyroid enlargement, no tenderness.  LUNGS:scattered wheezes bilaterally, no rales,rhonchi or crepitation. No use of accessory muscles of respiration. On 5l o2 CARDIOVASCULAR: S1, S2 normal. No murmurs, rubs, or gallops.  ABDOMEN: Soft, nontender, nondistended. Bowel sounds present. No organomegaly or mass.  EXTREMITIES: 2+ bilateral pedal edema, No cyanosis, or clubbing.  NEUROLOGIC: Cranial nerves II through XII are intact. Muscle strength 5/5 in all extremities. Sensation intact. Gait not checked.  PSYCHIATRIC: The patient is alert and oriented x 3.  SKIN: No obvious rash, lesion, or  ulcer.   LABORATORY PANEL:   CBC  Recent Labs Lab 02/28/15 1348  WBC 7.3  HGB 11.9*  HCT 38.2*  PLT 47*   ------------------------------------------------------------------------------------------------------------------  Chemistries   Recent Labs Lab 02/28/15 1348  NA 141  K 3.9  CL 100*  CO2 32  GLUCOSE 95  BUN 6  CREATININE 0.82  CALCIUM 8.4*  AST 27  ALT 18  ALKPHOS 77  BILITOT 0.9   ------------------------------------------------------------------------------------------------------------------  Cardiac Enzymes  Recent Labs Lab 02/28/15 1348  TROPONINI <0.03   ------------------------------------------------------------------------------------------------------------------  RADIOLOGY:  Dg Chest 2 View  02/28/2015   CLINICAL DATA:  Shortness of breath  EXAM: CHEST  2 VIEW  COMPARISON:  02/07/2015  FINDINGS: Patchy perihilar airspace opacities are reidentified, minimally improved since previously. Trace  pleural effusions are noted. Right IJ central line has been removed. No pneumothorax. No acute osseous finding.  IMPRESSION: Minimal interval improvement in multi lobar airspace consolidation most compatible with previously seen pneumonia.   Electronically Signed   By: Christiana Pellant M.D.   On: 02/28/2015 14:40    EKG:   Orders placed or performed during the hospital encounter of 02/28/15  . ED EKG  . ED EKG    IMPRESSION AND PLAN:   Lennart Gladish  is a obese 54 y.o. male with a known history of A. fib on Coumadin, hypertension, COPD on 5 L home oxygen, sleep apnea on CPAP, being treated for tuberculosis by health department, recent admission to the hospital 2 weeks ago for pneumonia comes again secondary to worsening dyspnea.  #1 acute dyspnea-discussed in detail with Dr. Sampson Goon about his chest x-ray from today and his recent CT and chest x-ray. He thinks this is more of COPD exacerbation and recurrent pneumonia. Will continue his Augmentin which was started by Dr. Meredeth Ide in the office on 02/23/2015. We'll continue it for a total of 7 days. At this time will start IV steroids and also do an absent treat his COPD aggressively. Continue CPAP at bedtime. Pulmonary has been consulted to see if patient will need any bronchoscopy again. CT of the chest with IV contrast to rule out PE ordered since his INR is subtherapeutic. Also Dopplers of lower extremities ordered to rule out DVT because of 2+ edema. Echo was done recently so we'll need to verify that.  #2 pulmonary tuberculosis-diagnosed about 2-3 months ago. Being followed by health department for directly observed therapy. He is on rifampin and INH and pyrazinamide at this time. His albuterol was just stopped 3 days ago since sensitivities were available. Per infectious disease specialist no need for any airborne isolation at this time as patient is being treated.  #3 obstructive sleep apnea-continue CPAP.  #4 atrial fibrillation rate  controlled. On digoxin, Cardizem and metoprolol. Continue Coumadin. INR is subtherapeutic. Pharmacy has been consulted.  #5 acute thrombocytopenia-last platelets at the time of discharge were greater than 200 K. Currently platelets with significant drop. Repeat platelet count. If still low, will order platelet antibodies, will hold Coumadin then. And will have oncology consult.   All the records are reviewed and case discussed with ED provider. Management plans discussed with the patient, family and they are in agreement.  CODE STATUS: Full code  TOTAL TIME TAKING CARE OF THIS PATIENT: 55 minutes.    Enid Baas M.D on 02/28/2015 at 5:25 PM  Between 7am to 6pm - Pager - [phone]  After 6pm go to [email or web address] - password [email or web address]  [facility]  Office  [phone]  CC:  Primary care physician; Marisue IvanLINTHAVONG, KANHKA, MD

## 2015-02-28 NOTE — Progress Notes (Signed)
SPOKE WITH DR. KALISETTI REGARDING CPAP VS BIPAP DUE TO ABG RESULT AND RESPIRATORY STATUS. RESPIRATORY THERAPY MADE AWARE OF PATIENTS STATUS RECOMMENDED BIPAP AS WELL. MD ACKNOWLEDGED AND STATED WILL PLACE ORDER FOR BIPAP [name]

## 2015-03-01 ENCOUNTER — Encounter: Payer: Self-pay | Admitting: Internal Medicine

## 2015-03-01 LAB — BASIC METABOLIC PANEL
ANION GAP: 7 (ref 5–15)
BUN: 8 mg/dL (ref 6–20)
CHLORIDE: 100 mmol/L — AB (ref 101–111)
CO2: 32 mmol/L (ref 22–32)
Calcium: 8.1 mg/dL — ABNORMAL LOW (ref 8.9–10.3)
Creatinine, Ser: 0.78 mg/dL (ref 0.61–1.24)
GFR calc non Af Amer: >60 mL/min (ref >60)
Glucose, Bld: 140 mg/dL — ABNORMAL HIGH (ref 65–99)
Potassium: 4 mmol/L (ref 3.5–5.1)
Sodium: 139 mmol/L (ref 135–145)

## 2015-03-01 LAB — CBC
HEMATOCRIT: 36.3 % — AB (ref 40.0–52.0)
Hemoglobin: 11.4 g/dL — ABNORMAL LOW (ref 13.0–18.0)
MCH: 26.5 pg (ref 26.0–34.0)
MCHC: 31.5 g/dL — AB (ref 32.0–36.0)
MCV: 84.2 fL (ref 80.0–100.0)
PLATELETS: 177 10*3/uL (ref 150–440)
RBC: 4.31 MIL/uL — ABNORMAL LOW (ref 4.40–5.90)
RDW: 19.7 % — AB (ref 11.5–14.5)
WBC: 6 10*3/uL (ref 3.8–10.6)

## 2015-03-01 LAB — PROTIME-INR
INR: 1.24
PROTHROMBIN TIME: 15.8 s — AB (ref 11.4–15.0)

## 2015-03-01 MED ORDER — IPRATROPIUM-ALBUTEROL 0.5-2.5 (3) MG/3ML IN SOLN: 3.0000 mL | RESPIRATORY_TRACT | Status: DC | PRN

## 2015-03-01 MED ORDER — ZOLPIDEM TARTRATE 5 MG PO TABS
5.0000 mg | ORAL_TABLET | Freq: Every evening | ORAL | Status: DC | PRN
  Filled 2015-03-01: qty 1

## 2015-03-01 MED ORDER — WARFARIN SODIUM 7.5 MG PO TABS
10.0000 mg | ORAL_TABLET | Freq: Every day | ORAL | Status: DC
  Administered 2015-03-01: 10 mg via ORAL
  Filled 2015-03-01: qty 1

## 2015-03-01 NOTE — Progress Notes (Signed)
Alert and oriented. No complaints of pain. Sinus tach with frequent PVC's on tele. On 5L of oxygen, chronic. Bipap used while sleeping. Infectious disease rounded on patient today due to history of TB. Pulmonary consulted today as well. Patient is still having shortness of breath with exertion and sats dropped to the low 80's with exertion while on oxygen with physical therapy. Will continue to monitor.

## 2015-03-01 NOTE — Consult Note (Signed)
Reason for Consult: TB PNA, sob   Referring Physician: Romero Belling  Principal Problem:   Dyspnea Active Problems:   A-fib    HPI: Jonathan Provost Sr. is a 54 y.o. 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  He has cleared his sputum AFB but continues to have SOB, DOE and productive cough.  He has not had recurrent fevers.   He also has a hx of  A. fib on Coumadin, hypertension, COPD on 5 L home oxygen, sleep apnea on CPAP.  He was also evaluated for possible aspiration and has been on PPI. During his last admission he was treated with steroids and abx as well as his TB meds and his symptoms improved significantly and he was discharged home on prednisone taper about 2 weeks ago.  He was started on Augmentin by Dr. Gust Brooms office.   Past Medical History  Diagnosis Date  . Atrial fibrillation   . Hypertension   . Tuberculosis   . Sleep apnea   . COPD (chronic obstructive pulmonary disease)     on 5l home oxygen  . Obesity   . Gout   . Hyperlipidemia    Past Surgical History  Procedure Laterality Date  . Bronchoscopy      Allergies: No Known Allergies  Current antibiotics: Antibiotics Given (last 72 hours)    Date/Time Action Medication Dose   02/28/15 2256 Given   amoxicillin-clavulanate (AUGMENTIN) 875-125 MG per tablet 1 tablet 1 tablet   03/01/15 0946 Given   rifampin (RIFADIN) capsule 600 mg 600 mg   03/01/15 0947 Given   isoniazid (NYDRAZID) tablet 300 mg 300 mg   03/01/15 0947 Given   pyrazinamide tablet 2,000 mg 2,000 mg   03/01/15 0948 Given   amoxicillin-clavulanate (AUGMENTIN) 875-125 MG per tablet 1 tablet 1 tablet     MEDICATIONS: . allopurinol  300 mg Oral Daily  . amoxicillin-clavulanate  1 tablet Oral BID  . colchicine  0.6 mg Oral Daily  . digoxin  0.25 mg Oral Daily  . diltiazem  300 mg Oral Daily  . furosemide  40 mg Oral Daily  . ipratropium-albuterol  3 mL Nebulization Q4H  .  isoniazid  300 mg Oral Daily  . losartan  50 mg Oral Daily  . methylPREDNISolone (SOLU-MEDROL) injection  60 mg Intravenous 3 times per day  . metoprolol  50 mg Oral BID  . mometasone-formoterol  2 puff Inhalation BID  . pravastatin  20 mg Oral q1800  . pyrazinamide  2,000 mg Oral Daily  . pyridOXINE  25 mg Oral Daily  . rifampin  600 mg Oral Daily  . warfarin  10 mg Oral Q0600    History  Substance Use Topics  . Smoking status: Never Smoker   . Smokeless tobacco: Not on file  . Alcohol Use: Yes    Family History  Problem Relation Age of Onset  . Cancer Mother   . Emphysema Father     Review of Systems - 11 systems reviewed and negative per HPI   OBJECTIVE: Temp:  [97.8 F (36.6 C)-98.6 F (37 C)] 97.8 F (36.6 C) (05/10 1131) Pulse Rate:  [72-114] 93 (05/10 1131) Resp:  [18-50] 20 (05/10 1131) BP: (67-123)/(44-108) 94/67 mmHg (05/10 1131) SpO2:  [92 %-100 %] 98 % (05/10 1131) FiO2 (%):  [30 %] 30 % (05/10 0408) Weight:  [150.776 kg (332 lb 6.4 oz)-151.819 kg (334 lb 11.2 oz)] 150.776 kg (332 lb 6.4  oz) (05/10 0544) GENERAL: morbidly obese patient sitting in the bed  EYES: Pupils equal, round, reactive to light and accommodation. No scleral icterus. Extraocular muscles intact.  HEENT: Head atraumatic, normocephalic. Oropharynx and nasopharynx clear.  NECK: Supple, no jugular venous distention. No thyroid enlargement, no tenderness.  LUNGS:scattered wheezes bilaterally, + rhonchiNo use of accessory muscles of respiration. On 5l o2 CARDIOVASCULAR: S1, S2 normal. No murmurs, rubs, or gallops.  ABDOMEN: Soft, nontender, nondistended. Bowel sounds present. No organomegaly or mass.  EXTREMITIES: 2+ bilateral pedal edema, No cyanosis, or clubbing.  NEUROLOGIC: Cranial nerves II through XII are intact. Muscle strength 5/5 in all extremities. Sensation intact. Gait not checked.  PSYCHIATRIC: The patient is alert and oriented x 3.  SKIN: No obvious rash, lesion, or  ulcer.   LABS: Results for orders placed or performed during the hospital encounter of 02/28/15 (from the past 48 hour(s))  CBC with Differential     Status: Abnormal   Collection Time: 02/28/15  1:48 PM  Result Value Ref Range   WBC 7.3 3.8 - 10.6 K/uL   RBC 4.48 4.40 - 5.90 MIL/uL   Hemoglobin 11.9 (L) 13.0 - 18.0 g/dL   HCT 38.2 (L) 40.0 - 52.0 %   MCV 85.4 80.0 - 100.0 fL   MCH 26.6 26.0 - 34.0 pg   MCHC 31.1 (L) 32.0 - 36.0 g/dL   RDW 19.8 (H) 11.5 - 14.5 %   Platelets 47 (L) 150 - 440 K/uL   Neutrophils Relative % 82% %   Neutro Abs 6.1 1.4 - 6.5 K/uL   Lymphocytes Relative 5% %   Lymphs Abs 0.4 (L) 1.0 - 3.6 K/uL   Monocytes Relative 8% %   Monocytes Absolute 0.6 0.2 - 1.0 K/uL   Eosinophils Relative 4% %   Eosinophils Absolute 0.3 0 - 0.7 K/uL   Basophils Relative 1% %   Basophils Absolute 0.0 0 - 0.1 K/uL  Brain natriuretic peptide     Status: Abnormal   Collection Time: 02/28/15  1:48 PM  Result Value Ref Range   B Natriuretic Peptide 227.0 (H) 0.0 - 100.0 pg/mL  Troponin I     Status: None   Collection Time: 02/28/15  1:48 PM  Result Value Ref Range   Troponin I <0.03 <0.031 ng/mL    Comment:        NO INDICATION OF MYOCARDIAL INJURY.   Comprehensive metabolic panel     Status: Abnormal   Collection Time: 02/28/15  1:48 PM  Result Value Ref Range   Sodium 141 135 - 145 mmol/L   Potassium 3.9 3.5 - 5.1 mmol/L   Chloride 100 (L) 101 - 111 mmol/L   CO2 32 22 - 32 mmol/L   Glucose, Bld 95 65 - 99 mg/dL   BUN 6 6 - 20 mg/dL   Creatinine, Ser 0.82 0.61 - 1.24 mg/dL   Calcium 8.4 (L) 8.9 - 10.3 mg/dL   Total Protein 7.2 6.5 - 8.1 g/dL   Albumin 3.0 (L) 3.5 - 5.0 g/dL   AST 27 15 - 41 U/L   ALT 18 17 - 63 U/L   Alkaline Phosphatase 77 38 - 126 U/L   Total Bilirubin 0.9 0.3 - 1.2 mg/dL   GFR calc non Af Amer >60 >60 mL/min   GFR calc Af Amer >60 >60 mL/min    Comment: (NOTE) The eGFR has been calculated using the CKD EPI equation. This calculation has not  been validated in all clinical situations. eGFR's persistently <  60 mL/min signify possible Chronic Kidney Disease.    Anion gap 9 5 - 15  Protime-INR     Status: None   Collection Time: 02/28/15  1:48 PM  Result Value Ref Range   Prothrombin Time 13.9 11.4 - 15.0 seconds   INR 1.05   Blood gas, venous     Status: Abnormal (Preliminary result)   Collection Time: 02/28/15  1:48 PM  Result Value Ref Range   FIO2 PENDING %   Mode PENDING    pH, Ven 7.34 7.320 - 7.430   pCO2, Ven 69 (H) 44.0 - 60.0 mmHg   Bicarbonate 37.2 (H) 21.0 - 28.0 mEq/L   Acid-Base Excess 8.7 (H) 0.0 - 3.0 mmol/L   O2 Saturation PENDING %   Patient temperature 37.0    Collection site VEIN    Drawn by PENDING    Sample type VENOUS   Platelet count     Status: None   Collection Time: 02/28/15  6:38 PM  Result Value Ref Range   Platelets 183 150 - 440 K/uL    Comment: PLATELET CLUMPING OBSERVED ON SMEAR FOR ACCESSION [id number]. THE RESULT OF 47 IS LOWER THAN EXPECTED DUE TO CLUMPING. TSH 02/28/15. TSH  Basic metabolic panel     Status: Abnormal   Collection Time: 03/01/15  4:41 AM  Result Value Ref Range   Sodium 139 135 - 145 mmol/L   Potassium 4.0 3.5 - 5.1 mmol/L   Chloride 100 (L) 101 - 111 mmol/L   CO2 32 22 - 32 mmol/L   Glucose, Bld 140 (H) 65 - 99 mg/dL   BUN 8 6 - 20 mg/dL   Creatinine, Ser 0.78 0.61 - 1.24 mg/dL   Calcium 8.1 (L) 8.9 - 10.3 mg/dL   GFR calc non Af Amer >60 >60 mL/min   GFR calc Af Amer >60 >60 mL/min    Comment: (NOTE) The eGFR has been calculated using the CKD EPI equation. This calculation has not been validated in all clinical situations. eGFR's persistently <60 mL/min signify possible Chronic Kidney Disease.    Anion gap 7 5 - 15  CBC     Status: Abnormal   Collection Time: 03/01/15  4:41 AM  Result Value Ref Range   WBC 6.0 3.8 - 10.6 K/uL   RBC 4.31 (L) 4.40 - 5.90 MIL/uL   Hemoglobin 11.4 (L) 13.0 - 18.0 g/dL   HCT 36.3 (L) 40.0 - 52.0 %   MCV 84.2 80.0 - 100.0 fL    MCH 26.5 26.0 - 34.0 pg   MCHC 31.5 (L) 32.0 - 36.0 g/dL   RDW 19.7 (H) 11.5 - 14.5 %   Platelets 177 150 - 440 K/uL  Protime-INR     Status: Abnormal   Collection Time: 03/01/15  4:41 AM  Result Value Ref Range   Prothrombin Time 15.8 (H) 11.4 - 15.0 seconds   INR 1.24    No components found for: ESR, C REACTIVE PROTEIN MICRO:  IMAGING: Dg Chest 2 View  02/28/2015   CLINICAL DATA:  Shortness of breath  EXAM: CHEST  2 VIEW  COMPARISON:  02/07/2015  FINDINGS: Patchy perihilar airspace opacities are reidentified, minimally improved since previously. Trace pleural effusions are noted. Right IJ central line has been removed. No pneumothorax. No acute osseous finding.  IMPRESSION: Minimal interval improvement in multi lobar airspace consolidation most compatible with previously seen pneumonia.   Electronically Signed   By: Conchita Paris M.D.   On: 02/28/2015 14:40   Ct  Angio Chest Pe W/cm &/or Wo Cm  02/28/2015   CLINICAL DATA:  Increasing shortness of breath with exertion for the past week.  EXAM: CT ANGIOGRAPHY CHEST WITH CONTRAST  TECHNIQUE: Multidetector CT imaging of the chest was performed using the standard protocol during bolus administration of intravenous contrast. Multiplanar CT image reconstructions and MIPs were obtained to evaluate the vascular anatomy.  CONTRAST:  174m OMNIPAQUE IOHEXOL 350 MG/ML SOLN  COMPARISON:  Radiographs obtained earlier today and chest CT dated 01/05/2015.  FINDINGS: Suboptimally opacified a pulmonary arteries. There is also limitation due to photon starvation as a result of the large size of the patient. No pulmonary arterial filling defects are seen. Small left pleural effusion with a mild decrease in amount.  Interval multiple areas of cavitation within confluent opacity in the right upper lobe and left upper lobe. This is also involving the right lower lobe. The areas of consolidation are smaller and less component than on the previous examination. A  previously demonstrated larger air cavitation in the lingula is significantly smaller. This previously measures 6.7 cm in maximum diameter and currently measures 3.5 cm in maximum diameter.  Patchy areas of interstitial prominence in both lungs with mild progression. The azygos vein remains enlarged at the level of the distal trachea. An enlarged subcarinal node is slightly more prominent, currently with a short axis diameter of 19 mm on image number 67 and previously with a short axis diameter of 17 mm.  A moderate-sized hiatal hernia is again demonstrated. Small pericardial effusion with a maximum thickness of 1.7 cm. The included upper abdomen is unremarkable. Thoracic spine degenerative changes.  Review of the MIP images confirms the above findings.  IMPRESSION: 1. Suboptimal visualization of the pulmonary arteries with no visible pulmonary emboli. 2. Bilateral cavitary pneumonia with an overall increase in cavitation and decrease in consolidation. Differential considerations include Klebsiella, Staphylococcus and fungal infections. Tuberculosis also needs to be excluded. 3. Small left pleural effusion, mildly improved. 4. Mild probable reactive subcarinal adenopathy with mild progression. 5. Small pericardial effusion. 6. Moderate-sized hiatal hernia.   Electronically Signed   By: SClaudie ReveringM.D.   On: 02/28/2015 20:16   UKoreaExtrem Low Bilat Comp  02/28/2015   CLINICAL DATA:  Dyspnea for the past 3 months.  EXAM: BILATERAL LOWER EXTREMITY VENOUS DOPPLER ULTRASOUND  TECHNIQUE: Gray-scale sonography with graded compression, as well as color Doppler and duplex ultrasound were performed to evaluate the lower extremity deep venous systems from the level of the common femoral vein and including the common femoral, femoral, profunda femoral, popliteal and calf veins including the posterior tibial, peroneal and gastrocnemius veins when visible. The superficial great saphenous vein was also interrogated. Spectral  Doppler was utilized to evaluate flow at rest and with distal augmentation maneuvers in the common femoral, femoral and popliteal veins.  COMPARISON:  None.  FINDINGS: RIGHT LOWER EXTREMITY  Common Femoral Vein: No evidence of thrombus. Normal compressibility, respiratory phasicity and response to augmentation.  Saphenofemoral Junction: No evidence of thrombus. Normal compressibility and flow on color Doppler imaging.  Profunda Femoral Vein: No evidence of thrombus. Normal compressibility and flow on color Doppler imaging.  Femoral Vein: No evidence of thrombus. Normal compressibility, respiratory phasicity and response to augmentation.  Popliteal Vein: No evidence of thrombus. Normal compressibility, respiratory phasicity and response to augmentation.  Calf Veins: No evidence of thrombus. Normal compressibility and flow on color Doppler imaging.  Superficial Great Saphenous Vein: No evidence of thrombus. Normal compressibility and flow on  color Doppler imaging.  Venous Reflux:  None.  Other Findings:  None.  LEFT LOWER EXTREMITY  Common Femoral Vein: No evidence of thrombus. Normal compressibility, respiratory phasicity and response to augmentation.  Saphenofemoral Junction: No evidence of thrombus. Normal compressibility and flow on color Doppler imaging.  Profunda Femoral Vein: No evidence of thrombus. Normal compressibility and flow on color Doppler imaging.  Femoral Vein: No evidence of thrombus. Normal compressibility, respiratory phasicity and response to augmentation.  Popliteal Vein: No evidence of thrombus. Normal compressibility, respiratory phasicity and response to augmentation.  Calf Veins: Near occlusive thrombosis in one of the left posterior tibial veins.  Superficial Great Saphenous Vein: No evidence of thrombus. Normal compressibility and flow on color Doppler imaging.  Venous Reflux:  None.  Other Findings:  None.  IMPRESSION: Near occlusive thrombus in one of the left posterior tibial veins. No  other evidence of deep venous thrombosis.   Electronically Signed   By: Claudie Revering M.D.   On: 02/28/2015 19:50    HISTORICAL MICRO/IMAGING  Assessment/Plan:  54 yo with known active TB on treatment for approx 2 months readmitted with progressive DOE, SOB.  CT which I have reviewed and interpreted continues to show destructive cavitation of a significant amount of lung tissue.  Since admit he has not had a fever and his wbc is normal.  He has been tolerating his TB meds.  I suspect he does not have a superinfection or any worsening of his TB but rather progressive COPD or possibly IRIS. He already feels a little better with starting steroids  Rec Cont TB meds Agree with steroids and nebs -  May benefit from slower Prednisone taper over 3-4 weeks. Agree with pulm consult to optimize pulmonary toilet and consider bronch wash. Thank you for the consult.

## 2015-03-01 NOTE — Progress Notes (Signed)
ANTICOAGULATION CONSULT NOTE - Follow Up Consult  Pharmacy Consult for warfarin Indication: atrial fibrillation  No Known Allergies  Patient Measurements: Height: 5\' 11"  (180.3 cm) Weight: (!) 332 lb 6.4 oz (150.776 kg) IBW/kg (Calculated) : 75.3   Vital Signs: Temp: 97.9 F (36.6 C) (05/10 0544) Temp Source: Oral (05/10 0544) BP: 105/62 mmHg (05/10 0544) Pulse Rate: 90 (05/10 0544)  Labs:  Recent Labs  02/28/15 1348 02/28/15 1838 03/01/15 0441  HGB 11.9*  --  11.4*  HCT 38.2*  --  36.3*  PLT 47* 183 177  LABPROT 13.9  --  15.8*  INR 1.05  --  1.24  CREATININE 0.82  --  0.78  TROPONINI <0.03  --   --     Estimated Creatinine Clearance: 155.7 mL/min (by C-G formula based on Cr of 0.78).   Medications:  Scheduled:  . allopurinol  300 mg Oral Daily  . amoxicillin-clavulanate  1 tablet Oral BID  . colchicine  0.6 mg Oral Daily  . digoxin  0.25 mg Oral Daily  . diltiazem  300 mg Oral Daily  . furosemide  40 mg Oral Daily  . ipratropium-albuterol  3 mL Nebulization Q4H  . isoniazid  300 mg Oral Daily  . losartan  50 mg Oral Daily  . methylPREDNISolone (SOLU-MEDROL) injection  60 mg Intravenous 3 times per day  . metoprolol  50 mg Oral BID  . mometasone-formoterol  2 puff Inhalation BID  . pravastatin  20 mg Oral q1800  . pyrazinamide  2,000 mg Oral Daily  . pyridOXINE  25 mg Oral Daily  . rifampin  600 mg Oral Daily  . warfarin  10 mg Oral Q0600    Assessment: Patient is a 54 yo male admitted for shortness of breath.  Patient receiving treatment for TB.  Patient was also recently started on Augmentin therapy as an outpatient.  Patient takes warfarin chronically for atrial fibrillation.  Home dosing of warfarin is 8 mg Monday-Friday and 10 mg on Saturday and Sunday.  INR History: 5/9 - 1.05, per patient took 8 mg dose on this day 5/10 - 1.24  Goal of Therapy:  INR 2-3   Plan:  INR subtherapeutic.  Patient reports taking warfarin per medication  reconciliation but INR below goal range upon admission.  Will increase warfarin dose to 10 mg daily as rifampin can decrease warfarin's effects on the INR warranting higher doses.  Will recheck INR in AM.  Clarisa Schoolsrystal Leanord Thibeau, PharmD Clinical Pharmacist 03/01/2015

## 2015-03-01 NOTE — Progress Notes (Signed)
[facility] [place] [facility]   PATIENT NAME: Jonathan [name] Teters    MR#:  [id number]  DATE OF BIRTH:  08/16/1961   PRIMARY CARE PHYSICIAN: Marisue IvanLINTHAVONG, KANHKA, MD   REQUESTING/REFERRING PHYSICIAN: Dr. Mayford KnifeWilliams  SUBJECTIVE:  CHIEF COMPLAINT:   Chief Complaint  Patient presents with  . Shortness of Breath   Jonathan [name] Delacruz is a obese 54 y.o. male with a known history of A. fib on Coumadin, hypertension, COPD on 5 L home oxygen, sleep apnea on CPAP, being treated for tuberculosis by health department, recent admission to the hospital 2 weeks ago for pneumonia comes again secondary to COPD exacerbation with pneumonia.  States he feels a little better this morning. Still has wheezing and cough.  REVIEW OF SYSTEMS:    ROS  Constitutional: Positive for malaise/fatigue. Negative for fever and chills.  HENT: Negative for ear pain, hearing loss and tinnitus.  Eyes: Negative for blurred vision, double vision, pain and redness.  Respiratory: Positive for cough and shortness of breath. Negative for hemoptysis.  Cardiovascular: Positive for leg swelling (mild chroinc). Negative for chest pain, palpitations and orthopnea.  Gastrointestinal: Negative for nausea, vomiting, abdominal pain, diarrhea and constipation.  Genitourinary: Negative for dysuria, frequency and hematuria.  Musculoskeletal: Negative for myalgias and joint pain.   No acute arthritis or gout  Skin:   No acne, rash, or lesions  Neurological: Negative for dizziness, tremors, focal weakness and weakness.  Endo/Heme/Allergies: Negative for polydipsia. Does not bruise/bleed easily.  Psychiatric/Behavioral: Negative for depression. The patient is not nervous/anxious and does not have insomnia.   DRUG ALLERGIES:  No Known Allergies  VITALS:  Blood pressure 118/71, pulse 114, temperature 97.9 F (36.6 C), temperature source Oral, resp. rate 20, height 5\' 11"  (1.803 m), weight 150.776 kg  (332 lb 6.4 oz), SpO2 97 %.  PHYSICAL EXAMINATION:   Physical Exam  Constitutional: He is oriented to person, place, and time and well-developed, well-nourished, and in no distress.  HENT:  Head: Normocephalic and atraumatic.  Mouth/Throat: Oropharynx is clear and moist.  No hearing loss  Eyes: Conjunctivae and EOM are normal. Pupils are equal, round, and reactive to light. Right eye exhibits no discharge. Left eye exhibits no discharge. No scleral icterus.  Neck: Normal range of motion. Neck supple. No JVD present. No thyromegaly present.  Cardiovascular: Normal rate, regular rhythm, normal heart sounds and intact distal pulses. Exam reveals no gallop and no friction rub.  No murmur heard. Pulmonary/Chest: Effort normal. No respiratory distress. He has wheezes. He has no rales.  Abdominal: Soft. Bowel sounds are normal. He exhibits no distension. There is no tenderness.  Musculoskeletal: Normal range of motion. He exhibits edema (1+ pedal edema). He exhibits no tenderness.  Np cyanosis, no clubbing  Neurological: He is alert and oriented to person, place, and time. No cranial nerve deficit.  No dysarthria, no aphasia  Skin: Skin is warm and dry. No rash noted. No erythema.  Psychiatric: Mood, memory, affect and judgment normal.   LABORATORY PANEL:   CBC  Recent Labs Lab 03/01/15 0441  WBC 6.0  HGB 11.4*  HCT 36.3*  PLT 177   ------------------------------------------------------------------------------------------------------------------  Chemistries   Recent Labs Lab 02/28/15 1348 03/01/15 0441  NA 141 139  K 3.9 4.0  CL 100* 100*  CO2 32 32  GLUCOSE 95 140*  BUN 6 8  CREATININE 0.82 0.78  CALCIUM 8.4* 8.1*  AST 27  --   ALT 18  --   ALKPHOS 77  --  BILITOT 0.9  --    ------------------------------------------------------------------------------------------------------------------  Cardiac Enzymes  Recent Labs Lab 02/28/15 1348  TROPONINI <0.03    ------------------------------------------------------------------------------------------------------------------  RADIOLOGY:  Dg Chest 2 View  02/28/2015   CLINICAL DATA:  Shortness of breath  EXAM: CHEST  2 VIEW  COMPARISON:  02/07/2015  FINDINGS: Patchy perihilar airspace opacities are reidentified, minimally improved since previously. Trace pleural effusions are noted. Right IJ central line has been removed. No pneumothorax. No acute osseous finding.  IMPRESSION: Minimal interval improvement in multi lobar airspace consolidation most compatible with previously seen pneumonia.   Electronically Signed   By: Christiana Pellant M.D.   On: 02/28/2015 14:40   Ct Angio Chest Pe W/cm &/or Wo Cm  02/28/2015   CLINICAL DATA:  Increasing shortness of breath with exertion for the past week.  EXAM: CT ANGIOGRAPHY CHEST WITH CONTRAST  TECHNIQUE: Multidetector CT imaging of the chest was performed using the standard protocol during bolus administration of intravenous contrast. Multiplanar CT image reconstructions and MIPs were obtained to evaluate the vascular anatomy.  CONTRAST:  OMNIPAQUE IOHEXOL 350 MG/ML SOLN  COMPARISON:  Radiographs obtained earlier today and chest CT dated 01/05/2015.  FINDINGS: Suboptimally opacified a pulmonary arteries. There is also limitation due to photon starvation as a result of the large size of the patient. No pulmonary arterial filling defects are seen. Small left pleural effusion with a mild decrease in amount.  Interval multiple areas of cavitation within confluent opacity in the right upper lobe and left upper lobe. This is also involving the right lower lobe. The areas of consolidation are smaller and less component than on the previous examination. A previously demonstrated larger air cavitation in the lingula is significantly smaller. This previously measures 6.7 cm in maximum diameter and currently measures 3.5 cm in maximum diameter.  Patchy areas of interstitial  prominence in both lungs with mild progression. The azygos vein remains enlarged at the level of the distal trachea. An enlarged subcarinal node is slightly more prominent, currently with a short axis diameter of 19 mm on image number 67 and previously with a short axis diameter of 17 mm.  A moderate-sized hiatal hernia is again demonstrated. Small pericardial effusion with a maximum thickness of 1.7 cm. The included upper abdomen is unremarkable. Thoracic spine degenerative changes.  Review of the MIP images confirms the above findings.  IMPRESSION: 1. Suboptimal visualization of the pulmonary arteries with no visible pulmonary emboli. 2. Bilateral cavitary pneumonia with an overall increase in cavitation and decrease in consolidation. Differential considerations include Klebsiella, Staphylococcus and fungal infections. Tuberculosis also needs to be excluded. 3. Small left pleural effusion, mildly improved. 4. Mild probable reactive subcarinal adenopathy with mild progression. 5. Small pericardial effusion. 6. Moderate-sized hiatal hernia.   Electronically Signed   By: Beckie Salts M.D.   On: 02/28/2015 20:16   [place] Extrem Low Bilat Comp  02/28/2015   CLINICAL DATA:  Dyspnea for the past 3 months.  EXAM: BILATERAL LOWER EXTREMITY VENOUS DOPPLER ULTRASOUND  TECHNIQUE: Gray-scale sonography with graded compression, as well as color Doppler and duplex ultrasound were performed to evaluate the lower extremity deep venous systems from the level of the common femoral vein and including the common femoral, femoral, profunda femoral, popliteal and calf veins including the posterior tibial, peroneal and gastrocnemius veins when visible. The superficial great saphenous vein was also interrogated. Spectral Doppler was utilized to evaluate flow at rest and with distal augmentation maneuvers in the common femoral, femoral and popliteal veins.  COMPARISON:  None.  FINDINGS: RIGHT LOWER EXTREMITY  Common Femoral Vein: No evidence  of thrombus. Normal compressibility, respiratory phasicity and response to augmentation.  Saphenofemoral Junction: No evidence of thrombus. Normal compressibility and flow on color Doppler imaging.  Profunda Femoral Vein: No evidence of thrombus. Normal compressibility and flow on color Doppler imaging.  Femoral Vein: No evidence of thrombus. Normal compressibility, respiratory phasicity and response to augmentation.  Popliteal Vein: No evidence of thrombus. Normal compressibility, respiratory phasicity and response to augmentation.  Calf Veins: No evidence of thrombus. Normal compressibility and flow on color Doppler imaging.  Superficial Great Saphenous Vein: No evidence of thrombus. Normal compressibility and flow on color Doppler imaging.  Venous Reflux:  None.  Other Findings:  None.  LEFT LOWER EXTREMITY  Common Femoral Vein: No evidence of thrombus. Normal compressibility, respiratory phasicity and response to augmentation.  Saphenofemoral Junction: No evidence of thrombus. Normal compressibility and flow on color Doppler imaging.  Profunda Femoral Vein: No evidence of thrombus. Normal compressibility and flow on color Doppler imaging.  Femoral Vein: No evidence of thrombus. Normal compressibility, respiratory phasicity and response to augmentation.  Popliteal Vein: No evidence of thrombus. Normal compressibility, respiratory phasicity and response to augmentation.  Calf Veins: Near occlusive thrombosis in one of the left posterior tibial veins.  Superficial Great Saphenous Vein: No evidence of thrombus. Normal compressibility and flow on color Doppler imaging.  Venous Reflux:  None.  Other Findings:  None.  IMPRESSION: Near occlusive thrombus in one of the left posterior tibial veins. No other evidence of deep venous thrombosis.   Electronically Signed   By: Beckie SaltsSteven  Reid M.D.   On: 02/28/2015 19:50     ASSESSMENT AND PLAN:  Jonathan [name] Anzalone is a obese 54 y.o. male with a known history of A. fib on  Coumadin, hypertension, COPD on 5 L home oxygen, sleep apnea on CPAP, being treated for tuberculosis by health department, recent admission to the hospital 2 weeks ago for pneumonia comes again secondary to worsening dyspnea.  #1 acute dyspnea-  COPD exacerbation and recurrent pneumonia. continue Augmentin for a total of 7 days (started 02/23/15). on IV steroids and duonebs. Continue CPAP at bedtime. Pulmonary has been consulted to see if patient will need any bronchoscopy again. CT of the chest with IV contrast showed no PE and doppler lower extremities showed no DVT.  Echo was done recently - need to verify results.  #2 pulmonary tuberculosis-diagnosed about 2-3 months ago. on rifampin and INH and pyrazinamide at this time, followed by health department. Per infectious disease specialist no need for any airborne isolation at this time as patient is being treated.  #3 obstructive sleep apnea-continue CPAP at night.  #4 atrial fibrillation rate controlled on digoxin, diltiazem and metoprolol. Continue Coumadin. INR is subtherapeutic, monitor daily. Pharmacy has been consulted to assist with coumadin management  #5 acute thrombocytopenia-platelet count initially 47, repeat was 183 and 177, this problem is resolved, likely lab error.  All the records are reviewed and case discussed with ED provider. Management plans discussed with the patient, family and they are in agreement.  CODE STATUS: Full code  TOTAL TIME TAKING CARE OF THIS PATIENT: 40 minutes.     Sabir Charters FIELDING 03/01/2015, 10:10 AM  [facility]  Office  [phone]  CC: Primary care physician; Marisue IvanLINTHAVONG, KANHKA, MD

## 2015-03-01 NOTE — Progress Notes (Signed)
Date: 03/01/2015,   MRN# [id number] Jonathan DIVITA Sr. 1961-04-29 Code Status:     Code Status Orders        Start     Ordered   02/28/15 1821  Full code   Continuous     02/28/15 1820     Hosp day:@LENGTHOFSTAYDAYS @ Referring MD: @     PCP: Dr. Madelaine Bhat      AdmissionWeight: (!) 338 lb (153.316 kg)                 CurrentWeight: (!) 332 lb 6.4 oz (150.776 kg) Jonathan Pace Sr. is a 54 y.o. old male seen in consultation for shortness of breath    HPI: Jonathan BOURQUE Sr. is a 54 y.o. old male seen in consultation for shortness of breath. He stated yesterday he could not get his breath. There was no chest pain, pleurisy, syncope, ectopy, hemoptysis or fever. He is being treated for TB. Sputum now sterile for TB. Tolerating meds well.  He is on 5-6 liters of 02 at home. On cpap for obstructive sleep apnea. He wears his cpap nightly. Underlying copd. ocasional wheeze. He sem to think he does best on steroids. Hie was seen week or more at the office. His chest xray was showing signs of improvement. He was quite dyspneic. Hence added augmentin ? aspiration and advised to take his copd regimen faithfully. There is increase scarring ? Due to infection ? underlying ILD.     PMHX:   Past Medical History  Diagnosis Date  . Atrial fibrillation   . Hypertension   . Tuberculosis   . Sleep apnea   . COPD (chronic obstructive pulmonary disease)     on 5l home oxygen  . Obesity   . Gout   . Hyperlipidemia    Surgical Hx:  Past Surgical History  Procedure Laterality Date  . Bronchoscopy     Family Hx:  Family History  Problem Relation Age of Onset  . Cancer Mother   . Emphysema Father    Social Hx:   History  Substance Use Topics  . Smoking status: Never Smoker   . Smokeless tobacco: Not on file  . Alcohol Use: Yes   Medication:    Home Medication:  No current outpatient prescriptions on file.  Current Medication: @   Allergies:  Review of  patient's allergies indicates no known allergies.  Review of Systems: Gen:  Denies  fever, sweats, chills, appetite is good HEENT: Denies blurred vision, double vision, ear pain, eye pain, hearing loss, nose bleeds, sore throat Cvc:  No dizziness, chest pain or heaviness Resp:   Cough, no blood, pleurisy or tightness. Occasional wheezing. +ve DOE Gi: Denies swallowing difficulty, stomach pain, nausea or vomiting, diarrhea, constipation, bowel incontinence Gu:  Denies bladder incontinence, burning urine Ext:   No Joint pain, stiffness or swelling Skin: No skin rash, easy bruising or bleeding or hives Endoc:  No polyuria, polydipsia , polyphagia or weight change Psych: No depression, insomnia or hallucinations  Other:  All other systems negative  Physical Examination:   VS: BP 94/67 mmHg  Pulse 90  Temp(Src) 97.8 F (36.6 C) (Oral)  Resp 20  Ht  (1.803 m)  Wt 332 lb 6.4 oz (150.776 kg)  BMI 46.38 kg/m2  SpO2 94%  General Appearance: Mild distress , [place] 02 on 5 liters, very obese Neuro/psych: Without focal findings, mental status, speech normal, alert and oriented, cranial nerves 2-12 intact, reflexes normal and symmetric, sensation  grossly normal  HEENT: PERRLA, EOM intact, no ptosis, no other lesions noticed, Mallampati:4 Pulmonary:.Rare wheezing, distant, No rales  Sputum Production: mild  Cardiovascular:  Normal S1,S2.  No m/r/g.  Abdominal aorta pulsation normal.    Abdomen:Benign, Soft, non-tender, No masses, hepatosplenomegaly, No lymphadenopathy Endoc: No evident thyromegaly, no signs of acromegaly or Cushing features Skin:   warm, no rashes, no ecchymosis  Extremities: normal, no cyanosis, clubbing, mild edema, warm with normal capillary refill. Other findings:   Labs results:   Recent Labs     02/28/15  1348  03/01/15  0441  HGB  11.9*  11.4*  HCT  38.2*  36.3*  MCV  85.4  84.2  WBC  7.3  6.0  BUN  6  8  CREATININE  0.82  0.78  GLUCOSE  95  140*  CALCIUM   8.4*  8.1*  INR  1.05  1.24  ,    No results for input(s): PH in the last 72 hours.  Invalid input(s): PCO2, PO2, BASEEXCESS, BASEDEFICITE, TFT  Culture results:     Rad results:   Dg Chest 2 View  02/28/2015   CLINICAL DATA:  Shortness of breath  EXAM: CHEST  2 VIEW  COMPARISON:  02/07/2015  FINDINGS: Patchy perihilar airspace opacities are reidentified, minimally improved since previously. Trace pleural effusions are noted. Right IJ central line has been removed. No pneumothorax. No acute osseous finding.  IMPRESSION: Minimal interval improvement in multi lobar airspace consolidation most compatible with previously seen pneumonia.   Electronically Signed   By: Christiana PellantGretchen  Green M.D.   On: 02/28/2015 14:40   Ct Angio Chest Pe W/cm &/or Wo Cm  02/28/2015   CLINICAL DATA:  Increasing shortness of breath with exertion for the past week.  EXAM: CT ANGIOGRAPHY CHEST WITH CONTRAST  TECHNIQUE: Multidetector CT imaging of the chest was performed using the standard protocol during bolus administration of intravenous contrast. Multiplanar CT image reconstructions and MIPs were obtained to evaluate the vascular anatomy.  CONTRAST:  100mL OMNIPAQUE IOHEXOL 350 MG/ML SOLN  COMPARISON:  Radiographs obtained earlier today and chest CT dated 01/05/2015.  FINDINGS: Suboptimally opacified a pulmonary arteries. There is also limitation due to photon starvation as a result of the large size of the patient. No pulmonary arterial filling defects are seen. Small left pleural effusion with a mild decrease in amount.  Interval multiple areas of cavitation within confluent opacity in the right upper lobe and left upper lobe. This is also involving the right lower lobe. The areas of consolidation are smaller and less component than on the previous examination. A previously demonstrated larger air cavitation in the lingula is significantly smaller. This previously measures 6.7 cm in maximum diameter and currently measures 3.5 cm  in maximum diameter.  Patchy areas of interstitial prominence in both lungs with mild progression. The azygos vein remains enlarged at the level of the distal trachea. An enlarged subcarinal node is slightly more prominent, currently with a short axis diameter of 19 mm on image number 67 and previously with a short axis diameter of 17 mm.  A moderate-sized hiatal hernia is again demonstrated. Small pericardial effusion with a maximum thickness of 1.7 cm. The included upper abdomen is unremarkable. Thoracic spine degenerative changes.  Review of the MIP images confirms the above findings.  IMPRESSION: 1. Suboptimal visualization of the pulmonary arteries with no visible pulmonary emboli. 2. Bilateral cavitary pneumonia with an overall increase in cavitation and decrease in consolidation. Differential considerations include Klebsiella, Staphylococcus  and fungal infections. Tuberculosis also needs to be excluded. 3. Small left pleural effusion, mildly improved. 4. Mild probable reactive subcarinal adenopathy with mild progression. 5. Small pericardial effusion. 6. Moderate-sized hiatal hernia.   Electronically Signed   By: Beckie Salts M.D.   On: 02/28/2015 20:16   [place] Extrem Low Bilat Comp  02/28/2015   CLINICAL DATA:  Dyspnea for the past 3 months.  EXAM: BILATERAL LOWER EXTREMITY VENOUS DOPPLER ULTRASOUND  TECHNIQUE: Gray-scale sonography with graded compression, as well as color Doppler and duplex ultrasound were performed to evaluate the lower extremity deep venous systems from the level of the common femoral vein and including the common femoral, femoral, profunda femoral, popliteal and calf veins including the posterior tibial, peroneal and gastrocnemius veins when visible. The superficial great saphenous vein was also interrogated. Spectral Doppler was utilized to evaluate flow at rest and with distal augmentation maneuvers in the common femoral, femoral and popliteal veins.  COMPARISON:  None.  FINDINGS:  RIGHT LOWER EXTREMITY  Common Femoral Vein: No evidence of thrombus. Normal compressibility, respiratory phasicity and response to augmentation.  Saphenofemoral Junction: No evidence of thrombus. Normal compressibility and flow on color Doppler imaging.  Profunda Femoral Vein: No evidence of thrombus. Normal compressibility and flow on color Doppler imaging.  Femoral Vein: No evidence of thrombus. Normal compressibility, respiratory phasicity and response to augmentation.  Popliteal Vein: No evidence of thrombus. Normal compressibility, respiratory phasicity and response to augmentation.  Calf Veins: No evidence of thrombus. Normal compressibility and flow on color Doppler imaging.  Superficial Great Saphenous Vein: No evidence of thrombus. Normal compressibility and flow on color Doppler imaging.  Venous Reflux:  None.  Other Findings:  None.  LEFT LOWER EXTREMITY  Common Femoral Vein: No evidence of thrombus. Normal compressibility, respiratory phasicity and response to augmentation.  Saphenofemoral Junction: No evidence of thrombus. Normal compressibility and flow on color Doppler imaging.  Profunda Femoral Vein: No evidence of thrombus. Normal compressibility and flow on color Doppler imaging.  Femoral Vein: No evidence of thrombus. Normal compressibility, respiratory phasicity and response to augmentation.  Popliteal Vein: No evidence of thrombus. Normal compressibility, respiratory phasicity and response to augmentation.  Calf Veins: Near occlusive thrombosis in one of the left posterior tibial veins.  Superficial Great Saphenous Vein: No evidence of thrombus. Normal compressibility and flow on color Doppler imaging.  Venous Reflux:  None.  Other Findings:  None.  IMPRESSION: Near occlusive thrombus in one of the left posterior tibial veins. No other evidence of deep venous thrombosis.   Electronically Signed   By: Beckie Salts M.D.   On: 02/28/2015 19:50      EKG:     Other:   Assessment and  Plan: This is a middle age african male comes in with increase shortness of breath and cough. Thus far better with emperic antibiotics  nebs and steroids. His TB is appropriately treated, ID following. There is signs of improvement in the infiltrates and consolidations. There is multiple cavitations ? From TB vs bacterial super infection (klebsiella, staph etc). I also suspect copd as part of the differential for this flare. He seem to improve with steroids and +/- antibiotics. ? Underlying ILD.   He is overweight and has obstructive sleep apnea, on cpap, voices he wears it nightly with oxygen    RECOMMEDATIONS: -Continue to encourage weight loss -continue present TB regimen -sputum c/s if possible -consider prolong low dose prednisone (pred  qd x 30 days, one refill). This may help is  scarring as well -agree with present copd regimen -attempt to wean fio2 -appreciate ID input -following    I have personally obtained a history, examined the patient, evaluated laboratory and imaging results, formulated the assessment and plan and placed orders.  The Patient requires high complexity decision making for assessment and support, frequent evaluation and titration of therapies, application of advanced monitoring technologies and extensive interpretation of multiple databases.   Herbon Fleming,M.D. Pulmonary & Critical care Medicine [facility]

## 2015-03-01 NOTE — Progress Notes (Signed)
Pt is alert and oriented. Pt denies pain. Pt has been resting comfortably throughout the shift. No other signs of distress noted. Will continue to monitor.

## 2015-03-01 NOTE — Progress Notes (Signed)
Due to patient's history of TB, infection control with the hospital was called. Per Ronny BaconSarah Wall, patient does not need to be on airborne precautions. Patient has had at least 2 negative smears from the health department.

## 2015-03-01 NOTE — Evaluation (Signed)
Physical Therapy Evaluation Patient Details Name: Jonathan PaceKelvin D Camberos Sr. MRN: [id number] DOB: 10/29/1960 Today's Date: 03/01/2015   History of Present Illness  presented to ER with worsening dyspnea; admitted with recurrent PNA and COPD exacerbation.  Of note, patient with multiple previous hospitalization due to lung disease/respiratory distress; diagnosed with TB in March, 2016.  Currently under treatment per health department and with two negative AFBs (no isolation required)  Clinical Impression  Upon evaluation, patient alert and oriented, follows all commands and demonstrates good insight/safety awareness.  Strength and ROM grossly WFL and symmetrical; no pain complaints.  Able to complete sit/stand, basic transfers and gait (40') without assist device, mod indep/indep.  No balance losses or safety concerns, but significant cardiopulmonary deficits noted.  Desat to 80% on 5L with 40' of gait, BORG 6/10, requiring seated rest and pursed lip breathing for recovery. Unable to tolerate additional activity at this time. Would benefit from skilled PT while hospitalized to address above deficits and promote optimal return to PLOF; anticipate no need for skilled PT upon discharge, okay to return home with wife.  Would benefit from outpatient pulmonary rehab referral.  Care management informed/aware.    Follow Up Recommendations No PT follow up;Other (comment) (no follow up PT required upon discharge; may benefit from outpatient pulmonary rehab referral)    Equipment Recommendations       Recommendations for Other Services       Precautions / Restrictions Precautions Precautions: Fall Restrictions Weight Bearing Restrictions: No      Mobility  Bed Mobility               General bed mobility comments: patient seated in chair beginning/end of session  Transfers Overall transfer level: Independent               General transfer comment: sit/stand from recliner without assist  device, no safety concerns  Ambulation/Gait Ambulation/Gait assistance: Modified independent (Device/Increase time) Ambulation Distance (Feet): 40 Feet         General Gait Details: reciprocal stepping, excessive lateral weight shift; no balance losses or safety concerns. Desat to 81% on 5L with minimal activity, BORG 6/10 requiring at least 2 min seated rest for recovery.  Stairs            Wheelchair Mobility    Modified Rankin (Stroke Patients Only)       Balance Overall balance assessment: Independent                                           Pertinent Vitals/Pain Pain Assessment: No/denies pain    Home Living Family/patient expects to be discharged to:: Private residence Living Arrangements: Spouse/significant other Available Help at Discharge: Family Type of Home: House Home Access: Level entry     Home Layout: One level        Prior Function Level of Independence: Independent         Comments: 5L home O2 since Feb 2016     Hand Dominance   Dominant Hand: Right    Extremity/Trunk Assessment   Upper Extremity Assessment: Overall WFL for tasks assessed           Lower Extremity Assessment: Overall WFL for tasks assessed         Communication   Communication: No difficulties  Cognition Arousal/Alertness: Awake/alert Behavior During Therapy: WFL for tasks assessed/performed Overall Cognitive Status: Within Functional  Limits for tasks assessed                      General Comments      Exercises        Assessment/Plan    PT Assessment Patient needs continued PT services  PT Diagnosis Difficulty walking   PT Problem List Decreased activity tolerance;Cardiopulmonary status limiting activity  PT Treatment Interventions     PT Goals (Current goals can be found in the Care Plan section) Acute Rehab PT Goals Patient Stated Goal: to be able to breathe better PT Goal Formulation: With patient Time For  Goal Achievement: 03/16/15 Potential to Achieve Goals: Good Additional Goals Additional Goal #1: Gait >150' with LRAD, mod indep, for household mobilization upon discharge. Additional Goal #2: Indep with activity pacing and energy conservation techniques to maximize oxygenation with functional activities.    Frequency Min 2X/week   Barriers to discharge        Co-evaluation               End of Session   Activity Tolerance: Patient tolerated treatment well Patient left: in chair Nurse Communication: Mobility status (O2 response to activity)         Time: [id number] PT Time Calculation (min) (ACUTE ONLY): 12 min   Charges:   PT Evaluation $Initial PT Evaluation Tier I: 1 Procedure     PT G Codes:       Verdean Murin H. Manson PasseyBrown, PT, DPT 03/01/2015, 2:45 PM [phone]

## 2015-03-02 LAB — PROTIME-INR
INR: 1.15
Prothrombin Time: 14.9 seconds (ref 11.4–15.0)

## 2015-03-02 LAB — BLOOD GAS, VENOUS
Acid-Base Excess: 8.7 mmol/L — ABNORMAL HIGH (ref 0.0–3.0)
BICARBONATE: 37.2 meq/L — AB (ref 21.0–28.0)
PH VEN: 7.34 (ref 7.320–7.430)
Patient temperature: 37
pCO2, Ven: 69 mmHg — ABNORMAL HIGH (ref 44.0–60.0)

## 2015-03-02 MED ORDER — WARFARIN SODIUM 7.5 MG PO TABS
12.0000 mg | ORAL_TABLET | Freq: Every day | ORAL | Status: DC
  Administered 2015-03-02: 12 mg via ORAL
  Filled 2015-03-02: qty 1

## 2015-03-02 NOTE — Progress Notes (Signed)
ANTICOAGULATION CONSULT NOTE - Follow Up Consult  Pharmacy Consult for warfarin Indication: atrial fibrillation  No Known Allergies  Patient Measurements: Height: 5\' 11"  (180.3 cm) Weight: (!) 331 lb 12.8 oz (150.503 kg) IBW/kg (Calculated) : 75.3   Vital Signs: Temp: 97.7 F (36.5 C) (05/11 0430) Temp Source: Oral (05/11 0430) BP: 106/63 mmHg (05/11 0430) Pulse Rate: 93 (05/11 0430)  Labs:  Recent Labs  02/28/15 1348 02/28/15 1838 03/01/15 0441 03/02/15 0447  HGB 11.9*  --  11.4*  --   HCT 38.2*  --  36.3*  --   PLT 47* 183 177  --   LABPROT 13.9  --  15.8* 14.9  INR 1.05  --  1.24 1.15  CREATININE 0.82  --  0.78  --   TROPONINI <0.03  --   --   --     Estimated Creatinine Clearance: 155.5 mL/min (by C-G formula based on Cr of 0.78).   Medications:  Scheduled:  . allopurinol  300 mg Oral Daily  . amoxicillin-clavulanate  1 tablet Oral BID  . colchicine  0.6 mg Oral Daily  . digoxin  0.25 mg Oral Daily  . diltiazem  300 mg Oral Daily  . furosemide  40 mg Oral Daily  . ipratropium-albuterol  3 mL Nebulization Q4H  . isoniazid  300 mg Oral Daily  . losartan  50 mg Oral Daily  . methylPREDNISolone (SOLU-MEDROL) injection  60 mg Intravenous 3 times per day  . metoprolol  50 mg Oral BID  . mometasone-formoterol  2 puff Inhalation BID  . pravastatin  20 mg Oral q1800  . pyrazinamide  2,000 mg Oral Daily  . pyridOXINE  25 mg Oral Daily  . rifampin  600 mg Oral Daily  . warfarin  12 mg Oral Daily    Assessment: Patient is a 54 yo male admitted for shortness of breath.  Patient receiving treatment for TB.  Patient was also recently started on Augmentin therapy as an outpatient.  Patient takes warfarin chronically for atrial fibrillation.  Home dosing of warfarin is 8 mg Monday-Friday and 10 mg on Saturday and Sunday.  INR History: 5/9 - 1.05, per patient took 8 mg dose on this day 5/10 - 1.24, warfarin 10 mg given 5/11 - 1.15  Goal of Therapy:  INR 2-3    Plan:  INR subtherapeutic and trending down. Will increase warfarin dose to 12 mg daily as rifampin can decrease warfarin's effects on the INR warranting higher doses.  Will recheck INR in AM.  Clarisa Schoolsrystal Jermell Holeman, PharmD Clinical Pharmacist 03/02/2015

## 2015-03-02 NOTE — Care Management (Signed)
Provided patient with information on [facility] program

## 2015-03-02 NOTE — Progress Notes (Signed)
[facility] - [place] at [facility]   PATIENT NAME: Jonathan [name]    MR#:  [id number]  DATE OF BIRTH:  [birth year]  SUBJECTIVE:  CHIEF COMPLAINT:   Chief Complaint  Patient presents with  . Shortness of Breath   patient still feels dyspneic. Heart rate elevated in the 120 range. Continues to wheeze. Not feeling any better.  REVIEW OF SYSTEMS:  Review of Systems  Constitutional: Negative for fever and chills.  Respiratory: Positive for shortness of breath and wheezing. Negative for cough.   Cardiovascular: Positive for palpitations and orthopnea. Negative for chest pain.  Gastrointestinal: Negative for nausea, vomiting, abdominal pain, diarrhea and constipation.  Genitourinary: Negative for dysuria.  Neurological: Negative for dizziness, seizures and headaches.    DRUG ALLERGIES:  No Known Allergies  VITALS:  Blood pressure 104/59, pulse 60, temperature 98.2 F (36.8 C), temperature source Oral, resp. rate 18, height  (1.803 m), weight 150.503 kg (331 lb 12.8 oz), SpO2 98 %.  PHYSICAL EXAMINATION:  Physical Exam  GENERAL:  54 y.o.-year-old patient sitting in bed mildly dyspneic.  EYES: Pupils equal, round, reactive to light and accommodation. No scleral icterus. Extraocular muscles intact.  HEENT: Head atraumatic, normocephalic. Oropharynx and nasopharynx clear.  NECK:  Supple, no jugular venous distention. No thyroid enlargement, no tenderness.  LUNGS: Diffuse expiratory wheezes throughout both lungs. No crackles. Minimal use of accessory muscles for breathing on exertion CARDIOVASCULAR: S1, S2 rapid rate and normal. No murmurs, rubs, or gallops.  ABDOMEN: Soft, nontender, nondistended. Bowel sounds present. No organomegaly or mass.  EXTREMITIES: No pedal edema, cyanosis, or clubbing.  NEUROLOGIC: Cranial nerves II through XII are intact. Muscle strength 5/5 in all extremities. Sensation intact. Gait not checked.  PSYCHIATRIC: The patient is  alert and oriented x 3.  SKIN: No obvious rash, lesion, or ulcer.    LABORATORY PANEL:   CBC  Recent Labs Lab 03/01/15 0441  WBC 6.0  HGB 11.4*  HCT 36.3*  PLT 177   ------------------------------------------------------------------------------------------------------------------  Chemistries   Recent Labs Lab 02/28/15 1348 03/01/15 0441  NA 141 139  K 3.9 4.0  CL 100* 100*  CO2 32 32  GLUCOSE 95 140*  BUN 6 8  CREATININE 0.82 0.78  CALCIUM 8.4* 8.1*  AST 27  --   ALT 18  --   ALKPHOS 77  --   BILITOT 0.9  --    ------------------------------------------------------------------------------------------------------------------  Cardiac Enzymes  Recent Labs Lab 02/28/15 1348  TROPONINI <0.03   ------------------------------------------------------------------------------------------------------------------  RADIOLOGY:  Dg Chest 2 View  02/28/2015   CLINICAL DATA:  Shortness of breath  EXAM: CHEST  2 VIEW  COMPARISON:  02/07/2015  FINDINGS: Patchy perihilar airspace opacities are reidentified, minimally improved since previously. Trace pleural effusions are noted. Right IJ central line has been removed. No pneumothorax. No acute osseous finding.  IMPRESSION: Minimal interval improvement in multi lobar airspace consolidation most compatible with previously seen pneumonia.   Electronically Signed   By: Christiana Pellant M.D.   On: 02/28/2015 14:40   Ct Angio Chest Pe W/cm &/or Wo Cm  02/28/2015   CLINICAL DATA:  Increasing shortness of breath with exertion for the past week.  EXAM: CT ANGIOGRAPHY CHEST WITH CONTRAST  TECHNIQUE: Multidetector CT imaging of the chest was performed using the standard protocol during bolus administration of intravenous contrast. Multiplanar CT image reconstructions and MIPs were obtained to evaluate the vascular anatomy.  CONTRAST:  OMNIPAQUE IOHEXOL 350 MG/ML SOLN  COMPARISON:  Radiographs obtained  earlier today and chest CT dated  01/05/2015.  FINDINGS: Suboptimally opacified a pulmonary arteries. There is also limitation due to photon starvation as a result of the large size of the patient. No pulmonary arterial filling defects are seen. Small left pleural effusion with a mild decrease in amount.  Interval multiple areas of cavitation within confluent opacity in the right upper lobe and left upper lobe. This is also involving the right lower lobe. The areas of consolidation are smaller and less component than on the previous examination. A previously demonstrated larger air cavitation in the lingula is significantly smaller. This previously measures 6.7 cm in maximum diameter and currently measures 3.5 cm in maximum diameter.  Patchy areas of interstitial prominence in both lungs with mild progression. The azygos vein remains enlarged at the level of the distal trachea. An enlarged subcarinal node is slightly more prominent, currently with a short axis diameter of 19 mm on image number 67 and previously with a short axis diameter of 17 mm.  A moderate-sized hiatal hernia is again demonstrated. Small pericardial effusion with a maximum thickness of 1.7 cm. The included upper abdomen is unremarkable. Thoracic spine degenerative changes.  Review of the MIP images confirms the above findings.  IMPRESSION: 1. Suboptimal visualization of the pulmonary arteries with no visible pulmonary emboli. 2. Bilateral cavitary pneumonia with an overall increase in cavitation and decrease in consolidation. Differential considerations include Klebsiella, Staphylococcus and fungal infections. Tuberculosis also needs to be excluded. 3. Small left pleural effusion, mildly improved. 4. Mild probable reactive subcarinal adenopathy with mild progression. 5. Small pericardial effusion. 6. Moderate-sized hiatal hernia.   Electronically Signed   By: Beckie SaltsSteven  Reid M.D.   On: 02/28/2015 20:16   [place] Extrem Low Bilat Comp  02/28/2015   CLINICAL DATA:  Dyspnea for the past 3  months.  EXAM: BILATERAL LOWER EXTREMITY VENOUS DOPPLER ULTRASOUND  TECHNIQUE: Gray-scale sonography with graded compression, as well as color Doppler and duplex ultrasound were performed to evaluate the lower extremity deep venous systems from the level of the common femoral vein and including the common femoral, femoral, profunda femoral, popliteal and calf veins including the posterior tibial, peroneal and gastrocnemius veins when visible. The superficial great saphenous vein was also interrogated. Spectral Doppler was utilized to evaluate flow at rest and with distal augmentation maneuvers in the common femoral, femoral and popliteal veins.  COMPARISON:  None.  FINDINGS: RIGHT LOWER EXTREMITY  Common Femoral Vein: No evidence of thrombus. Normal compressibility, respiratory phasicity and response to augmentation.  Saphenofemoral Junction: No evidence of thrombus. Normal compressibility and flow on color Doppler imaging.  Profunda Femoral Vein: No evidence of thrombus. Normal compressibility and flow on color Doppler imaging.  Femoral Vein: No evidence of thrombus. Normal compressibility, respiratory phasicity and response to augmentation.  Popliteal Vein: No evidence of thrombus. Normal compressibility, respiratory phasicity and response to augmentation.  Calf Veins: No evidence of thrombus. Normal compressibility and flow on color Doppler imaging.  Superficial Great Saphenous Vein: No evidence of thrombus. Normal compressibility and flow on color Doppler imaging.  Venous Reflux:  None.  Other Findings:  None.  LEFT LOWER EXTREMITY  Common Femoral Vein: No evidence of thrombus. Normal compressibility, respiratory phasicity and response to augmentation.  Saphenofemoral Junction: No evidence of thrombus. Normal compressibility and flow on color Doppler imaging.  Profunda Femoral Vein: No evidence of thrombus. Normal compressibility and flow on color Doppler imaging.  Femoral Vein: No evidence of thrombus. Normal  compressibility, respiratory phasicity and response  to augmentation.  Popliteal Vein: No evidence of thrombus. Normal compressibility, respiratory phasicity and response to augmentation.  Calf Veins: Near occlusive thrombosis in one of the left posterior tibial veins.  Superficial Great Saphenous Vein: No evidence of thrombus. Normal compressibility and flow on color Doppler imaging.  Venous Reflux:  None.  Other Findings:  None.  IMPRESSION: Near occlusive thrombus in one of the left posterior tibial veins. No other evidence of deep venous thrombosis.   Electronically Signed   By: Beckie SaltsSteven  Reid M.D.   On: 02/28/2015 19:50    EKG:   Orders placed or performed during the hospital encounter of 02/28/15  . ED EKG  . ED EKG    ASSESSMENT AND PLAN:   Marta AntuKelvin Eggenberger is a obese 54 y.o. male with a known history of A. fib on Coumadin, hypertension, COPD on 5 L home oxygen, sleep apnea on CPAP, being treated for tuberculosis by health department, recent admission to the hospital 2 weeks ago for pneumonia comes again secondary to worsening dyspnea.  #1 Acute dyspnea- COPD exacerbation and recurrent pneumonia. CT chest with cavitary lesions- likely from his tuberculosis and no superinfection per ID. Patient will finish off his 7 days of augmentin by today- so discontinue after that. Appreciate ID and pulmonary consults. cont IV steroids and duonebs. Continue CPAP at bedtime. Consider repeat bronch if needed.  CT of the chest with IV contrast showed no PE and doppler lower extremities showed left post tibial vein DVT- pt already on coumadin.  Echo was done recently - need to verify results.  #2 Pulmonary tuberculosis- diagnosed about 2-3 months ago. on rifampin and INH and pyrazinamide at this time, followed by health department. Per infectious disease specialist no need for any airborne isolation at this time as patient is being treated.  #3 obstructive sleep apnea-continue CPAP at night.  #4 atrial  fibrillation-  rate elevated while on digoxin, diltiazem and metoprolol. Continue Coumadin. INR is sub-therapeutic due to rifampin. Other anticoagulants also have the same issue- so coumadin is better as it can be monitored., monitor daily. Pharmacy has been consulted to assist with coumadin management. Will consult cardiology today  #5 Acute thrombocytopenia-error on admission. Repeat numbers are normal.   All the records are reviewed and case discussed with Care Management/Social Workerr. Management plans discussed with the patient, family and they are in agreement.  CODE STATUS: Full code  TOTAL TIME TAKING CARE OF THIS PATIENT: 37 minutes.   POSSIBLE D/C IN 2-3 DAYS, DEPENDING ON CLINICAL CONDITION.   Enid BaasKALISETTI,Latarsha Zani M.D on 03/02/2015 at 1:07 PM  Between 7am to 6pm - Pager - [phone]  After 6pm go to [email or web address] - password [email or web address] [email or web address]  [facility]  Office  [phone]  CC: Primary care physician; Marisue IvanLINTHAVONG, KANHKA, MD

## 2015-03-02 NOTE — Progress Notes (Signed)
Alert and oriented. No complaints of pain. Sinus tach with frequent PVC's on tele. On 5L of oxygen, chronic. Bipap used while sleeping. Infectious disease rounded on patient today due to history of TB. Pulmonary consult today. Patient is continuing to experience shortness of breath with exertion. No further complaints.  Will continue to monitor.

## 2015-03-02 NOTE — Consult Note (Signed)
[facility] Cardiology  CARDIOLOGY CONSULT NOTE  Patient ID: Jonathan Pace Sr. MRN: [id number] DOB/AGE: [birth year] 56 y.o.  Admit date: 02/28/2015 Referring Physician Nemiah Commander Primary Physician [facility] Primary Cardiologist [place] Reason for Consultation paroxysmal atrial fibrillation  HPI: The patient is a 54 year old gentleman with known history of paroxysmal H fibrillation, hypertension, hyperlipidemia and sleep apnea. The patient has a one-week history of progressive shortness of breath, productive cough with fever and chills. Patient has undergone a course of outpatient antibiotics without resolution. Of note, the patient was diagnosed with pulmonary tuberculosis 2-3 months ago and is currently is taking  rifampin and INH. Patient presented to [facility] emergency room where chest CT does reveal cavitary lesions system with tuberculosis with probable overlying COPD exacerbation and recurrent pneumonia. The patient's been treated with intravenous steroids, inhaler therapy overall clinical improvement. Patient has been in sinus rhythm with alternating paroxysmal in nature fibrillation. The patient currently denies chest pain. He does have palpitations which is improved after recent dose of diltiazem.  Review of systems complete and found to be negative unless listed above     Past Medical History  Diagnosis Date  . Atrial fibrillation   . Hypertension   . Tuberculosis   . Sleep apnea   . COPD (chronic obstructive pulmonary disease)     on 5l home oxygen  . Obesity   . Gout   . Hyperlipidemia     Past Surgical History  Procedure Laterality Date  . Bronchoscopy      Prescriptions prior to admission  Medication Sig Dispense Refill Last Dose  . albuterol (PROVENTIL HFA;VENTOLIN HFA) 108 (90 BASE) MCG/ACT inhaler Inhale 2 puffs into the lungs every 6 (six) hours as needed for wheezing or shortness of breath.   PRN at PRN  . allopurinol (ZYLOPRIM) 300 MG tablet Take 300 mg by mouth  daily.   02/28/2015 at Unknown time  . [EXPIRED] amoxicillin-clavulanate (AUGMENTIN) 875-125 MG per tablet Take 1 tablet by mouth 2 (two) times daily.   02/28/2015 at Unknown time  . colchicine 0.6 MG tablet Take 0.6 mg by mouth daily.   02/28/2015 at Unknown time  . digoxin (LANOXIN) 0.25 MG tablet Take 0.25 mg by mouth daily.   02/28/2015 at Unknown time  . diltiazem (CARDIZEM CD) 300 MG 24 hr capsule Take 300 mg by mouth daily.   02/28/2015 at Unknown time  . ethambutol (MYAMBUTOL) 400 MG tablet Take 1,600 mg by mouth daily.   02/28/2015 at Unknown time  . Fluticasone Furoate-Vilanterol 100-25 MCG/INH AEPB Inhale 1 puff into the lungs daily.    02/28/2015 at Unknown time  . furosemide (LASIX) 40 MG tablet Take 40 mg by mouth daily.   02/28/2015 at Unknown time  . HYDROcodone-homatropine (HYCODAN) 5-1.5 MG/5ML syrup Take 5 mLs by mouth every 6 (six) hours as needed for cough.   PRN at PRN  . isoniazid (NYDRAZID) 300 MG tablet Take 300 mg by mouth daily.   02/28/2015 at Unknown time  . losartan (COZAAR) 50 MG tablet Take 50 mg by mouth daily.   02/28/2015 at Unknown time  . lovastatin (MEVACOR) 20 MG tablet Take 20 mg by mouth daily.   02/28/2015 at Unknown time  . metoprolol (LOPRESSOR) 50 MG tablet Take 50 mg by mouth 2 (two) times daily.   02/28/2015 at 0830  . pyrazinamide 500 MG tablet Take 2,000 mg by mouth daily.   02/28/2015 at Unknown time  . rifampin (RIFADIN) 300 MG capsule Take 600 mg by mouth daily.  02/28/2015 at Unknown time  . vitamin B-6 (PYRIDOXINE) 25 MG tablet Take 25 mg by mouth daily.   02/28/2015 at Unknown time  . warfarin (COUMADIN) 10 MG tablet Take 10 mg by mouth daily. Pt takes 10 mg on Saturday and Sunday.   02/27/2015 at Unknown time  . warfarin (COUMADIN) 4 MG tablet Take 8 mg by mouth daily. Pt takes 8 mg on Monday-Friday.   02/28/2015 at Unknown time   History   Social History  . Marital Status: Married    Spouse Name: N/A  . Number of Children: N/A  . Years of Education: N/A    Occupational History  . Not on file.   Social History Main Topics  . Smoking status: Never Smoker   . Smokeless tobacco: Not on file  . Alcohol Use: Yes  . Drug Use: No  . Sexual Activity: Not on file   Other Topics Concern  . Not on file   Social History Narrative  . No narrative on file    Family History  Problem Relation Age of Onset  . Cancer Mother   . Emphysema Father       Review of systems complete and found to be negative unless listed above      PHYSICAL EXAM  General: Well developed, well nourished, in no acute distress HEENT:  Normocephalic and atramatic Neck:  No JVD.  Lungs: Decreased breath sounds bilaterally Heart: Tachycardia, with irregularly irregular rhythm. Normal S1 and S2 without gallops or murmurs.  Abdomen: Bowel sounds are positive, abdomen soft and non-tender  Msk:  Back normal, normal gait. Normal strength and tone for age. Extremities: No clubbing, cyanosis or edema.   Neuro: Alert and oriented X 3. Psych:  Good affect, responds appropriately  Labs:   Lab Results  Component Value Date   WBC 6.0 03/01/2015   HGB 11.4* 03/01/2015   HCT 36.3* 03/01/2015   MCV 84.2 03/01/2015   PLT 177 03/01/2015    Recent Labs Lab 02/28/15 1348 03/01/15 0441  NA 141 139  K 3.9 4.0  CL 100* 100*  CO2 32 32  BUN 6 8  CREATININE 0.82 0.78  CALCIUM 8.4* 8.1*  PROT 7.2  --   BILITOT 0.9  --   ALKPHOS 77  --   ALT 18  --   AST 27  --   GLUCOSE 95 140*   Lab Results  Component Value Date   TROPONINI <0.03 02/28/2015   No results found for: CHOL No results found for: HDL No results found for: LDLCALC No results found for: TRIG No results found for: CHOLHDL No results found for: LDLDIRECT    Radiology: Dg Chest 2 View  02/28/2015   CLINICAL DATA:  Shortness of breath  EXAM: CHEST  2 VIEW  COMPARISON:  02/07/2015  FINDINGS: Patchy perihilar airspace opacities are reidentified, minimally improved since previously. Trace pleural  effusions are noted. Right IJ central line has been removed. No pneumothorax. No acute osseous finding.  IMPRESSION: Minimal interval improvement in multi lobar airspace consolidation most compatible with previously seen pneumonia.   Electronically Signed   By: Christiana Pellant M.D.   On: 02/28/2015 14:40   Ct Angio Chest Pe W/cm &/or Wo Cm  02/28/2015   CLINICAL DATA:  Increasing shortness of breath with exertion for the past week.  EXAM: CT ANGIOGRAPHY CHEST WITH CONTRAST  TECHNIQUE: Multidetector CT imaging of the chest was performed using the standard protocol during bolus administration of intravenous contrast. Multiplanar CT  image reconstructions and MIPs were obtained to evaluate the vascular anatomy.  CONTRAST:  100mL OMNIPAQUE IOHEXOL 350 MG/ML SOLN  COMPARISON:  Radiographs obtained earlier today and chest CT dated 01/05/2015.  FINDINGS: Suboptimally opacified a pulmonary arteries. There is also limitation due to photon starvation as a result of the large size of the patient. No pulmonary arterial filling defects are seen. Small left pleural effusion with a mild decrease in amount.  Interval multiple areas of cavitation within confluent opacity in the right upper lobe and left upper lobe. This is also involving the right lower lobe. The areas of consolidation are smaller and less component than on the previous examination. A previously demonstrated larger air cavitation in the lingula is significantly smaller. This previously measures 6.7 cm in maximum diameter and currently measures 3.5 cm in maximum diameter.  Patchy areas of interstitial prominence in both lungs with mild progression. The azygos vein remains enlarged at the level of the distal trachea. An enlarged subcarinal node is slightly more prominent, currently with a short axis diameter of 19 mm on image number 67 and previously with a short axis diameter of 17 mm.  A moderate-sized hiatal hernia is again demonstrated. Small pericardial effusion  with a maximum thickness of 1.7 cm. The included upper abdomen is unremarkable. Thoracic spine degenerative changes.  Review of the MIP images confirms the above findings.  IMPRESSION: 1. Suboptimal visualization of the pulmonary arteries with no visible pulmonary emboli. 2. Bilateral cavitary pneumonia with an overall increase in cavitation and decrease in consolidation. Differential considerations include Klebsiella, Staphylococcus and fungal infections. Tuberculosis also needs to be excluded. 3. Small left pleural effusion, mildly improved. 4. Mild probable reactive subcarinal adenopathy with mild progression. 5. Small pericardial effusion. 6. Moderate-sized hiatal hernia.   Electronically Signed   By: Beckie SaltsSteven  Reid M.D.   On: 02/28/2015 20:16   [place] Extrem Low Bilat Comp  02/28/2015   CLINICAL DATA:  Dyspnea for the past 3 months.  EXAM: BILATERAL LOWER EXTREMITY VENOUS DOPPLER ULTRASOUND  TECHNIQUE: Gray-scale sonography with graded compression, as well as color Doppler and duplex ultrasound were performed to evaluate the lower extremity deep venous systems from the level of the common femoral vein and including the common femoral, femoral, profunda femoral, popliteal and calf veins including the posterior tibial, peroneal and gastrocnemius veins when visible. The superficial great saphenous vein was also interrogated. Spectral Doppler was utilized to evaluate flow at rest and with distal augmentation maneuvers in the common femoral, femoral and popliteal veins.  COMPARISON:  None.  FINDINGS: RIGHT LOWER EXTREMITY  Common Femoral Vein: No evidence of thrombus. Normal compressibility, respiratory phasicity and response to augmentation.  Saphenofemoral Junction: No evidence of thrombus. Normal compressibility and flow on color Doppler imaging.  Profunda Femoral Vein: No evidence of thrombus. Normal compressibility and flow on color Doppler imaging.  Femoral Vein: No evidence of thrombus. Normal compressibility,  respiratory phasicity and response to augmentation.  Popliteal Vein: No evidence of thrombus. Normal compressibility, respiratory phasicity and response to augmentation.  Calf Veins: No evidence of thrombus. Normal compressibility and flow on color Doppler imaging.  Superficial Great Saphenous Vein: No evidence of thrombus. Normal compressibility and flow on color Doppler imaging.  Venous Reflux:  None.  Other Findings:  None.  LEFT LOWER EXTREMITY  Common Femoral Vein: No evidence of thrombus. Normal compressibility, respiratory phasicity and response to augmentation.  Saphenofemoral Junction: No evidence of thrombus. Normal compressibility and flow on color Doppler imaging.  Profunda Femoral Vein: No  evidence of thrombus. Normal compressibility and flow on color Doppler imaging.  Femoral Vein: No evidence of thrombus. Normal compressibility, respiratory phasicity and response to augmentation.  Popliteal Vein: No evidence of thrombus. Normal compressibility, respiratory phasicity and response to augmentation.  Calf Veins: Near occlusive thrombosis in one of the left posterior tibial veins.  Superficial Great Saphenous Vein: No evidence of thrombus. Normal compressibility and flow on color Doppler imaging.  Venous Reflux:  None.  Other Findings:  None.  IMPRESSION: Near occlusive thrombus in one of the left posterior tibial veins. No other evidence of deep venous thrombosis.   Electronically Signed   By: Beckie SaltsSteven  Reid M.D.   On: 02/28/2015 19:50    EKG: Sinus tachycardia with paroxysmal atrial fibrillation with a rapid ventricular rate  ASSESSMENT AND PLAN:  54 year old gentleman with known history of paroxysmal atrial fibrillation who presents with one-week history of shortness of breath, productive cough, fever and chills consistent with COPD exacerbation, pneumonia with recent diagnosis of pulmonary tuberculosis. Patient is in sinus tachycardia alternating atrial fibrillation with a rapid ventricular rate,  relatively asymptomatic and hemodynamically stable. Patient on Coumadin for stroke prevention, INR subtherapeutic at 1.15. Patient's on multiple medications for rate and rhythm control including digoxin, metoprolol tartrate and diltiazem.  REC  #1 agree with overall current therapy #2 adjust Coumadin dose for target INR of 2.0-3.0 #3 continue digoxin, diltiazem and metoprolol titrate, adjust dosing to maintain heart rate below 110 bpm; overall heart rate will be better controlled once underlying condition is stabilized #4 consider 2-D echocardiogram if not performed during the past 6 months #5 further recommendations pending patient's clinical course, continue to follow  Signed: Naiyana Barbian MD,PhD, [facility] 03/02/2015, 5:12 PM

## 2015-03-03 LAB — CBC
HCT: 38.8 % — ABNORMAL LOW (ref 40.0–52.0)
Hemoglobin: 12.3 g/dL — ABNORMAL LOW (ref 13.0–18.0)
MCH: 26.9 pg (ref 26.0–34.0)
MCHC: 31.7 g/dL — ABNORMAL LOW (ref 32.0–36.0)
MCV: 85 fL (ref 80.0–100.0)
PLATELETS: 196 10*3/uL (ref 150–440)
RBC: 4.57 MIL/uL (ref 4.40–5.90)
RDW: 19.1 % — ABNORMAL HIGH (ref 11.5–14.5)
WBC: 7 10*3/uL (ref 3.8–10.6)

## 2015-03-03 LAB — PROTIME-INR
INR: 1.11
PROTHROMBIN TIME: 14.5 s (ref 11.4–15.0)

## 2015-03-03 LAB — BASIC METABOLIC PANEL
ANION GAP: 7 (ref 5–15)
BUN: 14 mg/dL (ref 6–20)
CALCIUM: 8.3 mg/dL — AB (ref 8.9–10.3)
CO2: 34 mmol/L — ABNORMAL HIGH (ref 22–32)
CREATININE: 0.84 mg/dL (ref 0.61–1.24)
Chloride: 99 mmol/L — ABNORMAL LOW (ref 101–111)
GFR calc Af Amer: >60 mL/min (ref >60)
GFR calc non Af Amer: >60 mL/min (ref >60)
Glucose, Bld: 155 mg/dL — ABNORMAL HIGH (ref 65–99)
POTASSIUM: 4 mmol/L (ref 3.5–5.1)
Sodium: 140 mmol/L (ref 135–145)

## 2015-03-03 MED ORDER — WARFARIN SODIUM 7.5 MG PO TABS
15.0000 mg | ORAL_TABLET | Freq: Every day | ORAL | Status: DC
  Administered 2015-03-03 – 2015-03-04 (×2): 15 mg via ORAL
  Filled 2015-03-03 (×2): qty 2

## 2015-03-03 MED ORDER — DILTIAZEM HCL 25 MG/5ML IV SOLN
10.0000 mg | Freq: Four times a day (QID) | INTRAVENOUS | Status: DC | PRN
  Administered 2015-03-03: 10 mg via INTRAVENOUS
  Filled 2015-03-03 (×2): qty 5

## 2015-03-03 MED ORDER — DILTIAZEM HCL 100 MG IV SOLR
5.0000 mg/h | INTRAVENOUS | Status: DC
  Administered 2015-03-03: 15 mg/h via INTRAVENOUS
  Administered 2015-03-04: 10 mg/h via INTRAVENOUS
  Filled 2015-03-03 (×2): qty 100

## 2015-03-03 MED ORDER — METOPROLOL TARTRATE 50 MG PO TABS
75.0000 mg | ORAL_TABLET | Freq: Two times a day (BID) | ORAL | Status: DC
  Administered 2015-03-04 – 2015-03-05 (×3): 75 mg via ORAL
  Filled 2015-03-03 (×3): qty 1

## 2015-03-03 MED ORDER — METHYLPREDNISOLONE SODIUM SUCC 40 MG IJ SOLR
40.0000 mg | Freq: Two times a day (BID) | INTRAMUSCULAR | Status: DC
  Administered 2015-03-04 – 2015-03-05 (×3): 40 mg via INTRAVENOUS
  Filled 2015-03-03 (×3): qty 1

## 2015-03-03 NOTE — Progress Notes (Signed)
Spoke with Dr Allena KatzPatel regarding pt HR and BP.  Advised to hold metoprolol and entered in new order for pt's HR.  Will continue to monitor.  Cristela FeltHelen Iris Guidry, RN

## 2015-03-03 NOTE — Progress Notes (Signed)
Date: 03/03/2015,   MRN# [id number] Jonathan PaceKelvin D Guimaraes Sr. 08/18/1961 Code Status:     Code Status Orders        Start     Ordered   02/28/15 1821  Full code   Continuous     02/28/15 1820      HPI: This is [age] yr ols male, now in isolation. His sixth sputum culture showed afb positivity per the health department. He is on triple coverage. Presently on cpap sleeping, no distress  PMHX:   Past Medical History  Diagnosis Date  . Atrial fibrillation   . Hypertension   . Tuberculosis   . Sleep apnea   . COPD (chronic obstructive pulmonary disease)     on 5l home oxygen  . Obesity   . Gout   . Hyperlipidemia    Surgical Hx:  Past Surgical History  Procedure Laterality Date  . Bronchoscopy     Family Hx:  Family History  Problem Relation Age of Onset  . Cancer Mother   . Emphysema Father    Social Hx:   History  Substance Use Topics  . Smoking status: Never Smoker   . Smokeless tobacco: Not on file  . Alcohol Use: Yes   Medication:    Home Medication:  No current outpatient prescriptions on file.  Current Medication: @CURMEDTAB @   Allergies:  Review of patient's allergies indicates no known allergies.  Review of Systems:per niurse Gen:  Denies  fever, sweats, chills HEENT: Denies blurred vision, double vision, ear pain, eye pain, hearing loss, nose bleeds, sore throat Cvc:  No dizziness, chest pain or heaviness Resp:  No change, still quite dyspneic  Gi: Denies swallowing difficulty, stomach pain, nausea or vomiting, diarrhea, constipation, bowel incontinence Gu:  Denies bladder incontinence, burning urine Ext:   No Joint pain, stiffness or swelling Skin: No skin rash, easy bruising or bleeding or hives Endoc:  No polyuria, polydipsia , polyphagia or weight change Psych: No depression, insomnia or hallucinations  Other:  All other systems negative  Physical Examination:   VS: BP 119/68 mmHg  Pulse 97  Temp(Src) 98.2 F (36.8 C) (Oral)  Resp 20  Ht 5'  11" (1.803 m)  Wt 338 lb 3.2 oz (153.407 kg)  BMI 47.19 kg/m2  SpO2 96%  General Appearance: No distress  Neuro: without focal findings, mental status, speech normal, alert and oriented, cranial nerves 2-12 intact, reflexes normal and symmetric, sensation grossly normal  HEENT: PERRLA, EOM intact, no ptosis, no other lesions noticed, Mallampati: Pulmonary:Distant, wheezes present  Cardiovascular:  Normal S1,S2.  No m/r/g.  Abdominal aorta pulsation normal.    Abdomen:Benign, Soft, non-tender, No masses, hepatosplenomegaly, No lymphadenopathy Endoc: No evident thyromegaly, no signs of acromegaly or Cushing features Skin:   warm, no rashes, no ecchymosis  Extremities: normal, no cyanosis, clubbing, no edema, warm with normal capillary refill. Other findings:   Labs results:   Recent Labs     03/01/15  0441  03/02/15  0447  03/03/15  0536  HGB  11.4*   --   12.3*  HCT  36.3*   --   38.8*  MCV  84.2   --   85.0  WBC  6.0   --   7.0  BUN  8   --   14  CREATININE  0.78   --   0.84  GLUCOSE  140*   --   155*  CALCIUM  8.1*   --   8.3*  INR  1.24  1.15  1.11  ,    No results for input(s): PH in the last 72 hours.  Invalid input(s): PCO2, PO2, BASEEXCESS, BASEDEFICITE, TFT  Culture results:     Rad results:   No results found.    EKG:     Other:   Assessment and Plan: This is a middle age african male comes in with increase shortness of breath and cough. Thus far was better with emperic antibiotics nebs and steroids. His TB is appropriately treated, ID following. Surprisingly sixth sputum sample shows AFB organisms.  There is multiple cavitations ? From TB and/or bacterial super infection (klebsiella, staph etc). May be difficult to readily sterilize  With the presence of multiple cavitations. I also suspect copd as part of the differential for this flare.  ? Underlying ILD.   He is overweight and has obstructive sleep apnea, on cpap, voices he wears it nightly with oxygen     RECOMMEDATIONS: -Continue to encourage weight loss -continue present TB regimen -sputum c/s if possible -agree with present copd regimen -attempt to wean fio2 -CPAP during sleep -appreciate ID input -following     I have personally obtained a history, examined the patient, evaluated laboratory and imaging results, formulated the assessment and plan.   The Patient requires high complexity decision making for assessment and support, frequent evaluation and titration of therapies, application of advanced monitoring technologies and extensive interpretation of multiple databases.   Demetrius Mahler,M.D. Pulmonary & Critical care Medicine [facility]

## 2015-03-03 NOTE — Progress Notes (Signed)
PT Cancellation Note  Patient Details Name: Jonathan PaceKelvin D Rashid Sr. MRN: [id number] DOB: 12/07/1960   Cancelled Treatment:    Reason Eval/Treat Not Completed: Medical issues which prohibited therapy (patient noted with new-onset aflutter with HR in 130-140s this AM.  Received AM meds, but rate remains elevated.  Will hold skilled PT services at this time until HR controlled and patient medically appropriate for activity.)   Makoto Sellitto H. Manson PasseyBrown, PT, DPT 03/03/2015, 11:34 AM [phone]

## 2015-03-03 NOTE — Progress Notes (Signed)
Notified Dr. Nemiah CommanderKalisetti that patient's heart rate is in the 130's and he had an episode of aflutter. Per MD okay to give cardizem early today. Will notify pharmacy to change doses to 6AM in the future per the patients request.

## 2015-03-03 NOTE — Progress Notes (Signed)
[facility] [place] [facility]   PATIENT NAME: Jonathan [name]    MR#:  [id number]  DATE OF BIRTH:  01/27/1961  SUBJECTIVE:  CHIEF COMPLAINT:   Chief Complaint  Patient presents with  . Shortness of Breath   breathing some better today. Less wheezing today. Continues to have palpitations. Heart rate in the 120s. Rhythm is sinus tachycardia today.  REVIEW OF SYSTEMS:  ROS Constitutional: Negative for fever and chills.  Respiratory: Positive for shortness of breath and wheezing. Negative for cough.  Cardiovascular: Positive for palpitations and orthopnea. Negative for chest pain.  Gastrointestinal: Negative for nausea, vomiting, abdominal pain, diarrhea and constipation.  Genitourinary: Negative for dysuria.  Neurological: Negative for dizziness, seizures and headaches.  DRUG ALLERGIES:  No Known Allergies  VITALS:  Blood pressure 123/83, pulse 138, temperature 98.2 F (36.8 C), temperature source Oral, resp. rate 20, height 5\' 11"  (1.803 m), weight 153.407 kg (338 lb 3.2 oz), SpO2 97 %.  PHYSICAL EXAMINATION:  Physical Exam  GENERAL:  54 y.o.-year-old patient sitting in bed mildly dyspneic.  EYES: Pupils equal, round, reactive to light and accommodation. No scleral icterus. Extraocular muscles intact.  HEENT: Head atraumatic, normocephalic. Oropharynx and nasopharynx clear.  NECK:  Supple, no jugular venous distention. No thyroid enlargement, no tenderness.  LUNGS: Diffuse expiratory wheezes throughout both lungs. Wheezing is better than yesterday. No crackles. Minimal use of accessory muscles for breathing on exertion CARDIOVASCULAR: S1, S2 rapid rate and normal. No murmurs, rubs, or gallops.  ABDOMEN: Soft, nontender, nondistended. Bowel sounds present. No organomegaly or mass.  EXTREMITIES: No pedal edema, cyanosis, or clubbing.  NEUROLOGIC: Cranial nerves II through XII are intact. Muscle strength 5/5 in all extremities. Sensation intact. Gait  not checked.  PSYCHIATRIC: The patient is alert and oriented x 3.  SKIN: No obvious rash, lesion, or ulcer.    LABORATORY PANEL:   CBC  Recent Labs Lab 03/03/15 0536  WBC 7.0  HGB 12.3*  HCT 38.8*  PLT 196   ------------------------------------------------------------------------------------------------------------------  Chemistries   Recent Labs Lab 02/28/15 1348  03/03/15 0536  NA 141  < > 140  K 3.9  < > 4.0  CL 100*  < > 99*  CO2 32  < > 34*  GLUCOSE 95  < > 155*  BUN 6  < > 14  CREATININE 0.82  < > 0.84  CALCIUM 8.4*  < > 8.3*  AST 27  --   --   ALT 18  --   --   ALKPHOS 77  --   --   BILITOT 0.9  --   --   < > = values in this interval not displayed. ------------------------------------------------------------------------------------------------------------------  Cardiac Enzymes  Recent Labs Lab 02/28/15 1348  TROPONINI <0.03   ------------------------------------------------------------------------------------------------------------------  RADIOLOGY:  No results found.  EKG:   Orders placed or performed during the hospital encounter of 02/28/15  . ED EKG  . ED EKG    ASSESSMENT AND PLAN:   Jonathan [name] is a obese 54 y.o. male with a known history of A. fib on Coumadin, hypertension, COPD on 5 L home oxygen, sleep apnea on CPAP, being treated for tuberculosis by health department, recent admission to the hospital 2 weeks ago for pneumonia comes again secondary to worsening dyspnea.  #1 Acute dyspnea- COPD exacerbation and recurrent TB. CT chest with cavitary lesions- likely from his tuberculosis and no superinfection per ID. Patient finished off his Augmentin yesterday. Health department called with the sixth sputum  positive again for Mycobacterium tuberculosis. So patient will be put on airborne isolation. This will be treated as active tuberculosis. Appreciate ID and pulmonary consults. cont IV steroids and duonebs. Continue CPAP at  bedtime. Consider repeat bronch if needed.  CT of the chest with IV contrast showed no PE and doppler lower extremities showed left post tibial vein DVT- pt already on coumadin.  Echo was done recently - need to verify results.  #2 Pulmonary tuberculosis- possible active tuberculosis as his most recent sputum came back positive for AFB. His last 5 sputum samples are negative. diagnosed about 2-3 months ago. on rifampin and INH and pyrazinamide at this time, followed by health department. Patient will be placed on airborne isolation now again.  #3 obstructive sleep apnea-continue CPAP at night.  #4 atrial fibrillation-  rate elevated while on digoxin, diltiazem and metoprolol. Continue Coumadin. INR is sub-therapeutic due to rifampin. Other anticoagulants also have the same issue- so coumadin is better as it can be monitored., monitor daily. Pharmacy has been consulted to assist with coumadin management. Will consult cardiology.  #5 Acute thrombocytopenia-error on admission. Repeat numbers are normal.   All the records are reviewed and case discussed with Care Management/Social Workerr. Management plans discussed with the patient, family and they are in agreement.  CODE STATUS: Full code  TOTAL TIME TAKING CARE OF THIS PATIENT: 37 minutes.   POSSIBLE D/C IN 2-3 DAYS, DEPENDING ON CLINICAL CONDITION.   Enid BaasKALISETTI,Dorrine Montone M.D on 03/03/2015 at 12:33 PM  Between 7am to 6pm - Pager - [phone]  After 6pm go to [email or web address] - password [email or web address] [facility]  [facility]  Office  [phone]  CC: Primary care physician; Marisue IvanLINTHAVONG, KANHKA, MD

## 2015-03-03 NOTE — Progress Notes (Signed)
Pt alertx4. VSS. No complaints of pain. Independent in room. Pt resting in bed. Will continue to monitor.

## 2015-03-03 NOTE — Progress Notes (Signed)
Spoke with Dr Anne HahnWillis regarding pt's continued high heart rate.  MD order for pt to go to ICU.  House supervisor and charge nurse notified. Cristela FeltHelen Iris Guidry, RN

## 2015-03-04 LAB — PROTIME-INR
INR: 1.17
Prothrombin Time: 15.1 seconds — ABNORMAL HIGH (ref 11.4–15.0)

## 2015-03-04 LAB — DIGOXIN LEVEL: Digoxin Level: 0.2 ng/mL — ABNORMAL LOW (ref 0.8–2.0)

## 2015-03-04 MED ORDER — ENOXAPARIN SODIUM 150 MG/ML ~~LOC~~ SOLN
150.0000 mg | Freq: Two times a day (BID) | SUBCUTANEOUS | Status: DC
  Administered 2015-03-05: 150 mg via SUBCUTANEOUS
  Filled 2015-03-04 (×3): qty 1

## 2015-03-04 MED ORDER — WARFARIN SODIUM 1 MG PO TABS
5.0000 mg | ORAL_TABLET | Freq: Once | ORAL | Status: AC
  Administered 2015-03-04: 5 mg via ORAL
  Filled 2015-03-04: qty 5

## 2015-03-04 MED ORDER — ENOXAPARIN SODIUM 150 MG/ML ~~LOC~~ SOLN
1.0000 mg/kg | Freq: Two times a day (BID) | SUBCUTANEOUS | Status: DC
  Filled 2015-03-04 (×3): qty 2

## 2015-03-04 MED ORDER — WARFARIN SODIUM 10 MG PO TABS: 20.0000 mg | ORAL_TABLET | Freq: Every day | ORAL | Status: DC

## 2015-03-04 MED ORDER — ENOXAPARIN SODIUM 150 MG/ML ~~LOC~~ SOLN
1.0000 mg/kg | Freq: Once | SUBCUTANEOUS | Status: AC
  Administered 2015-03-04: 155 mg via SUBCUTANEOUS
  Filled 2015-03-04: qty 2

## 2015-03-04 MED ORDER — WARFARIN SODIUM 7.5 MG PO TABS: 20.0000 mg | ORAL_TABLET | Freq: Every day | ORAL | Status: DC

## 2015-03-04 NOTE — Progress Notes (Signed)
Pt alert and oriented, no complaints of pain. A-fib on telemetry, hr controlled 80-100.  5L [place], tolerating diet, up to bathroom, bm this shift,  VSS, afebrile.  :REmains on airborne precautions.

## 2015-03-04 NOTE — Progress Notes (Signed)
[facility]  SUBJECTIVE: SOB   Filed Vitals:   03/04/15 1020 03/04/15 1021 03/04/15 1100 03/04/15 1200  BP:  120/90 142/74 131/76  Pulse: 106 106 106 78  Temp:      TempSrc:      Resp:   26 29  Height:      Weight:      SpO2:  97% 99% 97%     Intake/Output Summary (Last 24 hours) at 03/04/15 1317 Last data filed at 03/04/15 1200  Gross per 24 hour  Intake  86.75 ml  Output   2500 ml  Net -2413.25 ml      PHYSICAL EXAM  General: Well developed, well nourished, in no acute distress HEENT:  Normocephalic and atramatic Neck:  No JVD.  Lungs: Decreased breath sounds Heart: Irregular irregular. Normal S1 and S2 without gallops or murmurs.  Abdomen: Bowel sounds are positive, abdomen soft and non-tender  Msk:  Back normal, normal gait. Normal strength and tone for age. Extremities: No clubbing, cyanosis or edema.   Neuro: Alert and oriented X 3. Psych:  Good affect, responds appropriately   LABS: Basic Metabolic Panel:  Recent Labs  16/07/9604/12/16 0536  NA 140  K 4.0  CL 99*  CO2 34*  GLUCOSE 155*  BUN 14  CREATININE 0.84  CALCIUM 8.3*   Liver Function Tests: No results for input(s): AST, ALT, ALKPHOS, BILITOT, PROT, ALBUMIN in the last 72 hours. No results for input(s): LIPASE, AMYLASE in the last 72 hours. CBC:  Recent Labs  03/03/15 0536  WBC 7.0  HGB 12.3*  HCT 38.8*  MCV 85.0  PLT 196   Cardiac Enzymes: No results for input(s): CKTOTAL, CKMB, CKMBINDEX, TROPONINI in the last 72 hours. BNP: Invalid input(s): POCBNP D-Dimer: No results for input(s): DDIMER in the last 72 hours. Hemoglobin A1C: No results for input(s): HGBA1C in the last 72 hours. Fasting Lipid Panel: No results for input(s): CHOL, HDL, LDLCALC, TRIG, CHOLHDL, LDLDIRECT in the last 72 hours. Thyroid Function Tests: No results for input(s): TSH, T4TOTAL, T3FREE, THYROIDAB in the last 72 hours.  Invalid input(s): FREET3 Anemia Panel: No results for input(s): VITAMINB12,  FOLATE, FERRITIN, TIBC, IRON, RETICCTPCT in the last 72 hours.  No results found.   Echo   TELEMETRY:  Atrial fibrillation  ASSESSMENT AND PLAN:  Principal Problem:   Dyspnea Active Problems:   A-fib    1. Atrial fibrillation with RVR, on low dose cardizem drip, HR exacerbated by underlying pulmonary condition, on diltiazem, metoprolol and dig as out patient 2.  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  TB, on airborne isolation  REC  1. Convert iv to po cardizem 60-90 mg Q6h, titrate to HR < 100 bpm 2. Continue metoprolol 3. Furosemide prn fluid retention 4. Continue warfarin for strole prevention, target INR 2-3   Joniece Smotherman, MD, PhD, [place] [facility] 03/04/2015 1:17 PM

## 2015-03-04 NOTE — Progress Notes (Signed)
PT Cancellation Note  Patient Details Name: Jonathan PaceKelvin D Crunk Sr. MRN: [id number] DOB: 11/15/1960   Cancelled Treatment:    Reason Eval/Treat Not Completed: Medical issues which prohibited therapy (Patient status post transfer to CCU for change in medical status (elevated HR requiring cardizem drip).  Initial order complete; will require new orders to resume care as patient medically appropriate.)  Kamarri Fischetti H. Manson PasseyBrown, PT, DPT 03/04/2015, 8:33 AM [phone]

## 2015-03-04 NOTE — Progress Notes (Addendum)
ANTICOAGULATION CONSULT NOTE - Follow Up Consult  Pharmacy Consult for warfarin Indication: atrial fibrillation  No Known Allergies  Patient Measurements: Height: 5\' 11"  (180.3 cm) Weight: (!) 338 lb 3.2 oz (153.407 kg) IBW/kg (Calculated) : 75.3   Vital Signs: Temp: 97.7 F (36.5 C) (05/13 0330) Temp Source: Oral (05/13 0330) BP: 120/90 mmHg (05/13 1021) Pulse Rate: 106 (05/13 1021)  Labs:  Recent Labs  03/02/15 0447 03/03/15 0536 03/04/15 0708  HGB  --  12.3*  --   HCT  --  38.8*  --   PLT  --  196  --   LABPROT 14.9 14.5 15.1*  INR 1.15 1.11 1.17  CREATININE  --  0.84  --     Estimated Creatinine Clearance: 149.7 mL/min (by C-G formula based on Cr of 0.84).   Medications:  Scheduled:  . allopurinol  300 mg Oral Daily  . colchicine  0.6 mg Oral Daily  . digoxin  0.25 mg Oral Daily  . diltiazem  300 mg Oral Daily  . furosemide  40 mg Oral Daily  . isoniazid  300 mg Oral Daily  . losartan  50 mg Oral Daily  . methylPREDNISolone (SOLU-MEDROL) injection  40 mg Intravenous Q12H  . metoprolol  75 mg Oral BID  . mometasone-formoterol  2 puff Inhalation BID  . pravastatin  20 mg Oral q1800  . pyrazinamide  2,000 mg Oral Daily  . pyridOXINE  25 mg Oral Daily  . rifampin  600 mg Oral Daily  . warfarin  15 mg Oral Daily    Assessment: Patient is a 54 yo male admitted for shortness of breath.  Patient receiving treatment for TB.  Patient was also recently started on Augmentin therapy as an outpatient.  Patient takes warfarin chronically for atrial fibrillation.  Home dosing of warfarin is 8 mg Monday-Friday and 10 mg on Saturday and Sunday.  INR History: 5/9 - 1.05, per patient took 8 mg dose on this day 5/10 - 1.24, warfarin 10 mg given 5/11 - 1.15, warfarin 12 mg given 5/12 - 1.11, warfarin 15 mg given 5/13 - 1.17, warfarin 15 mg given at 1023  Goal of Therapy:  INR 2-3   Plan:  INR subtherapeutic. Will increase warfarin dose to 20 mg daily as rifampin  can decrease warfarin's effects on the INR warranting higher doses.  Studies evaluating the interaction show that a 50% increase in dosing may be warranted for warfarin when rifampin is added to a regimen. Patient has received the 15 mg dose today at 1023.  Will order a 5 mg oral once to make total daily dose of 20 mg. Will recheck INR in AM.  Clarisa Schoolsrystal Lexi Conaty, PharmD Clinical Pharmacist 03/04/2015

## 2015-03-04 NOTE — Progress Notes (Signed)
ANTICOAGULATION CONSULT NOTE - Initial Consult  Pharmacy Consult for Lovenox Treatment Dose Indication: atrial fibrillation  No Known Allergies  Patient Measurements: Height: 5\' 11"  (180.3 cm) Weight: (!) 338 lb 3.2 oz (153.407 kg) IBW/kg (Calculated) : 75.3  Vital Signs: Temp: 97.7 F (36.5 C) (05/13 0330) Temp Source: Oral (05/13 0330) BP: 131/76 mmHg (05/13 1200) Pulse Rate: 78 (05/13 1200)  Labs:  Recent Labs  03/02/15 0447 03/03/15 0536 03/04/15 0708  HGB  --  12.3*  --   HCT  --  38.8*  --   PLT  --  196  --   LABPROT 14.9 14.5 15.1*  INR 1.15 1.11 1.17  CREATININE  --  0.84  --     Estimated Creatinine Clearance: 149.7 mL/min (by C-G formula based on Cr of 0.84).   Medical History: Past Medical History  Diagnosis Date  . Atrial fibrillation   . Hypertension   . Tuberculosis   . Sleep apnea   . COPD (chronic obstructive pulmonary disease)     on 5l home oxygen  . Obesity   . Gout   . Hyperlipidemia     Medications:  Scheduled:  . allopurinol  300 mg Oral Daily  . colchicine  0.6 mg Oral Daily  . digoxin  0.25 mg Oral Daily  . diltiazem  300 mg Oral Daily  . enoxaparin (LOVENOX) injection  1 mg/kg Subcutaneous Once  . enoxaparin (LOVENOX) injection  1 mg/kg Subcutaneous Q12H  . furosemide  40 mg Oral Daily  . isoniazid  300 mg Oral Daily  . losartan  50 mg Oral Daily  . methylPREDNISolone (SOLU-MEDROL) injection  40 mg Intravenous Q12H  . metoprolol  75 mg Oral BID  . mometasone-formoterol  2 puff Inhalation BID  . pravastatin  20 mg Oral q1800  . pyrazinamide  2,000 mg Oral Daily  . pyridOXINE  25 mg Oral Daily  . rifampin  600 mg Oral Daily    Assessment: Patient is a 54 yo male with atrial fibrillation.  Patient continues to have subtherapeutic INRs while on warfarin due to drug interaction with rifampin.   Goal of Therapy:  Adjust Lovenox dosing based on weight and renal function. Monitor platelets and creatinine.   Plan:   Lovenox 1 mg/kg subq q12h currently ordered.  Continue current dosing based on weight and renal function.  Pharmacy will continue to follow.    [name] G 03/04/2015,1:49 PM

## 2015-03-04 NOTE — Progress Notes (Signed)
ID: Beverely PaceKelvin D Betsch Sr. is a 54 y.o. male with  TB  Principal Problem:   Dyspnea Active Problems:   A-fib  Subjective: Repeat otpt AFB smear + per HD - moved to isolation.  Had A fib with RVR so went to Unit on dilt drip. Currently HR better. Actually feels a little better. Cough is non productive. No fevers, ns.   Medications:  . allopurinol  300 mg Oral Daily  . colchicine  0.6 mg Oral Daily  . digoxin  0.25 mg Oral Daily  . diltiazem  300 mg Oral Daily  . enoxaparin (LOVENOX) injection  1 mg/kg Subcutaneous Q12H  . furosemide  40 mg Oral Daily  . isoniazid  300 mg Oral Daily  . losartan  50 mg Oral Daily  . methylPREDNISolone (SOLU-MEDROL) injection  40 mg Intravenous Q12H  . metoprolol  75 mg Oral BID  . mometasone-formoterol  2 puff Inhalation BID  . pravastatin  20 mg Oral q1800  . pyrazinamide  2,000 mg Oral Daily  . pyridOXINE  25 mg Oral Daily  . rifampin  600 mg Oral Daily    Objective: Vital signs in last 24 hours: Temp:  [97.7 F (36.5 C)-98.6 F (37 C)] 97.7 F (36.5 C) (05/13 0330) Pulse Rate:  [76-148] 76 (05/13 1500) Resp:  [19-42] 23 (05/13 1300) BP: (98-142)/(62-90) 109/62 mmHg (05/13 1500) SpO2:  [94 %-100 %] 96 % (05/13 1500)  GENERAL: morbidly obese patient sitting in the bed  EYES: Pupils equal, round, reactive to light and accommodation. No scleral icterus. Extraocular muscles intact.  HEENT: Head atraumatic, normocephalic. Oropharynx and nasopharynx clear.  NECK: Supple, no jugular venous distention. No thyroid enlargement, no tenderness.  LUNGS:scattered wheezes bilaterally, + rhonchiNo use of accessory muscles of respiration. On 5l o2 CARDIOVASCULAR: S1, S2 normal. No murmurs, rubs, or gallops.  ABDOMEN: Soft, nontender, nondistended. Bowel sounds present. No organomegaly or mass.  EXTREMITIES: 2+ bilateral pedal edema, No cyanosis, or clubbing.  NEUROLOGIC: Cranial nerves II through XII are intact. Muscle strength 5/5 in all  extremities. Sensation intact. Gait not checked.  PSYCHIATRIC: The patient is alert and oriented x 3.  SKIN: No obvious rash, lesion, or ulcer.   Lab Results  Recent Labs  03/03/15 0536  WBC 7.0  HGB 12.3*  HCT 38.8*  NA 140  K 4.0  CL 99*  CO2 34*  BUN 14  CREATININE 0.84   Liver Panel No results for input(s): PROT, ALBUMIN, AST, ALT, ALKPHOS, BILITOT, BILIDIR, IBILI in the last 72 hours. Sedimentation Rate No results for input(s): ESRSEDRATE in the last 72 hours. C-Reactive Protein No results for input(s): CRP in the last 72 hours.  Microbiology: Results for orders placed or performed during the hospital encounter of 02/07/15  Culture, blood (single)     Status: None   Collection Time: 02/08/15 12:20 PM  Result Value Ref Range Status   Micro Text Report   Final       COMMENT                   NO GROWTH AEROBICALLY/ANAEROBICALLY IN 5 DAYS   ANTIBIOTIC                                                      Culture, blood (single)     Status: None  Collection Time: 02/08/15  1:33 PM  Result Value Ref Range Status   Micro Text Report   Final       COMMENT                   NO GROWTH AEROBICALLY/ANAEROBICALLY IN 5 DAYS   ANTIBIOTIC                                                      Culture, expectorated sputum-assessment     Status: None   Collection Time: 02/08/15 11:40 PM  Result Value Ref Range Status   Micro Text Report   Final       COMMENT                   APPEARS TO BE NORMAL FLORA AT 48 HOURS   GRAM STAIN                MODERATE WHITE BLOOD CELLS   GRAM STAIN                MODERATE GRAM NEGATIVE ROD   GRAM STAIN                MODERATE GRAM POSITIVE COCCI IN PAIRS   GRAM STAIN                EXCELLENT SPECIMEN-90-100% WBC   ANTIBIOTIC                                                        Studies/Results: Dg Chest 2 View  02/28/2015   CLINICAL DATA:  Shortness of breath  EXAM: CHEST  2 VIEW  COMPARISON:  02/07/2015  FINDINGS: Patchy perihilar  airspace opacities are reidentified, minimally improved since previously. Trace pleural effusions are noted. Right IJ central line has been removed. No pneumothorax. No acute osseous finding.  IMPRESSION: Minimal interval improvement in multi lobar airspace consolidation most compatible with previously seen pneumonia.   Electronically Signed   By: Christiana PellantGretchen  Green M.D.   On: 02/28/2015 14:40   Ct Angio Chest Pe W/cm &/or Wo Cm  02/28/2015   CLINICAL DATA:  Increasing shortness of breath with exertion for the past week.  EXAM: CT ANGIOGRAPHY CHEST WITH CONTRAST  TECHNIQUE: Multidetector CT imaging of the chest was performed using the standard protocol during bolus administration of intravenous contrast. Multiplanar CT image reconstructions and MIPs were obtained to evaluate the vascular anatomy.  CONTRAST:  100mL OMNIPAQUE IOHEXOL 350 MG/ML SOLN  COMPARISON:  Radiographs obtained earlier today and chest CT dated 01/05/2015.  FINDINGS: Suboptimally opacified a pulmonary arteries. There is also limitation due to photon starvation as a result of the large size of the patient. No pulmonary arterial filling defects are seen. Small left pleural effusion with a mild decrease in amount.  Interval multiple areas of cavitation within confluent opacity in the right upper lobe and left upper lobe. This is also involving the right lower lobe. The areas of consolidation are smaller and less component than on the previous examination. A previously demonstrated larger air cavitation in the lingula is significantly smaller. This previously measures 6.7 cm in maximum diameter and  currently measures 3.5 cm in maximum diameter.  Patchy areas of interstitial prominence in both lungs with mild progression. The azygos vein remains enlarged at the level of the distal trachea. An enlarged subcarinal node is slightly more prominent, currently with a short axis diameter of 19 mm on image number [id number] and previously with a short axis diameter of  17 mm.  A moderate-sized hiatal hernia is again demonstrated. Small pericardial effusion with a maximum thickness of 1.7 cm. The included upper abdomen is unremarkable. Thoracic spine degenerative changes.  Review of the MIP images confirms the above findings.  IMPRESSION: 1. Suboptimal visualization of the pulmonary arteries with no visible pulmonary emboli. 2. Bilateral cavitary pneumonia with an overall increase in cavitation and decrease in consolidation. Differential considerations include Klebsiella, Staphylococcus and fungal infections. Tuberculosis also needs to be excluded. 3. Small left pleural effusion, mildly improved. 4. Mild probable reactive subcarinal adenopathy with mild progression. 5. Small pericardial effusion. 6. Moderate-sized hiatal hernia.   Electronically Signed   By: Beckie Salts M.D.   On: 02/28/2015 20:16   [place] Extrem Low Bilat Comp  02/28/2015   CLINICAL DATA:  Dyspnea for the past 3 months.  EXAM: BILATERAL LOWER EXTREMITY VENOUS DOPPLER ULTRASOUND  TECHNIQUE: Gray-scale sonography with graded compression, as well as color Doppler and duplex ultrasound were performed to evaluate the lower extremity deep venous systems from the level of the common femoral vein and including the common femoral, femoral, profunda femoral, popliteal and calf veins including the posterior tibial, peroneal and gastrocnemius veins when visible. The superficial great saphenous vein was also interrogated. Spectral Doppler was utilized to evaluate flow at rest and with distal augmentation maneuvers in the common femoral, femoral and popliteal veins.  COMPARISON:  None.  FINDINGS: RIGHT LOWER EXTREMITY  Common Femoral Vein: No evidence of thrombus. Normal compressibility, respiratory phasicity and response to augmentation.  Saphenofemoral Junction: No evidence of thrombus. Normal compressibility and flow on color Doppler imaging.  Profunda Femoral Vein: No evidence of thrombus. Normal compressibility and flow on  color Doppler imaging.  Femoral Vein: No evidence of thrombus. Normal compressibility, respiratory phasicity and response to augmentation.  Popliteal Vein: No evidence of thrombus. Normal compressibility, respiratory phasicity and response to augmentation.  Calf Veins: No evidence of thrombus. Normal compressibility and flow on color Doppler imaging.  Superficial Great Saphenous Vein: No evidence of thrombus. Normal compressibility and flow on color Doppler imaging.  Venous Reflux:  None.  Other Findings:  None.  LEFT LOWER EXTREMITY  Common Femoral Vein: No evidence of thrombus. Normal compressibility, respiratory phasicity and response to augmentation.  Saphenofemoral Junction: No evidence of thrombus. Normal compressibility and flow on color Doppler imaging.  Profunda Femoral Vein: No evidence of thrombus. Normal compressibility and flow on color Doppler imaging.  Femoral Vein: No evidence of thrombus. Normal compressibility, respiratory phasicity and response to augmentation.  Popliteal Vein: No evidence of thrombus. Normal compressibility, respiratory phasicity and response to augmentation.  Calf Veins: Near occlusive thrombosis in one of the left posterior tibial veins.  Superficial Great Saphenous Vein: No evidence of thrombus. Normal compressibility and flow on color Doppler imaging.  Venous Reflux:  None.  Other Findings:  None.  IMPRESSION: Near occlusive thrombus in one of the left posterior tibial veins. No other evidence of deep venous thrombosis.   Electronically Signed   By: Beckie Salts M.D.   On: 02/28/2015 19:50    Assessment/Plan: 54 yo with known active TB on treatment for approx 2 months readmitted  with progressive DOE, SOB. CT which I have reviewed and interpreted continues to show destructive cavitation of a significant amount of lung tissue. Since admit he has not had a fever and his wbc is normal. He has been tolerating his TB meds.He has had Afib with RVR and is in unit currently I  suspect he does not have a superinfection or any worsening of his TB but rather progressive COPD or possibly IRIS. He improved since starting steroids  Rec Cont TB meds. Per HD the AFB identified are potentially dead organism so I do not think this is progressive TB.   Agree with steroids and nebs - Would benefit from slower Prednisone taper over 3-4 weeks. Finish 10 days total of augmentin I will sign off Thank you for the consult.   Semiah Konczal   03/04/2015, 3:19 PM

## 2015-03-04 NOTE — Progress Notes (Addendum)
[facility] [place] at [facility]   PATIENT NAME: Marta AntuKelvin Pursley    MR#:  [id number]  DATE OF BIRTH:  09/19/1961  SUBJECTIVE:  CHIEF COMPLAINT:   Chief Complaint  Patient presents with  . Shortness of Breath   breathing is same as yesterday. Continues to have wheezing. Heart rate elevated to greater than 140s last night. So patient was moved to ICU for Cardizem drip. This morning heart rate is in 100-110 range and patient is on 5 mg per hour of Cardizem drip. He is also on his oral rate limiting medications.  REVIEW OF SYSTEMS:  ROS Constitutional: Negative for fever and chills.  Respiratory: Positive for shortness of breath and wheezing. Negative for cough.  Cardiovascular: Positive for palpitations and orthopnea. Negative for chest pain.  Gastrointestinal: Negative for nausea, vomiting, abdominal pain, diarrhea and constipation.  Genitourinary: Negative for dysuria.  Neurological: Negative for dizziness, seizures and headaches.  DRUG ALLERGIES:  No Known Allergies  VITALS:  Blood pressure 142/74, pulse 106, temperature 97.7 F (36.5 C), temperature source Oral, resp. rate 26, height 5\' 11"  (1.803 m), weight 153.407 kg (338 lb 3.2 oz), SpO2 99 %.  PHYSICAL EXAMINATION:  Physical Exam  GENERAL:  54 y.o.-year-old patient sitting in bed mildly dyspneic.  EYES: Pupils equal, round, reactive to light and accommodation. No scleral icterus. Extraocular muscles intact.  HEENT: Head atraumatic, normocephalic. Oropharynx and nasopharynx clear.  NECK:  Supple, no jugular venous distention. No thyroid enlargement, no tenderness.  LUNGS: Diffuse expiratory wheezes throughout both lungs. Wheezing is slightly better. No crackles. Minimal use of accessory muscles for breathing on exertion CARDIOVASCULAR: S1, S2 rapid rate and normal. No murmurs, rubs, or gallops.  ABDOMEN: Soft, nontender, nondistended. Bowel sounds present. No organomegaly or mass.  EXTREMITIES:  No pedal edema, cyanosis, or clubbing.  NEUROLOGIC: Cranial nerves II through XII are intact. Muscle strength 5/5 in all extremities. Sensation intact. Gait not checked.  PSYCHIATRIC: The patient is alert and oriented x 3.  SKIN: No obvious rash, lesion, or ulcer.    LABORATORY PANEL:   CBC  Recent Labs Lab 03/03/15 0536  WBC 7.0  HGB 12.3*  HCT 38.8*  PLT 196   ------------------------------------------------------------------------------------------------------------------  Chemistries   Recent Labs Lab 02/28/15 1348  03/03/15 0536  NA 141  < > 140  K 3.9  < > 4.0  CL 100*  < > 99*  CO2 32  < > 34*  GLUCOSE 95  < > 155*  BUN 6  < > 14  CREATININE 0.82  < > 0.84  CALCIUM 8.4*  < > 8.3*  AST 27  --   --   ALT 18  --   --   ALKPHOS 77  --   --   BILITOT 0.9  --   --   < > = values in this interval not displayed. ------------------------------------------------------------------------------------------------------------------  Cardiac Enzymes  Recent Labs Lab 02/28/15 1348  TROPONINI <0.03   ------------------------------------------------------------------------------------------------------------------  RADIOLOGY:  No results found.  EKG:   Orders placed or performed during the hospital encounter of 02/28/15  . ED EKG  . ED EKG    ASSESSMENT AND PLAN:   Marta AntuKelvin Mustin is a obese 54 y.o. male with a known history of A. fib on Coumadin, hypertension, COPD on 5 L home oxygen, sleep apnea on CPAP, being treated for tuberculosis by health department, recent admission to the hospital 2 weeks ago for pneumonia comes again secondary to worsening dyspnea.  #1 Acute  dyspnea- COPD exacerbation and recurrent TB. CT chest with cavitary lesions- likely from his tuberculosis and no superinfection per ID. Patient finished off his Augmentin on 03/02/2015.Marland Kitchen. Health department called with the sixth sputum positive again for Mycobacterium tuberculosis. So patient will be  put on airborne isolation. This will be treated as active tuberculosis. Appreciate ID and pulmonary consults. cont IV steroids and duonebs. Continue CPAP at bedtime. Consider repeat bronch if needed.  CT of the chest with IV contrast showed no PE and doppler lower extremities showed left post tibial vein DVT- pt already on coumadin.  Echo was done recently - need to verify results.  #2 Pulmonary tuberculosis- possible active tuberculosis as his most recent sputum came back positive for AFB. His last 5 sputum samples are negative. diagnosed about 2-3 months ago. on rifampin and INH and pyrazinamide at this time, followed by health department. Patient will be placed on airborne isolation now again.  #3 obstructive sleep apnea-continue CPAP at night.  #4 atrial fibrillation-  rate elevated while on digoxin, diltiazem and metoprolol. On Cardizem drip since last night. Currently down to 5 mg per hour. Likely can be weaned off the Cardizem drip. Also of note, discussion with pharmacist revealed that rifampin will decrease his Cardizem bioavailability. That's why his heart rate has been elevated. On Coumadin. INR is sub-therapeutic due to rifampin inspite of increasing doses of coumadin. Other anticoagulants also have the same issue.  Will start lovenox bid, likely needs to be discharged on the same.  #5 Acute thrombocytopenia-error on admission. Repeat numbers are normal.  Likely discharge once his breathing is improved. Consult with ID prior to discharge.  All the records are reviewed and case discussed with Care Management/Social Workerr. Management plans discussed with the patient, family and they are in agreement.  CODE STATUS: Full code  TOTAL TIME TAKING CARE OF THIS PATIENT: 37 minutes.   POSSIBLE D/C IN 2-3 DAYS, DEPENDING ON CLINICAL CONDITION.   Enid BaasKALISETTI,Arron Tetrault M.D on 03/04/2015 at 12:25 PM  Between 7am to 6pm - Pager - [phone]  After 6pm go to [email or web address] - password  [email or web address] [facility]  [facility]  Office  [phone]  CC: Primary care physician; Marisue IvanLINTHAVONG, KANHKA, MD

## 2015-03-05 LAB — BASIC METABOLIC PANEL
Anion gap: 3 — ABNORMAL LOW (ref 5–15)
BUN: 16 mg/dL (ref 6–20)
CHLORIDE: 99 mmol/L — AB (ref 101–111)
CO2: 37 mmol/L — AB (ref 22–32)
Calcium: 8 mg/dL — ABNORMAL LOW (ref 8.9–10.3)
Creatinine, Ser: 0.81 mg/dL (ref 0.61–1.24)
GFR calc Af Amer: >60 mL/min (ref >60)
GLUCOSE: 104 mg/dL — AB (ref 65–99)
Potassium: 4 mmol/L (ref 3.5–5.1)
SODIUM: 139 mmol/L (ref 135–145)

## 2015-03-05 LAB — PROTIME-INR
INR: 1.41
PROTHROMBIN TIME: 17.5 s — AB (ref 11.4–15.0)

## 2015-03-05 MED ORDER — AMOXICILLIN-POT CLAVULANATE 875-125 MG PO TABS: 1.0000 | ORAL_TABLET | Freq: Two times a day (BID) | ORAL | Status: AC

## 2015-03-05 MED ORDER — ENOXAPARIN SODIUM 150 MG/ML ~~LOC~~ SOLN: 150.0000 mg | Freq: Two times a day (BID) | SUBCUTANEOUS | Status: DC

## 2015-03-05 MED ORDER — PREDNISONE 5 MG PO TABS: 5.0000 mg | ORAL_TABLET | Freq: Every day | ORAL | Status: DC

## 2015-03-05 MED ORDER — SODIUM CHLORIDE 0.9 % IJ SOLN
3.0000 mL | Freq: Two times a day (BID) | INTRAMUSCULAR | Status: DC
  Administered 2015-03-05: 3 mL via INTRAVENOUS

## 2015-03-05 MED ORDER — SODIUM CHLORIDE 0.9 % IJ SOLN: 3.0000 mL | INTRAMUSCULAR | Status: DC | PRN

## 2015-03-05 NOTE — Progress Notes (Signed)
RD Assessment  Admitted with: right leg pain PMHx: HTN, COPD, Obesity, HLD  Current Diet: Heart healthy Typical Food/ Fluid Intake: 100% recorded per I/O x 2 days ago; spoke with patient and he says that he is eating well here in the hospital Meal/ Snack Patterns: Patient reports a good appetite and intake of a regular diet PTA.  Supplements: None  Food Allergies: NKFA Food Preferences: Reviewed  Ht: 71" Current weight: 338# BMI: 47.2 Weight Changes: Patient denies weight changes  UOP:   Intake/Output Summary (Last 24 hours) at 03/05/15 1022 Last data filed at 03/05/15 [id number]  Gross per 24 hour  Intake      3 ml  Output   1405 ml  Net  -1402 ml    Digestive: reviewed Gastrointestinal: reviewed Skin: reviewed Physical Findings: pt on airborne precautions  Labs: Electrolyte and Renal Profile:    Recent Labs Lab 03/01/15 0441 03/03/15 0536 03/05/15 0534  BUN 8 14 16   CREATININE 0.78 0.84 0.81  NA 139 140 139  K 4.0 4.0 4.0   Protein Profile:  Recent Labs Lab 02/28/15 1348  ALBUMIN 3.0*    Meds: Lasix, B-6  PES Statement: No nutrition concerns at this time.  Intervention: Meals and Snacks: Cater to patient preferences  Monitoring/ Evaluation: Energy Intake: goal for patient to meet >90% of estimated needs.  Joeseph Amorracey L. Gaines, RDN Pager: [phone] Office: [phone]  LOW Care Level

## 2015-03-05 NOTE — Discharge Instructions (Signed)
Tuberculosis Tuberculosis (TB) is a serious infection. TB often attacks the lungs, but any part of the body can be affected. TB can spread from person to person (contagious). TB is often spread by coughing or sneezing. TB can be treated with medicines. If TB is not treated, it can be life-threatening. HOME CARE  Take your medicines (antibiotics) as told. Finish them even if you start to feel better.  Keep all doctor visits as told. You will have doctor visits for at least 2 years.  Tell your doctor about all the people you live with or have close contact with. They may need to be tested for TB.  Eat a healthy diet.  Rest as needed.  Until your doctor says it is okay:  Avoid close contact with others, especially babies and older people.  Cover your mouth and nose when you cough or sneeze. Throw away used tissues properly.  Wash your hands often with soap and water.  Do not go back to work or school. GET HELP RIGHT AWAY IF:   You have chest pain.  You cough up blood.  You have trouble breathing or shortness of breath.  You have a headache or stiff neck.  You have a fever.  The patient is older than 3 months with a rectal temperature of 102 F (38.9 C) or higher.  The patient is 753 months old or younger with a rectal temperature of 100.4 F (38 C) or higher.  You have new problems that may be caused by your medicine.  You lose your appetite, feel sick to your stomach (nauseous), or throw up (vomit).  Your pee (urine) is dark yellow.  Your skin or the white part of your eyes turns yellow.  Your problems do not go away or get worse.  You have a new cough, or your cough lasts longer than 3 to 4 weeks.  You keep losing weight. MAKE SURE YOU:  Understand these instructions.  Will watch your condition.  Will get help right away if you are not doing well or get worse. Document Released: 08/05/2009 Document Revised: 12/31/2011 Document Reviewed: 01/13/2014 [facility]  Patient Information 2015 [employer] [place]. This information is not intended to replace advice given to you by your health care provider. Make sure you discuss any questions you have with your health care provider.

## 2015-03-05 NOTE — Progress Notes (Addendum)
Pt. Discharged to home. Discharge instructions and medication regimen reviewed at bedside with patient. Pt. verbalizes understanding of instructions and medication regimen. Prescriptions sent with patient and one was called into his pharmacy. Pt is not going home with lovenox after all and will continue his warfarin regimen pta. Patient assessment unchanged from this morning. TELE and IV discontinued per policy.   pt taken to main entrance via wheelchair with belongings in hand and wearing surgical mask.

## 2015-03-05 NOTE — Discharge Summary (Addendum)
[facility] [place] [facility]   PATIENT NAME: Jonathan AntuKelvin [name]    MR#:  [id number]  DATE OF BIRTH:  06/06/1961  DATE OF ADMISSION:  02/28/2015 ADMITTING PHYSICIAN: Enid Baasadhika Kalisetti, MD  DATE OF DISCHARGE:03/05/2015 PRIMARY CARE PHYSICIAN: Marisue IvanLINTHAVONG, KANHKA, MD    ADMISSION DIAGNOSIS:  Dyspnea [R06.00] Recurrent pneumonia [J18.9]  DISCHARGE DIAGNOSIS:  Principal Problem:   Dyspnea Active Problems:   A-fib   SECONDARY DIAGNOSIS:   Past Medical History  Diagnosis Date  . Atrial fibrillation   . Hypertension   . Tuberculosis   . Sleep apnea   . COPD (chronic obstructive pulmonary disease)     on 5l home oxygen  . Obesity   . Gout   . Hyperlipidemia     HOSPITAL COURSE:  This is a 54 year old male with known history of atrial fibrillation on Coumadin, COPD on 4-5 L of oxygen at home, sleep apnea on CPAP machine, and recent diagnosis of tuberculosis who presented with shortness of breath. For further details please refer to H&P.  1.acute dyspnea: This is secondary to acute COPD exacerbation. Infectious disease was consulted to evaluate for progressive tuberculosis. Dr. Sampson GoonFitzgerald did not believe that the patient had progressive tuberculosis. He he was felt to have acute dyspnea secondary to acute COPD exacerbation. On discharge he is doing quite well. He has no wheezing on examination. He is on his baseline oxygen. He will continue with steroid taper and outpatient  Inhalers. He will have a slow oral steroid taper. He also obtained a CT of the chest with IV contrast which was negative for pulmonary emboli. His lower extremity Dopplers were negative for DVT.  2. Pulmonary tuberculosis: Patient is being followed closely with the health department. He will continue triple therapy with rifampin, INH, and pyrazinamide. He was on airborne isolation during hospitalization.  3. Obstructive sleep apnea: Patient will continue CPAP machine at night. 4. Atrial  fibrillation: Patient did have an episode of RVR. He was started on a diltiazem drip. Dr. Evette GeorgesParashos from cardiology was consultative for further evaluation and management. Patient's heart rate is better controlled. He'll continue with his outpatient medications. He is currently on digoxin, diltiazem, and metoprolol. He is also on Coumadin as an outpatient. His INR was subtherapeutic due to RIFAMPIN. He was started on Lovenox while in the hospital.He cannot afford Lovenox, so I asked to him to make sure he has close follow up with HIS PCP for routine INR checks.    DISCHARGE CONDITIONS AND DIET:  STABLE Regular hert healthy  CONSULTS OBTAINED:   Dr. Sampson GoonFitzgerald Dr. Evette GeorgesPArashos  DRUG ALLERGIES:  No Known Allergies  DISCHARGE MEDICATIONS:   Current Discharge Medication List    START taking these medications   Details  enoxaparin (LOVENOX) 150 MG/ML injection Inject 1 mL (150 mg total) into the skin every 12 (twelve) hours. Continue this not coumadin while you are taking RIFAMPIN, as RIFAMPIN lowers the coumadin level rendering it not effective. STOP TAKING IF YOU NOTICE DARK STOOLS, BLOODY STOOLS OR VOMITING BLOOD Qty: 60 mL, Refills: 0    predniSONE (DELTASONE) 5 MG tablet Take 1 tablet (5 mg total) by mouth daily with breakfast. Label  & dispense according to the schedule below: 60 mg PO x 3 days 50 mg PO x 3 days 40 mg PO x 3 days 30 mg PO x 3 days 20 mg PO x 3 days 10 mg PO x 3 days then stop Qty: 63 tablet, Refills: 0  CONTINUE these medications which have CHANGED   Details  amoxicillin-clavulanate (AUGMENTIN) 875-125 MG per tablet Take 1 tablet by mouth 2 (two) times daily. Qty: 5 tablet, Refills: 0      CONTINUE these medications which have NOT CHANGED   Details  albuterol (PROVENTIL HFA;VENTOLIN HFA) 108 (90 BASE) MCG/ACT inhaler Inhale 2 puffs into the lungs every 6 (six) hours as needed for wheezing or shortness of breath.    allopurinol (ZYLOPRIM) 300 MG tablet  Take 300 mg by mouth daily.    colchicine 0.6 MG tablet Take 0.6 mg by mouth daily.    digoxin (LANOXIN) 0.25 MG tablet Take 0.25 mg by mouth daily.    diltiazem (CARDIZEM CD) 300 MG 24 hr capsule Take 300 mg by mouth daily.    ethambutol (MYAMBUTOL) 400 MG tablet Take 1,600 mg by mouth daily.    Fluticasone Furoate-Vilanterol 100-25 MCG/INH AEPB Inhale 1 puff into the lungs daily.     furosemide (LASIX) 40 MG tablet Take 40 mg by mouth daily.    HYDROcodone-homatropine (HYCODAN) 5-1.5 MG/5ML syrup Take 5 mLs by mouth every 6 (six) hours as needed for cough.    isoniazid (NYDRAZID) 300 MG tablet Take 300 mg by mouth daily.    losartan (COZAAR) 50 MG tablet Take 50 mg by mouth daily.    lovastatin (MEVACOR) 20 MG tablet Take 20 mg by mouth daily.    metoprolol (LOPRESSOR) 50 MG tablet Take 50 mg by mouth 2 (two) times daily.    pyrazinamide 500 MG tablet Take 2,000 mg by mouth daily.    rifampin (RIFADIN) 300 MG capsule Take 600 mg by mouth daily.    vitamin B-6 (PYRIDOXINE) 25 MG tablet Take 25 mg by mouth daily.      STOP taking these medications     warfarin (COUMADIN) 10 MG tablet      warfarin (COUMADIN) 4 MG tablet               Today   CHIEF COMPLAINT:  Patient is doing well this morning. He has no wheezing or shortness of breath. He is on his baseline oxygen.   VITAL SIGNS:  Blood pressure 92/50, pulse 92, temperature 98.3 F (36.8 C), temperature source Oral, resp. rate 20, height 5\' 11"  (1.803 m), weight 153.407 kg (338 lb 3.2 oz), SpO2 100 %.   REVIEW OF SYSTEMS:  Review of Systems  Constitutional: Negative for fever and chills.  Respiratory: Negative for cough, hemoptysis, sputum production, shortness of breath and wheezing.   Cardiovascular: Negative for chest pain, palpitations and orthopnea.  Gastrointestinal: Negative for vomiting and abdominal pain.  Neurological: Negative for tremors and headaches.     PHYSICAL EXAMINATION:   GENERAL:  54 y.o.-year-old patient lying in the bed with no acute distress.  NECK:  Supple, no jugular venous distention. No thyroid enlargement, no tenderness.  LUNGS: Normal breath sounds bilaterally, no wheezing, rales,rhonchi  No use of accessory muscles of respiration.  CARDIOVASCULAR: S1, S2 normal. No murmurs, rubs, or gallops.  ABDOMEN: Soft, non-tender, non-distended. Bowel sounds present. No organomegaly or mass.  EXTREMITIES: No pedal edema, cyanosis, or clubbing.  PSYCHIATRIC: The patient is alert and oriented x 3.  SKIN: No obvious rash, lesion, or ulcer.   DATA REVIEW:   CBC  Recent Labs Lab 03/03/15 0536  WBC 7.0  HGB 12.3*  HCT 38.8*  PLT 196    Chemistries   Recent Labs Lab 02/28/15 1348  03/05/15 0534  NA 141  < >  139  K 3.9  < > 4.0  CL 100*  < > 99*  CO2 32  < > 37*  GLUCOSE 95  < > 104*  BUN 6  < > 16  CREATININE 0.82  < > 0.81  CALCIUM 8.4*  < > 8.0*  AST 27  --   --   ALT 18  --   --   ALKPHOS 77  --   --   BILITOT 0.9  --   --   < > = values in this interval not displayed.  Cardiac Enzymes  Recent Labs Lab 02/28/15 1348  TROPONINI <0.03    Microbiology Results  @  RADIOLOGY:  No results found.    Management plans discussed with the patient and he is in agreement. Stable for discharge home  Patient should follow up with Health Department and PCP  CODE STATUS:     Code Status Orders        Start     Ordered   02/28/15 1821  Full code   Continuous     02/28/15 1820      TOTAL TIME TAKING CARE OF THIS PATIENT: 35 minutes.    Ica Daye M.D on 03/05/2015 at 12:27 PM  Between 7am to 6pm - Pager - [phone] After 6pm go to [email or web address] - password [email or web address] [facility]  [place] [facility]  Office  [phone]  CC: Primary care physician; Marisue Ivan, MD

## 2015-03-05 NOTE — Progress Notes (Signed)
Notified nursing supervisor about pt discharge later this morning, supervisor said that he can be discharged with just a surgical mask on, no N-95 needed.

## 2015-03-05 NOTE — Progress Notes (Signed)
Dr. Juliene PinaMody paged that patient would like to speak with her regarding home Lovenox injections, Dr. Fredna DowSays she will phone into room. Care manager also notified of pt home Lovenox prescription and asked about any coupons or anything to help patient afford medication. Lyn says she will get back to me.

## 2015-03-15 LAB — AFB ID BY DNA PROBE
M TUBERCULOSIS COMPLEX: POSITIVE — AB
M avium complex: NEGATIVE

## 2015-03-15 LAB — AFB CULTURE WITH SMEAR (NOT AT ARMC)
ACID FAST CULTURE: POSITIVE — AB
Acid Fast Smear: NEGATIVE

## 2015-05-30 ENCOUNTER — Ambulatory Visit
Admission: RE | Admit: 2015-05-30 | Discharge: 2015-05-30 | Disposition: A | Discharge status: Home / Self Care | Payer: Medicaid Other | Source: Ambulatory Visit | Attending: Internal Medicine | Admitting: Internal Medicine

## 2015-05-30 ENCOUNTER — Other Ambulatory Visit: Payer: Self-pay | Admitting: Internal Medicine

## 2015-05-30 DIAGNOSIS — R600 Localized edema: Secondary | ICD-10-CM | POA: Diagnosis present

## 2015-08-17 ENCOUNTER — Encounter: Payer: Self-pay | Admitting: *Deleted

## 2015-08-17 ENCOUNTER — Emergency Department: Payer: Medicaid Other

## 2015-08-17 ENCOUNTER — Inpatient Hospital Stay
Admission: EM | Admit: 2015-08-17 | Discharge: 2015-08-26 | DRG: 190 | Disposition: A | Discharge status: Home / Self Care | Payer: Medicaid Other | Attending: Internal Medicine | Admitting: Internal Medicine

## 2015-08-17 DIAGNOSIS — Z6841 Body Mass Index (BMI) 40.0 and over, adult: Secondary | ICD-10-CM

## 2015-08-17 DIAGNOSIS — R0609 Other forms of dyspnea: Secondary | ICD-10-CM

## 2015-08-17 DIAGNOSIS — I4891 Unspecified atrial fibrillation: Secondary | ICD-10-CM | POA: Diagnosis present

## 2015-08-17 DIAGNOSIS — Z7901 Long term (current) use of anticoagulants: Secondary | ICD-10-CM

## 2015-08-17 DIAGNOSIS — I110 Hypertensive heart disease with heart failure: Secondary | ICD-10-CM | POA: Diagnosis present

## 2015-08-17 DIAGNOSIS — Z79899 Other long term (current) drug therapy: Secondary | ICD-10-CM

## 2015-08-17 DIAGNOSIS — E785 Hyperlipidemia, unspecified: Secondary | ICD-10-CM | POA: Diagnosis present

## 2015-08-17 DIAGNOSIS — R791 Abnormal coagulation profile: Secondary | ICD-10-CM | POA: Diagnosis present

## 2015-08-17 DIAGNOSIS — E669 Obesity, unspecified: Secondary | ICD-10-CM | POA: Diagnosis present

## 2015-08-17 DIAGNOSIS — N179 Acute kidney failure, unspecified: Secondary | ICD-10-CM | POA: Diagnosis present

## 2015-08-17 DIAGNOSIS — G4733 Obstructive sleep apnea (adult) (pediatric): Secondary | ICD-10-CM | POA: Diagnosis present

## 2015-08-17 DIAGNOSIS — J441 Chronic obstructive pulmonary disease with (acute) exacerbation: Principal | ICD-10-CM | POA: Diagnosis present

## 2015-08-17 DIAGNOSIS — J45909 Unspecified asthma, uncomplicated: Secondary | ICD-10-CM | POA: Diagnosis present

## 2015-08-17 DIAGNOSIS — I4892 Unspecified atrial flutter: Secondary | ICD-10-CM | POA: Diagnosis present

## 2015-08-17 DIAGNOSIS — Z8611 Personal history of tuberculosis: Secondary | ICD-10-CM | POA: Diagnosis not present

## 2015-08-17 DIAGNOSIS — M109 Gout, unspecified: Secondary | ICD-10-CM | POA: Diagnosis present

## 2015-08-17 DIAGNOSIS — G4730 Sleep apnea, unspecified: Secondary | ICD-10-CM

## 2015-08-17 DIAGNOSIS — Z9981 Dependence on supplemental oxygen: Secondary | ICD-10-CM

## 2015-08-17 DIAGNOSIS — I482 Chronic atrial fibrillation: Secondary | ICD-10-CM | POA: Diagnosis present

## 2015-08-17 DIAGNOSIS — J9621 Acute and chronic respiratory failure with hypoxia: Secondary | ICD-10-CM | POA: Diagnosis present

## 2015-08-17 DIAGNOSIS — T45515A Adverse effect of anticoagulants, initial encounter: Secondary | ICD-10-CM | POA: Diagnosis present

## 2015-08-17 DIAGNOSIS — J45901 Unspecified asthma with (acute) exacerbation: Secondary | ICD-10-CM

## 2015-08-17 DIAGNOSIS — I480 Paroxysmal atrial fibrillation: Secondary | ICD-10-CM | POA: Diagnosis present

## 2015-08-17 DIAGNOSIS — I5032 Chronic diastolic (congestive) heart failure: Secondary | ICD-10-CM | POA: Diagnosis present

## 2015-08-17 DIAGNOSIS — J9600 Acute respiratory failure, unspecified whether with hypoxia or hypercapnia: Secondary | ICD-10-CM | POA: Diagnosis present

## 2015-08-17 DIAGNOSIS — R17 Unspecified jaundice: Secondary | ICD-10-CM | POA: Diagnosis not present

## 2015-08-17 DIAGNOSIS — G4710 Hypersomnia, unspecified: Secondary | ICD-10-CM | POA: Diagnosis present

## 2015-08-17 DIAGNOSIS — I5033 Acute on chronic diastolic (congestive) heart failure: Secondary | ICD-10-CM | POA: Diagnosis present

## 2015-08-17 DIAGNOSIS — I509 Heart failure, unspecified: Secondary | ICD-10-CM

## 2015-08-17 HISTORY — DX: Chronic obstructive pulmonary disease with (acute) exacerbation: J44.1

## 2015-08-17 HISTORY — DX: Acute on chronic diastolic (congestive) heart failure: I50.33

## 2015-08-17 HISTORY — DX: Unspecified asthma with (acute) exacerbation: J45.901

## 2015-08-17 LAB — COMPREHENSIVE METABOLIC PANEL
ALT: 25 U/L (ref 17–63)
ANION GAP: 13 (ref 5–15)
AST: 35 U/L (ref 15–41)
Albumin: 3.7 g/dL (ref 3.5–5.0)
Alkaline Phosphatase: 61 U/L (ref 38–126)
BILIRUBIN TOTAL: 3.1 mg/dL — AB (ref 0.3–1.2)
BUN: 48 mg/dL — AB (ref 6–20)
CO2: 32 mmol/L (ref 22–32)
Calcium: 9 mg/dL (ref 8.9–10.3)
Chloride: 93 mmol/L — ABNORMAL LOW (ref 101–111)
Creatinine, Ser: 1.56 mg/dL — ABNORMAL HIGH (ref 0.61–1.24)
GFR calc Af Amer: 56 mL/min — ABNORMAL LOW (ref >60)
GFR, EST NON AFRICAN AMERICAN: 48 mL/min — AB (ref >60)
Glucose, Bld: 132 mg/dL — ABNORMAL HIGH (ref 65–99)
POTASSIUM: 3.8 mmol/L (ref 3.5–5.1)
Sodium: 138 mmol/L (ref 135–145)
TOTAL PROTEIN: 7.3 g/dL (ref 6.5–8.1)

## 2015-08-17 LAB — CBC WITH DIFFERENTIAL/PLATELET
Basophils Absolute: 0 10*3/uL (ref 0–0.1)
Basophils Relative: 1 %
EOS ABS: 0.1 10*3/uL (ref 0–0.7)
Eosinophils Relative: 1 %
HCT: 51 % (ref 40.0–52.0)
HEMOGLOBIN: 16 g/dL (ref 13.0–18.0)
Lymphocytes Relative: 2 %
Lymphs Abs: 0.2 10*3/uL — ABNORMAL LOW (ref 1.0–3.6)
MCH: 27.3 pg (ref 26.0–34.0)
MCHC: 31.4 g/dL — AB (ref 32.0–36.0)
MCV: 86.9 fL (ref 80.0–100.0)
Monocytes Absolute: 0.9 10*3/uL (ref 0.2–1.0)
Monocytes Relative: 10 %
NEUTROS PCT: 86 %
Neutro Abs: 7.9 10*3/uL — ABNORMAL HIGH (ref 1.4–6.5)
Platelets: 159 10*3/uL (ref 150–440)
RBC: 5.87 MIL/uL (ref 4.40–5.90)
RDW: 18.4 % — AB (ref 11.5–14.5)
WBC: 9.1 10*3/uL (ref 3.8–10.6)

## 2015-08-17 LAB — TROPONIN I
TROPONIN I: 0.03 ng/mL (ref <0.031)
Troponin I: 0.03 ng/mL (ref <0.031)
Troponin I: 0.03 ng/mL (ref <0.031)

## 2015-08-17 LAB — BLOOD GAS, ARTERIAL
Acid-Base Excess: 9.1 mmol/L — ABNORMAL HIGH (ref 0.0–3.0)
BICARBONATE: 35.2 meq/L — AB (ref 21.0–28.0)
Delivery systems: POSITIVE
EXPIRATORY PAP: 6
FIO2: 0.7
Inspiratory PAP: 12
MECHANICAL RATE: 12
O2 SAT: 98.2 %
Patient temperature: 37
pCO2 arterial: 53 mmHg — ABNORMAL HIGH (ref 32.0–48.0)
pH, Arterial: 7.43 (ref 7.350–7.450)
pO2, Arterial: 106 mmHg (ref 83.0–108.0)

## 2015-08-17 LAB — PROTIME-INR
INR: >10
Prothrombin Time: >90 seconds — ABNORMAL HIGH (ref 11.4–15.0)

## 2015-08-17 LAB — BRAIN NATRIURETIC PEPTIDE: B Natriuretic Peptide: 669 pg/mL — ABNORMAL HIGH (ref 0.0–100.0)

## 2015-08-17 LAB — GLUCOSE, CAPILLARY: Glucose-Capillary: 116 mg/dL — ABNORMAL HIGH (ref 65–99)

## 2015-08-17 MED ORDER — METHYLPREDNISOLONE SODIUM SUCC 125 MG IJ SOLR
125.0000 mg | Freq: Once | INTRAMUSCULAR | Status: AC
  Administered 2015-08-17: 125 mg via INTRAVENOUS
  Filled 2015-08-17: qty 2

## 2015-08-17 MED ORDER — VITAMIN K1 1 MG/0.5ML IJ SOLN
1.0000 mg | Freq: Once | INTRAMUSCULAR | Status: AC
  Administered 2015-08-17: 1 mg via SUBCUTANEOUS
  Filled 2015-08-17: qty 0.5

## 2015-08-17 MED ORDER — SODIUM CHLORIDE 0.9 % IJ SOLN
3.0000 mL | Freq: Two times a day (BID) | INTRAMUSCULAR | Status: DC
  Administered 2015-08-17 – 2015-08-25 (×16): 3 mL via INTRAVENOUS

## 2015-08-17 MED ORDER — IPRATROPIUM-ALBUTEROL 0.5-2.5 (3) MG/3ML IN SOLN
RESPIRATORY_TRACT | Status: AC
  Filled 2015-08-17: qty 3

## 2015-08-17 MED ORDER — SODIUM CHLORIDE 0.9 % IV SOLN
250.0000 mL | INTRAVENOUS | Status: DC | PRN
  Administered 2015-08-18: 250 mL via INTRAVENOUS

## 2015-08-17 MED ORDER — DILTIAZEM HCL ER COATED BEADS 240 MG PO CP24
240.0000 mg | ORAL_CAPSULE | Freq: Every day | ORAL | Status: DC
  Administered 2015-08-18 – 2015-08-20 (×3): 240 mg via ORAL
  Filled 2015-08-17: qty 2
  Filled 2015-08-17: qty 1
  Filled 2015-08-17: qty 2
  Filled 2015-08-17 (×2): qty 1
  Filled 2015-08-17: qty 2
  Filled 2015-08-17: qty 1
  Filled 2015-08-17: qty 2

## 2015-08-17 MED ORDER — METHYLPREDNISOLONE SODIUM SUCC 125 MG IJ SOLR
60.0000 mg | Freq: Four times a day (QID) | INTRAMUSCULAR | Status: DC
  Administered 2015-08-17 – 2015-08-20 (×11): 60 mg via INTRAVENOUS
  Filled 2015-08-17 (×11): qty 2

## 2015-08-17 MED ORDER — ACETAMINOPHEN 650 MG RE SUPP: 650.0000 mg | Freq: Four times a day (QID) | RECTAL | Status: DC | PRN

## 2015-08-17 MED ORDER — IPRATROPIUM-ALBUTEROL 0.5-2.5 (3) MG/3ML IN SOLN
3.0000 mL | Freq: Four times a day (QID) | RESPIRATORY_TRACT | Status: DC
  Administered 2015-08-17 – 2015-08-26 (×36): 3 mL via RESPIRATORY_TRACT
  Filled 2015-08-17 (×36): qty 3

## 2015-08-17 MED ORDER — IPRATROPIUM-ALBUTEROL 0.5-2.5 (3) MG/3ML IN SOLN
3.0000 mL | Freq: Once | RESPIRATORY_TRACT | Status: AC
  Administered 2015-08-17: 12:00:00 via RESPIRATORY_TRACT

## 2015-08-17 MED ORDER — FLUTICASONE FUROATE-VILANTEROL 100-25 MCG/INH IN AEPB: 1.0000 | INHALATION_SPRAY | Freq: Every day | RESPIRATORY_TRACT | Status: DC

## 2015-08-17 MED ORDER — FUROSEMIDE 10 MG/ML IJ SOLN
40.0000 mg | Freq: Two times a day (BID) | INTRAMUSCULAR | Status: DC
  Administered 2015-08-17 – 2015-08-19 (×4): 40 mg via INTRAVENOUS
  Filled 2015-08-17 (×4): qty 4

## 2015-08-17 MED ORDER — ONDANSETRON HCL 4 MG PO TABS: 4.0000 mg | ORAL_TABLET | Freq: Four times a day (QID) | ORAL | Status: DC | PRN

## 2015-08-17 MED ORDER — MOMETASONE FURO-FORMOTEROL FUM 100-5 MCG/ACT IN AERO
2.0000 | INHALATION_SPRAY | Freq: Two times a day (BID) | RESPIRATORY_TRACT | Status: DC
  Administered 2015-08-17 – 2015-08-26 (×15): 2 via RESPIRATORY_TRACT
  Filled 2015-08-17: qty 8.8

## 2015-08-17 MED ORDER — HYDROCODONE-HOMATROPINE 5-1.5 MG/5ML PO SYRP: 5.0000 mL | ORAL_SOLUTION | Freq: Four times a day (QID) | ORAL | Status: DC | PRN

## 2015-08-17 MED ORDER — SODIUM CHLORIDE 0.9 % IJ SOLN
3.0000 mL | Freq: Two times a day (BID) | INTRAMUSCULAR | Status: DC
  Administered 2015-08-17 – 2015-08-21 (×6): 3 mL via INTRAVENOUS

## 2015-08-17 MED ORDER — ALBUTEROL SULFATE (2.5 MG/3ML) 0.083% IN NEBU: 2.5000 mg | INHALATION_SOLUTION | RESPIRATORY_TRACT | Status: DC | PRN

## 2015-08-17 MED ORDER — SENNA 8.6 MG PO TABS
1.0000 | ORAL_TABLET | Freq: Two times a day (BID) | ORAL | Status: DC
  Administered 2015-08-18 – 2015-08-26 (×9): 8.6 mg via ORAL
  Filled 2015-08-17 (×16): qty 1

## 2015-08-17 MED ORDER — PRAVASTATIN SODIUM 20 MG PO TABS
20.0000 mg | ORAL_TABLET | Freq: Every day | ORAL | Status: DC
  Administered 2015-08-17 – 2015-08-21 (×5): 20 mg via ORAL
  Filled 2015-08-17 (×9): qty 1

## 2015-08-17 MED ORDER — LEVOFLOXACIN IN D5W 500 MG/100ML IV SOLN
500.0000 mg | INTRAVENOUS | Status: DC
  Administered 2015-08-17 – 2015-08-19 (×3): 500 mg via INTRAVENOUS
  Filled 2015-08-17 (×4): qty 100

## 2015-08-17 MED ORDER — ONDANSETRON HCL 4 MG/2ML IJ SOLN: 4.0000 mg | Freq: Four times a day (QID) | INTRAMUSCULAR | Status: DC | PRN

## 2015-08-17 MED ORDER — ACETAMINOPHEN 325 MG PO TABS
650.0000 mg | ORAL_TABLET | Freq: Four times a day (QID) | ORAL | Status: DC | PRN
  Administered 2015-08-22 (×2): 650 mg via ORAL
  Filled 2015-08-17 (×2): qty 2

## 2015-08-17 MED ORDER — CETYLPYRIDINIUM CHLORIDE 0.05 % MT LIQD
7.0000 mL | Freq: Two times a day (BID) | OROMUCOSAL | Status: DC
  Administered 2015-08-17 – 2015-08-25 (×14): 7 mL via OROMUCOSAL

## 2015-08-17 MED ORDER — ZOLPIDEM TARTRATE 5 MG PO TABS
5.0000 mg | ORAL_TABLET | Freq: Every evening | ORAL | Status: DC | PRN
  Administered 2015-08-17 – 2015-08-23 (×7): 5 mg via ORAL
  Filled 2015-08-17 (×7): qty 1

## 2015-08-17 MED ORDER — FUROSEMIDE 10 MG/ML IJ SOLN
40.0000 mg | Freq: Once | INTRAMUSCULAR | Status: AC
  Administered 2015-08-17: 40 mg via INTRAVENOUS
  Filled 2015-08-17: qty 4

## 2015-08-17 MED ORDER — SODIUM CHLORIDE 0.9 % IJ SOLN: 3.0000 mL | INTRAMUSCULAR | Status: DC | PRN

## 2015-08-17 MED ORDER — ATENOLOL 50 MG PO TABS
50.0000 mg | ORAL_TABLET | Freq: Every day | ORAL | Status: DC
  Administered 2015-08-18 – 2015-08-20 (×3): 50 mg via ORAL
  Filled 2015-08-17 (×8): qty 1

## 2015-08-17 NOTE — Therapy (Signed)
Patient transported to CCU 12 via BiPap at ordered settings. Patient continues to be tachypnic but communicates that he feels better. [facility].

## 2015-08-17 NOTE — ED Notes (Signed)
Pt arrives from home via EMS for dyspnea, pt has history of COPD, CHF , pt requires home O2, pt arrives on 8L O2, o2 sat 73%, respiratory at bedside, pt using accessorry to breathe

## 2015-08-17 NOTE — Progress Notes (Signed)
ANTICOAGULATION CONSULT NOTE - Initial Consult  Pharmacy Consult for warfarin Indication: atrial fibrillation  No Known Allergies  Patient Measurements: Height: 5\' 11"  (180.3 cm) Weight: (!) 321 lb 14 oz (146 kg) IBW/kg (Calculated) : 75.3  Vital Signs: Temp: 97.5 F (36.4 C) (10/26 1500) Temp Source: Axillary (10/26 1500) BP: 110/78 mmHg (10/26 1800) Pulse Rate: 74 (10/26 1800)  Labs:  Recent Labs  08/17/15 1204 08/17/15 1636  HGB 16.0  --   HCT 51.0  --   PLT 159  --   LABPROT >90.0*  --   INR >10.00*  --   CREATININE 1.56*  --   TROPONINI 0.03 0.03    Estimated Creatinine Clearance: 77.5 mL/min (by C-G formula based on Cr of 1.56).   Medical History: Past Medical History  Diagnosis Date  . Atrial fibrillation (HCC)   . Hypertension   . Tuberculosis   . Sleep apnea   . COPD (chronic obstructive pulmonary disease) (HCC)     on 5l home oxygen  . Obesity   . Gout   . Hyperlipidemia    Assessment: Pharmacy consulted to dose warfarin for this 54 year old male with a history of paroxysmal atrial fibrillation who was taking warfarin 12 mg PO daily prior to admission. Patient's INR extremely elevated on presentation to the ED with an INR of >10. Vitamin K was ordered.   Per MD notes, patient may be transitioned from warfarin to dabigatran once INR is subtherapeutic.   Goal of Therapy:  INR 2-3 Monitor platelets by anticoagulation protocol: Yes   Plan:  Hold warfarin due to supratherapeutic INR  Jodelle RedMary M Ethylene Reznick 08/17/2015,7:01 PM

## 2015-08-17 NOTE — Progress Notes (Signed)
   08/17/15 1900  Clinical Encounter Type  Visited With Patient  Visit Type Initial;Spiritual support  Referral From Nurse  Consult/Referral To Chaplain  Spiritual Encounters  Spiritual Needs Other (Comment)  Stress Factors  Patient Stress Factors None identified  Chaplain received an order that the patient desired prayer. Checked in with patient after identifying self and offered support. Patient was texting at the time and did not state a need. I told him I would check back with him again. Chaplain Corin Formisano A,. Briseida Gittings Ext. [phone]

## 2015-08-17 NOTE — Consult Note (Signed)
[facility] Cardiology  CARDIOLOGY CONSULT NOTE  Patient ID: Jonathan Pace Sr. MRN: [id number] DOB/AGE: [birth year] 56 y.o.  Admit date: 08/17/2015 Referring Physician Gouru Primary Physician Thedore Mins Primary Cardiologist Jamse Mead M.D. Reason for Consultation congestive heart failure  HPI: 54 year old gentleman referred for congestive heart failure. The patient has multiple comorbidities including paroxysmal atrial fibrillation/atrial flutter, essential hypertension, chronic diastolic congestive heart failure, COPD, pulmonary hypertension, and pulmonary tuberculosis. Patient has known normal coronary anatomy by cardiac catheterization 09/20/14. Recent echo car gram 04/23/15 revealed normal left ventricular function with LV ejection fraction greater than 55%. Patient does have a history of chronic diastolic congestive heart failure, however, reports that recently been experiencing any significant fluid retention, and his weight has been down by 12 pounds. However, the patient has been experiencing worsening exertional dyspnea, presented to [facility] emergency room with hypoxia, mid to the ICU, treated with BiPAP, and intravenous steroids with overall clinical improvement.chest x-ray does reveal cardiomegaly with early interstitial pulmonary edema. Lab work notable for negative troponin. BUN and creatinine are slightly elevated.the patient has paroxysmal atrial fibrillation/atrial flutter, recently on warfarin INR greater than 10.0 on admission.  Review of systems complete and found to be negative unless listed above     Past Medical History  Diagnosis Date  . Atrial fibrillation (HCC)   . Hypertension   . Tuberculosis   . Sleep apnea   . COPD (chronic obstructive pulmonary disease) (HCC)     on 5l home oxygen  . Obesity   . Gout   . Hyperlipidemia     Past Surgical History  Procedure Laterality Date  . Bronchoscopy      Prescriptions prior to admission  Medication Sig Dispense Refill Last Dose   . allopurinol (ZYLOPRIM) 300 MG tablet Take 300 mg by mouth daily.   unknown at unknown  . atenolol (TENORMIN) 50 MG tablet Take 50 mg by mouth daily.   unknown at unknown  . colchicine 0.6 MG tablet Take 0.6 mg by mouth 2 (two) times daily as needed (for gout flares).    PRN at PRN  . diltiazem (CARDIZEM CD) 240 MG 24 hr capsule Take 240 mg by mouth daily.   unknown at unknown  . fluticasone (FLONASE) 50 MCG/ACT nasal spray Place 2 sprays into both nostrils daily.    unknown at unknown  . ipratropium-albuterol (DUONEB) 0.5-2.5 (3) MG/3ML SOLN Take 3 mLs by nebulization 4 (four) times daily as needed (shortness of breath/wheezing).   PRN at PRN  . lovastatin (MEVACOR) 20 MG tablet Take 20 mg by mouth at bedtime.    unknown at unknown  . torsemide (DEMADEX) 20 MG tablet Take 40 mg by mouth daily.   unknown at unknown  . vitamin B-6 (PYRIDOXINE) 25 MG tablet Take 25 mg by mouth daily.   unknown at unknown  . warfarin (COUMADIN) 1 MG tablet Take 2 mg by mouth daily. Pt takes with a  tablet.   unknown at unknown  . warfarin (COUMADIN) 10 MG tablet Take 10 mg by mouth daily. Pt takes with two  tablets.   unknown at unknown   Social History   Social History  . Marital Status: Married    Spouse Name: N/A  . Number of Children: N/A  . Years of Education: N/A   Occupational History  . Not on file.   Social History Main Topics  . Smoking status: Never Smoker   . Smokeless tobacco: Not on file  . Alcohol Use: Yes  . Drug Use:  No  . Sexual Activity: Not on file   Other Topics Concern  . Not on file   Social History Narrative    Family History  Problem Relation Age of Onset  . Cancer Mother   . Emphysema Father       Review of systems complete and found to be negative unless listed above      PHYSICAL EXAM  General: Well developed, well nourished, in no acute distress HEENT:  Normocephalic and atramatic Neck:  No JVD.  Lungs: Clear bilaterally to auscultation and  percussion. Heart: HRRR . Normal S1 and S2 without gallops or murmurs.  Abdomen: Bowel sounds are positive, abdomen soft and non-tender  Msk:  Back normal, normal gait. Normal strength and tone for age. Extremities: No clubbing, cyanosis or edema.   Neuro: Alert and oriented X 3. Psych:  Good affect, responds appropriately  Labs:   Lab Results  Component Value Date   WBC 9.1 08/17/2015   HGB 16.0 08/17/2015   HCT 51.0 08/17/2015   MCV 86.9 08/17/2015   PLT 159 08/17/2015    Recent Labs Lab 08/17/15 1204  NA 138  K 3.8  CL 93*  CO2 32  BUN 48*  CREATININE 1.56*  CALCIUM 9.0  PROT 7.3  BILITOT 3.1*  ALKPHOS 61  ALT 25  AST 35  GLUCOSE 132*   Lab Results  Component Value Date   TROPONINI 0.03 08/17/2015    Lab Results  Component Value Date   CHOL 176 02/09/2015   Lab Results  Component Value Date   HDL 47 02/09/2015   Lab Results  Component Value Date   LDLCALC 116* 02/09/2015   Lab Results  Component Value Date   TRIG 66 02/09/2015   No results found for: CHOLHDL No results found for: LDLDIRECT    Radiology: Dg Chest Portable 1 View  08/17/2015  CLINICAL DATA:  Shortness of breath, COPD, CHF EXAM: PORTABLE CHEST 1 VIEW COMPARISON:  02/28/2015 FINDINGS: Limited exam because of portable technique and body habitus. Cardiomegaly evident with diffuse increased interstitial type opacities concerning for edema. Early CHF is favored. Low lung volumes with basilar atelectasis. No large effusion or pneumothorax. Trachea midline. IMPRESSION: Cardiomegaly with mild early interstitial edema pattern. Electronically Signed   By: Judie PetitM.  Shick M.D.   On: 08/17/2015 12:26    EKG: normal sinus rhythm  ASSESSMENT AND PLAN:   1. Respiratory failure, multifactorial, secondary to COPD exacerbation and diastolic congestive heart failure. Overall, patient reports that peripheral edema has been under control and he has not been experiencing fluid retention. BUN and creatinine are  slightly elevated consistent with patient's reports. Chest x-ray reveals mild interstitial edema. 2. Paroxysmal atrial fibrillation/atrial flutter, on warfarin, with INR greater than 10  Recommendations  1. Agree with overall current therapy 2. Continue diuresis with caution 3. Follow renal status closely 4. Hold warfarin. Once subtherapeutic, restart Pradaxa for stroke prevention 5. Defer further cardiac diagnostics at this time   Signed: Johntae Broxterman MD,PhD, [facility] 08/17/2015, 4:52 PM

## 2015-08-17 NOTE — H&P (Signed)
[facility]   PATIENT NAME: Jonathan [name]    MR#:  [id number]  DATE OF BIRTH:  Oct 14, 1961  DATE OF ADMISSION:  08/17/2015  PRIMARY CARE PHYSICIAN: Singh,Jasmine, MD   REQUESTING/REFERRING PHYSICIAN: Dr.Gayle  CHIEF COMPLAINT:   SOB HISTORY OF PRESENT ILLNESS:  Federick [name]  is a 54 y.o. male with a known history of chronic COPD, lives on 5 L of oxygen at home, chronic congestive heart failure, obstructive sleep apnea, chronic atrial fibrillation currently on Coumadin and multiple other medical problems is present into the ED with a chief complaint of worsening of shortness of breath. As per EMS patient was on 8 L of oxygen via nasal cannula. Patient was placed on Ventimask with FiO2 50% and his SPO2 was 65% respiratory rate was 40 and heart rate was 120. Patient is brought into the ED he was placed on BiPAP. Solum Medrol 125 mg IV was given and Lasix 40 mg IV was given as chest x-ray has revealed mild interstitial edema. Hospitalist team is called to admit the patient. Patient reports that the he was seen by Dr. Meredeth Ide lately and Parashos just 5 days ago  PAST MEDICAL HISTORY:   Past Medical History  Diagnosis Date  . Atrial fibrillation (HCC)   . Hypertension   . Tuberculosis   . Sleep apnea   . COPD (chronic obstructive pulmonary disease) (HCC)     on 5l home oxygen  . Obesity   . Gout   . Hyperlipidemia     PAST SURGICAL HISTOIRY:   Past Surgical History  Procedure Laterality Date  . Bronchoscopy      SOCIAL HISTORY:   Social History  Substance Use Topics  . Smoking status: Never Smoker   . Smokeless tobacco: Not on file  . Alcohol Use: Yes    FAMILY HISTORY:   Family History  Problem Relation Age of Onset  . Cancer Mother   . Emphysema Father     DRUG ALLERGIES:  No Known Allergies  REVIEW OF SYSTEMS:  CONSTITUTIONAL: No fever, fatigue or weakness.  EYES: No blurred or double vision.  EARS, NOSE,  AND THROAT: No tinnitus or ear pain.  RESPIRATORY: reporting cough, worsening of shortness of breath, wheezing . Denies hemoptysis.  CARDIOVASCULAR: No chest pain, orthopnea, edema.  GASTROINTESTINAL: No nausea, vomiting, diarrhea or abdominal pain.  GENITOURINARY: No dysuria, hematuria.  ENDOCRINE: No polyuria, nocturia,  HEMATOLOGY: No anemia, easy bruising or bleeding SKIN: No rash or lesion. MUSCULOSKELETAL: No joint pain or arthritis.   NEUROLOGIC: No tingling, numbness, weakness.  PSYCHIATRY: No anxiety or depression.   MEDICATIONS AT HOME:   Prior to Admission medications   Medication Sig Start Date End Date Taking? Authorizing Provider  allopurinol (ZYLOPRIM) 300 MG tablet Take 300 mg by mouth daily.   Yes Historical Provider, MD  atenolol (TENORMIN) 50 MG tablet Take 50 mg by mouth daily.   Yes Historical Provider, MD  albuterol (PROVENTIL HFA;VENTOLIN HFA) 108 (90 BASE) MCG/ACT inhaler Inhale 2 puffs into the lungs every 6 (six) hours as needed for wheezing or shortness of breath.    Historical Provider, MD  colchicine 0.6 MG tablet Take 0.6 mg by mouth daily.    Historical Provider, MD  digoxin (LANOXIN) 0.25 MG tablet Take 0.25 mg by mouth daily.    Historical Provider, MD  ethambutol (MYAMBUTOL) 400 MG tablet Take 1,600 mg by mouth daily.    Historical Provider, MD  Fluticasone Furoate-Vilanterol 100-25 MCG/INH AEPB  Inhale 1 puff into the lungs daily.     Historical Provider, MD  furosemide (LASIX) 40 MG tablet Take 40 mg by mouth daily.    Historical Provider, MD  HYDROcodone-homatropine (HYCODAN) 5-1.5 MG/5ML syrup Take 5 mLs by mouth every 6 (six) hours as needed for cough.    Historical Provider, MD  isoniazid (NYDRAZID) 300 MG tablet Take 300 mg by mouth daily.    Historical Provider, MD  losartan (COZAAR) 50 MG tablet Take 50 mg by mouth daily.    Historical Provider, MD  lovastatin (MEVACOR) 20 MG tablet Take 20 mg by mouth daily.    Historical Provider, MD  metoprolol  (LOPRESSOR) 50 MG tablet Take 50 mg by mouth 2 (two) times daily.    Historical Provider, MD  pyrazinamide 500 MG tablet Take 2,000 mg by mouth daily.    Historical Provider, MD  rifampin (RIFADIN) 300 MG capsule Take 600 mg by mouth daily.    Historical Provider, MD  vitamin B-6 (PYRIDOXINE) 25 MG tablet Take 25 mg by mouth daily.    Historical Provider, MD  warfarin (COUMADIN) 10 MG tablet Take 10 mg by mouth daily. Pt takes 10 mg on Saturday and Sunday.    Historical Provider, MD  warfarin (COUMADIN) 4 MG tablet Take 8 mg by mouth daily. Pt takes 8 mg on Monday-Friday.    Historical Provider, MD      VITAL SIGNS:  Blood pressure 118/67, pulse 114, temperature 97.8 F (36.6 C), temperature source Oral, resp. rate 35, SpO2 100 %.  PHYSICAL EXAMINATION:  GENERAL:  54 y.o.-year-old patient lying in the bed with no acute distress.  EYES: Pupils equal, round, reactive to light and accommodation. No scleral icterus. Extraocular muscles intact.  HEENT: Head atraumatic, normocephalic. Oropharynx and nasopharynx clear.  NECK:  Supple, no jugular venous distention. No thyroid enlargement, no tenderness.  LUNGS: Coarse breath sounds with moderate air entry bilaterally, diffuse wheezing, rales,rhonchi , no crepitation. No use of accessory muscles of respiration. On BiPAP CARDIOVASCULAR: S1, S2 normal. No murmurs, rubs, or gallops.  ABDOMEN: Soft, nontender, nondistended. Bowel sounds present. No organomegaly or mass.  EXTREMITIES: No pedal edema, cyanosis, or clubbing.  NEUROLOGIC: Cranial nerves II through XII are intact. Muscle strength 5/5 in all extremities. Sensation intact. Gait not checked.  PSYCHIATRIC: The patient is alert and oriented x 3.  SKIN: No obvious rash, lesion, or ulcer.   LABORATORY PANEL:   CBC  Recent Labs Lab 08/17/15 1204  WBC 9.1  HGB 16.0  HCT 51.0  PLT 159    ------------------------------------------------------------------------------------------------------------------  Chemistries   Recent Labs Lab 08/17/15 1204  NA 138  K 3.8  CL 93*  CO2 32  GLUCOSE 132*  BUN 48*  CREATININE 1.56*  CALCIUM 9.0  AST 35  ALT 25  ALKPHOS 61  BILITOT 3.1*   ------------------------------------------------------------------------------------------------------------------  Cardiac Enzymes  Recent Labs Lab 08/17/15 1204  TROPONINI 0.03   ------------------------------------------------------------------------------------------------------------------  RADIOLOGY:  Dg Chest Portable 1 View  08/17/2015  CLINICAL DATA:  Shortness of breath, COPD, CHF EXAM: PORTABLE CHEST 1 VIEW COMPARISON:  02/28/2015 FINDINGS: Limited exam because of portable technique and body habitus. Cardiomegaly evident with diffuse increased interstitial type opacities concerning for edema. Early CHF is favored. Low lung volumes with basilar atelectasis. No large effusion or pneumothorax. Trachea midline. IMPRESSION: Cardiomegaly with mild early interstitial edema pattern. Electronically Signed   By: Judie Petit.  Shick M.D.   On: 08/17/2015 12:26    EKG:   Orders placed or performed  during the hospital encounter of 08/17/15  . ED EKG  . ED EKG    IMPRESSION AND PLAN:  Marta AntuKelvin Rager  is a 54 y.o. male with a known history of chronic COPD, lives on 5 L of oxygen at home, chronic congestive heart failure, obstructive sleep apnea, chronic atrial fibrillation currently on Coumadin and multiple other medical problems is present into the ED with a chief complaint of worsening of shortness of breath. As per EMS patient was on 8 L of oxygen via nasal cannula. Patient was placed on Ventimask with FiO2 50% and his SPO2 was 65% respiratory rate was 40 and heart rate was 120. Patient is brought into the ED he was placed on BiPAP. Solum Medrol 125 mg IV was given and Lasix 40 mg IV was given  as chest x-ray has revealed mild interstitial edema. Hospitalist team is called to admit the patient  1. Acute on chronic hypoxic respiratory failure secondary to acute exacerbation of CHF and COPD exacerbation  Patient is on BiPAP at 70% FiO2-admitted to intensive care unit Patient lives on 5 L of home oxygen Pulmonology consult is placed to Dr. Meredeth IdeFleming   2. Acute exacerbation of CHF We will give him Lasix 40 mg IV every 12 hours Continue BiPAP Cardiac consult is placed to Dr. Evette GeorgesParashos. Patient reports that he had echocardiogram done recently, will get report from Dr. Evette GeorgesParashos Monitor intake and output Monitor daily weights Continue patient's home medication aspirin and statin. Continue beta blocker atenolol Hold Cozaar in view of acute kidney injury Cycle cardiac biomarkers  3. Acute exacerbation of COPD. Continue BiPAP and wean off as tolerated. Pulmonology consult is placed to Dr. Meredeth IdeFleming.  Patient will be on Solu-Medrol 60 mg IV every 6 hours. Provide DuoNeb neb treatments and albuterol as needed basis Prophylactic antibiotic IV levofloxacin  4. Chronic atrial fibrillation Patient is on Coumadin, will check PT/INR and Coumadin management per pharmacy Patient is reporting that Dr. Evette GeorgesParashos was is planning to switch him to oral pradaxa Currently rate controlled. Continue Cardizem, his home medication  5. Acute kidney injury Could be from by mouth Lasix and ACE inhibitor Avoid nephrotoxins and hold Cozaar at this time. Monitor renal function closely. Currently patient is on Lasix IV which might worsen the renal function. If needed will consider nephrology consult.  6. Chronic history of obstructive sleep apnea Patient might have underlying pulmonary hypertension We will follow up with pulmonology  7. Hyperlipidemia Continue statin  8. Recent history of tuberculosis Patient reports that he has finished TB treatment with 3 medications for 6 months including isoniazid  rifampicin and PZN Denies any cough or unintentional weight loss   DVT prophylaxis with Coumadin based on his INR results  All the records are reviewed and case discussed with ED provider. Management plans discussed with the patient, family and they are in agreement.  CODE STATUS: FC,   TOTAL CRITICAL CARE TIME TAKING CARE OF THIS PATIENT: 50  minutes.    Ramonita LabGouru, Shanty Ginty M.D on 08/17/2015 at 2:12 PM  Between 7am to 6pm - Pager - [phone]  After 6pm go to [email or web address] - password [email or web address] [facility]  [facility]  Office  [phone]  CC: Primary care physician; Leotis ShamesSingh,Jasmine, MD

## 2015-08-17 NOTE — ED Provider Notes (Signed)
[facility] Emergency Department Provider Note  ____________________________________________  Time seen: Approximately 12:01 PM  I have reviewed the triage vital signs and the nursing notes.   HISTORY  Chief Complaint Shortness of Breath  Caveat-history of present illness and review of systems limited second to the patient's severe respiratory distress.  HPI Jonathan PaceKelvin D [name] Sr. is a 54 y.o. male with chronic COPD with chronic 5 L home oxygen requirement, chronic congestive heart failure, obstructive sleep apnea, chronic atrial fibrillation currently on Coumadin who presents for evaluation of 2-3 days gradual onset worsening shortness of breath, constant since onset. He has also noted that he is been wheezing. He is complaining of some chest tightness as well. On EMS arrival, he was hypoxic, placed on nonrebreather and was satting 73%. He seemed to improve when they gave him 2 DuoNeb treatments. No other modifying factors. No vomiting, diarrhea, fevers or chills. He has had mild cough.   Past Medical History  Diagnosis Date  . Atrial fibrillation (HCC)   . Hypertension   . Tuberculosis   . Sleep apnea   . COPD (chronic obstructive pulmonary disease) (HCC)     on 5l home oxygen  . Obesity   . Gout   . Hyperlipidemia     Patient Active Problem List   Diagnosis Date Noted  . Acute respiratory failure (HCC) 08/17/2015  . Dyspnea 02/28/2015  . A-fib (HCC) 02/28/2015    Past Surgical History  Procedure Laterality Date  . Bronchoscopy      No current outpatient prescriptions on file.  Allergies Review of patient's allergies indicates no known allergies.  Family History  Problem Relation Age of Onset  . Cancer Mother   . Emphysema Father     Social History Social History  Substance Use Topics  . Smoking status: Never Smoker   . Smokeless tobacco: None  . Alcohol Use: Yes    Review of Systems Constitutional: No fever/chills Eyes: No  visual changes. ENT: No sore throat. Cardiovascular: + chest pain. Respiratory: +shortness of breath. Gastrointestinal: No abdominal pain.  No nausea, no vomiting.  No diarrhea.  No constipation. Genitourinary: Negative for dysuria. Musculoskeletal: Negative for back pain. Skin: Negative for rash. Neurological: Negative for headaches, focal weakness or numbness.  10-point ROS otherwise negative.  ____________________________________________   PHYSICAL EXAM:  VITAL SIGNS: ED Triage Vitals  Enc Vitals Group     BP 08/17/15 1154 123/87 mmHg     Pulse Rate 08/17/15 1154 108     Resp --      Temp 08/17/15 1154 97.8 F (36.6 C)     Temp Source 08/17/15 1154 Oral     SpO2 08/17/15 1154 78 %     Weight --      Height --      Head Cir --      Peak Flow --      Pain Score --      Pain Loc --      Pain Edu? --      Excl. in GC? --     Constitutional: Alert and oriented. In moderate respiratory distress. Speaking only a few words at a time. Eyes: Conjunctivae are normal. PERRL. EOMI. Head: Atraumatic. Nose: No congestion/rhinnorhea. Mouth/Throat: Mucous membranes are moist.  Oropharynx non-erythematous. Neck: No stridor.   Cardiovascular: tachycardic rate, irregular rhythm. Grossly normal heart sounds.  Good peripheral circulation.  Respiratory: Diffuse wheezing with rales, increased work of breathing, tachypnea. Gastrointestinal: Soft and nontender. No distention. No abdominal  bruits. No CVA tenderness. Genitourinary: deferred Musculoskeletal: 3+ pitting edema bilateral lower extremities. Neurologic:  Normal speech and language. No gross focal neurologic deficits are appreciated. No gait instability. Skin:  Skin is warm, dry and intact. No rash noted. Psychiatric: Mood and affect are normal. Speech and behavior are normal.  ____________________________________________   LABS (all labs ordered are listed, but only abnormal results are displayed)  Labs Reviewed  CBC WITH  DIFFERENTIAL/PLATELET - Abnormal; Notable for the following:    MCHC 31.4 (*)    RDW 18.4 (*)    Neutro Abs 7.9 (*)    Lymphs Abs 0.2 (*)    All other components within normal limits  COMPREHENSIVE METABOLIC PANEL - Abnormal; Notable for the following:    Chloride 93 (*)    Glucose, Bld 132 (*)    BUN 48 (*)    Creatinine, Ser 1.56 (*)    Total Bilirubin 3.1 (*)    GFR calc non Af Amer 48 (*)    GFR calc Af Amer 56 (*)    All other components within normal limits  BRAIN NATRIURETIC PEPTIDE - Abnormal; Notable for the following:    B Natriuretic Peptide 669.0 (*)    All other components within normal limits  BLOOD GAS, ARTERIAL - Abnormal; Notable for the following:    pCO2 arterial 53 (*)    Bicarbonate 35.2 (*)    Acid-Base Excess 9.1 (*)    All other components within normal limits  CULTURE, BLOOD (ROUTINE X 2)  CULTURE, BLOOD (ROUTINE X 2)  TROPONIN I  PROTIME-INR  TROPONIN I  TROPONIN I  TROPONIN I   ____________________________________________  EKG  ED ECG REPORT I, Jonathan [name], the attending physician, personally viewed and interpreted this ECG.   Date: 08/17/2015  EKG Time: 11:52  Rate: 114  Rhythm: atrial fibrillation, rate 114  Axis: normal  Intervals:none  ST&T Change: No acute ST elevation.  ____________________________________________  RADIOLOGY  CXR  IMPRESSION: Cardiomegaly with mild early interstitial edema pattern.  ____________________________________________   PROCEDURES  Procedure(s) performed: None  Critical Care performed: Yes, see critical care note(s). Total critical care time spent 30 minutes.  ____________________________________________   INITIAL IMPRESSION / ASSESSMENT AND PLAN / ED COURSE  Pertinent labs & imaging results that were available during my care of the patient were reviewed by me and considered in my medical decision making (see chart for details).   Jonathan [name] Sr. is a 54 y.o. male with chronic  COPD with chronic 5 L home oxygen requirement, chronic congestive heart failure, obstructive sleep apnea, chronic atrial fibrillation currently on Coumadin who presents for evaluation of 2-3 days gradual onset worsening shortness of breath, constant since onset. On arrival to the emergency department, hypoxic despite nonrebreather with severely increased work of breathing which has improved with BiPAP. His diffuse wheezing and rales and does appear grossly volume overloaded. Suspect combined CHF or COPD exacerbation. We'll give DuoNeb treatment, slight Medrol, continue BiPAP. Awaiting chest x-ray, BNP as well as basic labs, anticipate admission.   ----------------------------------------- 1:37 PM on 08/17/2015 -----------------------------------------  Patient appears much more comfortable BiPAP at this time. Tachycardia resolved. O2 saturation 100% on BiPAP. Labs reviewed. Normal CBC. CMP shows mild creatinine elevation of 1.56. Troponin negative. BNP is elevated at 669 and chest x-ray shows interstitial edema. Suspect CHF exacerbation however blood pressure limits use of nitroglycerin at this time. We'll give Lasix. Case discussed with hospitalist, Dr. Amado Coe, for admission at this time. ____________________________________________   FINAL  CLINICAL IMPRESSION(S) / ED DIAGNOSES  Final diagnoses:  Congestive heart failure, unspecified congestive heart failure chronicity, unspecified congestive heart failure type (HCC)  COPD exacerbation (HCC)  Acute respiratory failure, unspecified whether with hypoxia or hypercapnia (HCC)      Jonathan Doss, MD 08/17/15 1456

## 2015-08-17 NOTE — Therapy (Signed)
Called to patient room due to respiratory distress. Patient found on [place] at 8 lpm from EMS. Placed on Venti-mask at FiO2 .50, SpO2 65%, RR 40, HR 120. Patient placed on Bipap at ordered settings. RR decreased to 35, SpO2 increased to 98%, patient stated he was breathing easier. Neb tx given inline.

## 2015-08-17 NOTE — Progress Notes (Signed)
ANTIBIOTIC CONSULT NOTE - INITIAL  Pharmacy Consult for Levaquin Indication: AECOPD  No Known Allergies  Patient Measurements: Height: 5\' 11"  (180.3 cm) Weight: (!) 321 lb 14 oz (146 kg) IBW/kg (Calculated) : 75.3  Vital Signs: Temp: 97.5 F (36.4 C) (10/26 1500) Temp Source: Axillary (10/26 1500) BP: 104/79 mmHg (10/26 1500) Pulse Rate: 85 (10/26 1500) Intake/Output from previous day:   Intake/Output from this shift: Total I/O In: 3 [I.V.:3] Out: -   Labs:  Recent Labs  08/17/15 1204  WBC 9.1  HGB 16.0  PLT 159  CREATININE 1.56*   Estimated Creatinine Clearance: 77.5 mL/min (by C-G formula based on Cr of 1.56). No results for input(s): VANCOTROUGH, VANCOPEAK, VANCORANDOM, GENTTROUGH, GENTPEAK, GENTRANDOM, TOBRATROUGH, TOBRAPEAK, TOBRARND, AMIKACINPEAK, AMIKACINTROU, AMIKACIN in the last 72 hours.   Microbiology: No results found for this or any previous visit (from the past 720 hour(s)).  Medical History: Past Medical History  Diagnosis Date  . Atrial fibrillation (HCC)   . Hypertension   . Tuberculosis   . Sleep apnea   . COPD (chronic obstructive pulmonary disease) (HCC)     on 5l home oxygen  . Obesity   . Gout   . Hyperlipidemia     Medications:  Scheduled:  . atenolol  50 mg Oral Daily  . diltiazem  240 mg Oral Daily  . furosemide  40 mg Intravenous BID  . ipratropium-albuterol  3 mL Nebulization Q6H  . levofloxacin (LEVAQUIN) IV  500 mg Intravenous Q24H  . methylPREDNISolone (SOLU-MEDROL) injection  60 mg Intravenous Q6H  . mometasone-formoterol  2 puff Inhalation BID  . pravastatin  20 mg Oral q1800  . senna  1 tablet Oral BID  . sodium chloride  3 mL Intravenous Q12H  . sodium chloride  3 mL Intravenous Q12H   Infusions:    Assessment: 54 y/o M admitted with acute respiratory failure due to CHF and COPD exacerbatiion ordered empiric Levaquin.   Plan:  Will order Levaquin 500 mg iv q 24 hours. Will f/u renal function.   Luisa Harthristy,  Kimyah Frein D 08/17/2015,3:11 PM

## 2015-08-17 NOTE — Progress Notes (Signed)
Pt wanted a break from the Bipap. Pt is on 6l [place], SPO2 93%. RN aware and will get pt popsicle.

## 2015-08-18 DIAGNOSIS — J449 Chronic obstructive pulmonary disease, unspecified: Secondary | ICD-10-CM

## 2015-08-18 DIAGNOSIS — I10 Essential (primary) hypertension: Secondary | ICD-10-CM

## 2015-08-18 DIAGNOSIS — Z7901 Long term (current) use of anticoagulants: Secondary | ICD-10-CM

## 2015-08-18 DIAGNOSIS — I482 Chronic atrial fibrillation: Secondary | ICD-10-CM

## 2015-08-18 DIAGNOSIS — A159 Respiratory tuberculosis unspecified: Secondary | ICD-10-CM

## 2015-08-18 LAB — MRSA PCR SCREENING: MRSA by PCR: NEGATIVE

## 2015-08-18 LAB — COMPREHENSIVE METABOLIC PANEL
ALT: 22 U/L (ref 17–63)
AST: 27 U/L (ref 15–41)
Albumin: 3.2 g/dL — ABNORMAL LOW (ref 3.5–5.0)
Alkaline Phosphatase: 52 U/L (ref 38–126)
Anion gap: 12 (ref 5–15)
BILIRUBIN TOTAL: 2.1 mg/dL — AB (ref 0.3–1.2)
BUN: 48 mg/dL — AB (ref 6–20)
CO2: 31 mmol/L (ref 22–32)
CREATININE: 1.54 mg/dL — AB (ref 0.61–1.24)
Calcium: 8.8 mg/dL — ABNORMAL LOW (ref 8.9–10.3)
Chloride: 94 mmol/L — ABNORMAL LOW (ref 101–111)
GFR, EST AFRICAN AMERICAN: 57 mL/min — AB (ref >60)
GFR, EST NON AFRICAN AMERICAN: 49 mL/min — AB (ref >60)
Glucose, Bld: 124 mg/dL — ABNORMAL HIGH (ref 65–99)
Potassium: 4.3 mmol/L (ref 3.5–5.1)
Sodium: 137 mmol/L (ref 135–145)
TOTAL PROTEIN: 6.4 g/dL — AB (ref 6.5–8.1)

## 2015-08-18 LAB — BLOOD GAS, ARTERIAL
ALLENS TEST (PASS/FAIL): POSITIVE — AB
Acid-Base Excess: 8 mmol/L — ABNORMAL HIGH (ref 0.0–3.0)
Bicarbonate: 32.8 mEq/L — ABNORMAL HIGH (ref 21.0–28.0)
FIO2: 0.48
O2 SAT: 90.2 %
PCO2 ART: 45 mmHg (ref 32.0–48.0)
PH ART: 7.47 — AB (ref 7.350–7.450)
PO2 ART: 55 mmHg — AB (ref 83.0–108.0)
Patient temperature: 37

## 2015-08-18 LAB — CBC
HCT: 46.2 % (ref 40.0–52.0)
Hemoglobin: 14.9 g/dL (ref 13.0–18.0)
MCH: 27.7 pg (ref 26.0–34.0)
MCHC: 32.2 g/dL (ref 32.0–36.0)
MCV: 86.1 fL (ref 80.0–100.0)
Platelets: 147 10*3/uL — ABNORMAL LOW (ref 150–440)
RBC: 5.37 MIL/uL (ref 4.40–5.90)
RDW: 18 % — AB (ref 11.5–14.5)
WBC: 5.9 10*3/uL (ref 3.8–10.6)

## 2015-08-18 LAB — PROTIME-INR
INR: 2.09
PROTHROMBIN TIME: 23.6 s — AB (ref 11.4–15.0)

## 2015-08-18 LAB — LIPID PANEL
CHOLESTEROL: 149 mg/dL (ref 0–200)
HDL: 38 mg/dL — ABNORMAL LOW (ref >40)
LDL Cholesterol: 97 mg/dL (ref 0–99)
TRIGLYCERIDES: 71 mg/dL (ref <150)
Total CHOL/HDL Ratio: 3.9 RATIO
VLDL: 14 mg/dL (ref 0–40)

## 2015-08-18 LAB — TYPE AND SCREEN
ABO/RH(D): O POS
ANTIBODY SCREEN: NEGATIVE

## 2015-08-18 LAB — ABO/RH: ABO/RH(D): O POS

## 2015-08-18 LAB — TSH: TSH: 0.709 u[IU]/mL (ref 0.350–4.500)

## 2015-08-18 MED ORDER — SODIUM CHLORIDE 0.9 % IV SOLN
Freq: Once | INTRAVENOUS | Status: AC
  Administered 2015-08-18: 12:00:00 via INTRAVENOUS

## 2015-08-18 MED ORDER — VITAMIN K1 10 MG/ML IJ SOLN
10.0000 mg | Freq: Once | INTRAMUSCULAR | Status: AC
  Administered 2015-08-18: 10 mg via INTRAVENOUS
  Filled 2015-08-18: qty 1

## 2015-08-18 MED ORDER — PANTOPRAZOLE SODIUM 40 MG PO TBEC
40.0000 mg | DELAYED_RELEASE_TABLET | Freq: Every day | ORAL | Status: DC
  Administered 2015-08-18 – 2015-08-26 (×9): 40 mg via ORAL
  Filled 2015-08-18 (×9): qty 1

## 2015-08-18 MED ORDER — ALLOPURINOL 300 MG PO TABS
300.0000 mg | ORAL_TABLET | Freq: Every day | ORAL | Status: DC
  Administered 2015-08-19 – 2015-08-26 (×8): 300 mg via ORAL
  Filled 2015-08-18 (×8): qty 1

## 2015-08-18 NOTE — Care Management Note (Signed)
Case Management Note  Patient Details  Name: Jonathan PaceKelvin D Selph Sr. MRN: [id number] Date of Birth: 02/11/1961  Subjective/Objective:  Patient with an extensive pulmonary history of COPD, pulmonary fibrosis, TB ( tx completed), cavitations, hypoxia, sleep apnea, using CPAP @ home, CHF and afib.   Closely followed by Dr. Mayo AoFlemming and Dr. Darrold JunkerParaschos.  Uses O2 @ 5-8 L at home. Admitted with acute on chronic hypoxic respiratory failure secondary to acute exacerbation of CHF and COPD exacerbation.                   Action/Plan: Will follow progression.   Expected Discharge Date:                  Expected Discharge Plan:     In-House Referral:     Discharge planning Services     Post Acute Care Choice:    Choice offered to:     DME Arranged:    DME Agency:     HH Arranged:    HH Agency:     Status of Service:  In process, will continue to follow  Medicare Important Message Given:    Date Medicare IM Given:    Medicare IM give by:    Date Additional Medicare IM Given:    Additional Medicare Important Message give by:     If discussed at Long Length of Stay Meetings, dates discussed:    Additional Comments:  Jonathan MemosLisa M Derionna Salvador, RN 08/18/2015, 2:38 PM

## 2015-08-18 NOTE — Progress Notes (Signed)
Subjective: Patient is little more comfortable and less short of breath today. No complaint of chest pain, dizziness or palpitations.  No abdominal pain.  Objective: Vital signs in last 24 hours: Temp:  [97.5 F (36.4 C)-97.8 F (36.6 C)] 97.7 F (36.5 C) (10/27 1200) Pulse Rate:  [34-127] 42 (10/27 1200) Resp:  [21-39] 36 (10/27 1200) BP: (87-115)/(53-94) 94/64 mmHg (10/27 1200) SpO2:  [88 %-100 %] 100 % (10/27 1200) FiO2 (%):  [55 %-65 %] 55 % (10/27 0139) Weight:  [146 kg (321 lb 14 oz)] 146 kg (321 lb 14 oz) (10/26 1458) Weight change:  Last BM Date: 08/17/15  Intake/Output from previous day: 10/26 0701 - 10/27 0700 In: 103 [I.V.:3; IV Piggyback:100] Out: 1130 [Urine:1130] Intake/Output this shift: Total I/O In: 53 [I.V.:3; IV Piggyback:50] Out: 220 [Urine:220]  GENERAL:alert, oriented x 3, no acute distress.  Oxygen by nasal cannula in use. EYES: no pallor, no icterus, extraocular muscles are intact.  HEENT: Head atraumatic, normocephalic. Oropharynx and nasopharynx clear.  NECK: supple, mild jugular venous distention. No thyroid enlargement, no tenderness.  LUNGS: + wheezing, scattered rhonchi, bibasilar crackles. No use of accessory muscles of respiration.  CARDIOVASCULAR: irregular rhythm, S1, S2, + systolic murmur.  ABDOMEN: Soft, non-tender, normal bowel sounds, no organomegaly or mass.  EXTREMITIES: + bilateral pitting lower leg edema. No cyanosis or clubbing.  NEUROLOGIC: normal strength and tone in bilateral upper and lower extremities, sensations are grossly intact, normal speech. SKIN: No obvious rash, lesion, or ulcer.   Lab Results:  Recent Labs  08/17/15 1204 08/18/15 0553  WBC 9.1 5.9  HGB 16.0 14.9  HCT 51.0 46.2  PLT 159 147*   BMET  Recent Labs  08/17/15 1204 08/18/15 0553  NA 138 137  K 3.8 4.3  CL 93* 94*  CO2 32 31  GLUCOSE 132* 124*  BUN 48* 48*  CREATININE 1.56* 1.54*  CALCIUM 9.0 8.8*    Studies/Results: Dg Chest  Portable 1 View  08/17/2015  CLINICAL DATA:  Shortness of breath, COPD, CHF EXAM: PORTABLE CHEST 1 VIEW COMPARISON:  02/28/2015 FINDINGS: Limited exam because of portable technique and body habitus. Cardiomegaly evident with diffuse increased interstitial type opacities concerning for edema. Early CHF is favored. Low lung volumes with basilar atelectasis. No large effusion or pneumothorax. Trachea midline. IMPRESSION: Cardiomegaly with mild early interstitial edema pattern. Electronically Signed   By: Judie PetitM.  Shick M.D.   On: 08/17/2015 12:26    Medications:  Scheduled Meds: . antiseptic oral rinse  7 mL Mouth Rinse BID  . atenolol  50 mg Oral Daily  . diltiazem  240 mg Oral Daily  . furosemide  40 mg Intravenous BID  . ipratropium-albuterol  3 mL Nebulization Q6H  . levofloxacin (LEVAQUIN) IV  500 mg Intravenous Q24H  . methylPREDNISolone (SOLU-MEDROL) injection  60 mg Intravenous Q6H  . mometasone-formoterol  2 puff Inhalation BID  . pravastatin  20 mg Oral q1800  . senna  1 tablet Oral BID  . sodium chloride  3 mL Intravenous Q12H  . sodium chloride  3 mL Intravenous Q12H   Continuous Infusions:  PRN Meds:.sodium chloride, acetaminophen **OR** acetaminophen, albuterol, HYDROcodone-homatropine, ondansetron **OR** ondansetron (ZOFRAN) IV, sodium chloride, zolpidem   Assessment/Plan: 54 year old male presented with acute onset shortness of breath and hypoxia.  History of chronic atrial fibrillation and multiple pulmonary problems including pulmonary tuberculosis, advanced fibrosis, cavitations, chronic obstructive pulmonary disease, obstructive sleep apnea and hypoxia.  Patient is closely followed by Dr. Meredeth IdeFleming and Dr. Darrold JunkerParaschos. He uses  supplemental oxygen by nasal cannula at 5-8 L. Patient was started on BiPAP in the emergency room  Admitted to CCU for further management..    Acute on chronic hypoxic respiratory failure: improved with BiPAP overnight, currently receiving supplemental oxygen  by nasal cannula with adequate oxygen saturation.  Arterial blood gas has improved.  Acute exacerbation of COPD: continue 1 IV Solu-Medrol, IV levofloxacin, DuoNeb treatments and Dulera.  Acute diastolic congestive heart failure and chronic atrial fibrillation with controlled ventricular response: continue diuresis with IV Lasix, monitor input-output, use torsemide as needed. Rate is controlled with usual dose of Cardizem CD 240 mg and atenolol 50 mg daily.  Warfarin on hold for  supratherapeutic INR over 10. Echocardiogram in July, 2016 revealed normal LV systolic function with mild LVH. Moderate RV systolic dysfunction. Mild mitral regurgitation, moderate tricuspid regurgitation, trivial aortic and pulmonary regurgitation.  Coagulopathy:  INR over 10. No active bleeding. No dark stools or hematuria.   Close monitoring for at active bleeding. Vitamin K 10 mg IV given.  Transfuse 2 units of fresh frozen plasma.  Repeat PT INR post transfusion.  Hematology consultation.  Obstructive sleep apnea:  Uses CPAP.  No DVT prophylaxis in light of coagulopathy.  GI prophylaxis with Protonix.   LOS: 1 day   Gurdeep Keesey 08/18/2015, 1:10 PM

## 2015-08-18 NOTE — Progress Notes (Signed)
[facility]  SUBJECTIVE: I feel better   Filed Vitals:   08/18/15 1545 08/18/15 1600 08/18/15 1615 08/18/15 1630  BP: 96/62 90/75 76/60  98/77  Pulse: 52 104 98 89  Temp:    97.8 F (36.6 C)  TempSrc:    Oral  Resp: 21 37 31 43  Height:      Weight:      SpO2: 95% 96% 94% 94%     Intake/Output Summary (Last 24 hours) at 08/18/15 1726 Last data filed at 08/18/15 1632  Gross per 24 hour  Intake  396.5 ml  Output   1550 ml  Net -1153.5 ml      PHYSICAL EXAM  General: Well developed, well nourished, in no acute distress HEENT:  Normocephalic and atramatic Neck:  No JVD.  Lungs: Clear bilaterally to auscultation and percussion. Heart: HRRR . Normal S1 and S2 without gallops or murmurs.  Abdomen: Bowel sounds are positive, abdomen soft and non-tender  Msk:  Back normal, normal gait. Normal strength and tone for age. Extremities: No clubbing, cyanosis or edema.   Neuro: Alert and oriented X 3. Psych:  Good affect, responds appropriately   LABS: Basic Metabolic Panel:  Recent Labs  45/40/9810/26/16 1204 08/18/15 0553  NA 138 137  K 3.8 4.3  CL 93* 94*  CO2 32 31  GLUCOSE 132* 124*  BUN 48* 48*  CREATININE 1.56* 1.54*  CALCIUM 9.0 8.8*   Liver Function Tests:  Recent Labs  08/17/15 1204 08/18/15 0553  AST 35 27  ALT 25 22  ALKPHOS 61 52  BILITOT 3.1* 2.1*  PROT 7.3 6.4*  ALBUMIN 3.7 3.2*   No results for input(s): LIPASE, AMYLASE in the last 72 hours. CBC:  Recent Labs  08/17/15 1204 08/18/15 0553  WBC 9.1 5.9  NEUTROABS 7.9*  --   HGB 16.0 14.9  HCT 51.0 46.2  MCV 86.9 86.1  PLT 159 147*   Cardiac Enzymes:  Recent Labs  08/17/15 1204 08/17/15 1636 08/17/15 2058  TROPONINI 0.03 0.03 0.03   BNP: Invalid input(s): POCBNP D-Dimer: No results for input(s): DDIMER in the last 72 hours. Hemoglobin A1C: No results for input(s): HGBA1C in the last 72 hours. Fasting Lipid Panel:  Recent Labs  08/18/15 0553  CHOL 149  HDL 38*  LDLCALC  97  TRIG 71  CHOLHDL 3.9   Thyroid Function Tests:  Recent Labs  08/18/15 0553  TSH 0.709   Anemia Panel: No results for input(s): VITAMINB12, FOLATE, FERRITIN, TIBC, IRON, RETICCTPCT in the last 72 hours.  Dg Chest Portable 1 View  08/17/2015  CLINICAL DATA:  Shortness of breath, COPD, CHF EXAM: PORTABLE CHEST 1 VIEW COMPARISON:  02/28/2015 FINDINGS: Limited exam because of portable technique and body habitus. Cardiomegaly evident with diffuse increased interstitial type opacities concerning for edema. Early CHF is favored. Low lung volumes with basilar atelectasis. No large effusion or pneumothorax. Trachea midline. IMPRESSION: Cardiomegaly with mild early interstitial edema pattern. Electronically Signed   By: Judie PetitM.  Shick M.D.   On: 08/17/2015 12:26     Echo   TELEMETRY: Normal sinus rhythm:  ASSESSMENT AND PLAN:  Active Problems:   Acute respiratory failure (HCC)    1. Respiratory failure, multifactorial, secondary to COPD exacerbation and acute on chronic diastolic congestive heart failure. Patient has demonstrated medical improvement with BiPAP treatment overnight, intravenous steroids, and diuresis. 2. Paroxysmal atrial fibrillation/atrial flutter,supratherapeutic on warfarin with INR greater than 10  Recommendations  1. Continue diuresis with caution 2. Follow renal status  closely 3. Hold warfarin, when subtherapeutic, restart Joesphine Bare, MD, PhD, [facility] 08/18/2015 5:26 PM

## 2015-08-18 NOTE — Progress Notes (Signed)
ANTICOAGULATION CONSULT NOTE - Initial Consult  Pharmacy Consult for warfarin Indication: atrial fibrillation  No Known Allergies  Patient Measurements: Height: 5\' 11"  (180.3 cm) Weight: (!) 321 lb 14 oz (146 kg) IBW/kg (Calculated) : 75.3  Vital Signs: Temp: 97.7 F (36.5 C) (10/27 1200) Temp Source: Oral (10/27 1200) BP: 94/64 mmHg (10/27 1200) Pulse Rate: 42 (10/27 1200)  Labs:  Recent Labs  08/17/15 1204 08/17/15 1636 08/17/15 2058 08/18/15 0553  HGB 16.0  --   --  14.9  HCT 51.0  --   --  46.2  PLT 159  --   --  147*  LABPROT >90.0*  --   --  >90.0*  INR >10.00*  --   --  >10.00*  CREATININE 1.56*  --   --  1.54*  TROPONINI 0.03 0.03 0.03  --     Estimated Creatinine Clearance: 78.5 mL/min (by C-G formula based on Cr of 1.54).   Medical History: Past Medical History  Diagnosis Date  . Atrial fibrillation (HCC)   . Hypertension   . Tuberculosis   . Sleep apnea   . COPD (chronic obstructive pulmonary disease) (HCC)     on 5l home oxygen  . Obesity   . Gout   . Hyperlipidemia    Assessment: Pharmacy consulted to dose warfarin for this 54 year old male with a history of paroxysmal atrial fibrillation who was taking warfarin 12 mg PO daily prior to admission. Patient's INR extremely elevated on presentation to the ED with an INR of >10. Vitamin K was ordered.   Per MD notes, patient may be transitioned from warfarin to dabigatran once INR is subtherapeutic.   Goal of Therapy:  INR 2-3 Monitor platelets by anticoagulation protocol: Yes   Plan:  Will continue to hold warfarin and f/u am inr.   Luisa Harthristy, Zephaniah Lubrano D 08/18/2015,1:05 PM

## 2015-08-18 NOTE — Progress Notes (Signed)
Initial appointment was made at the [facility] on September 02, 2015 at 1:00pm. Thank you.

## 2015-08-18 NOTE — Progress Notes (Signed)
Initial Nutrition Assessment      INTERVENTION:  Meals and snacks: Cater to pt preferences Nutrition diet education: Discussed low sodium diet with pt, especially ways to flavor foods with Mrs. Dash seasoning and other sodium free alternatives.  Pt verbalized understanding, expect fair compliance   NUTRITION DIAGNOSIS:   Food and nutrition related knowledge deficit related to chronic illness as evidenced by  (pt with questions regarding sodium intake).    GOAL:   Patient will meet greater than or equal to 90% of their needs    MONITOR:    (Energy intake, Electrolyte and renal profile)  REASON FOR ASSESSMENT:   Malnutrition Screening Tool    ASSESSMENT:      Pt admitted with CHF and COPD exacerbation  Past Medical History  Diagnosis Date  . Atrial fibrillation (HCC)   . Hypertension   . Tuberculosis   . Sleep apnea   . COPD (chronic obstructive pulmonary disease) (HCC)     on 5l home oxygen  . Obesity   . Gout   . Hyperlipidemia     Current Nutrition: NPO eariler this am, no tray received yet  Food/Nutrition-Related History: Pt reports good appetite up until 1 day prior to admission   Scheduled Medications:  . antiseptic oral rinse  7 mL Mouth Rinse BID  . atenolol  50 mg Oral Daily  . diltiazem  240 mg Oral Daily  . furosemide  40 mg Intravenous BID  . ipratropium-albuterol  3 mL Nebulization Q6H  . levofloxacin (LEVAQUIN) IV  500 mg Intravenous Q24H  . methylPREDNISolone (SOLU-MEDROL) injection  60 mg Intravenous Q6H  . mometasone-formoterol  2 puff Inhalation BID  . phytonadione (VITAMIN K) IV  10 mg Intravenous Once  . pravastatin  20 mg Oral q1800  . senna  1 tablet Oral BID  . sodium chloride  3 mL Intravenous Q12H  . sodium chloride  3 mL Intravenous Q12H       Electrolyte/Renal Profile and Glucose Profile:   Recent Labs Lab 08/17/15 1204 08/18/15 0553  NA 138 137  K 3.8 4.3  CL 93* 94*  CO2 32 31  BUN 48* 48*  CREATININE 1.56*  1.54*  CALCIUM 9.0 8.8*  GLUCOSE 132* 124*   Protein Profile:  Recent Labs Lab 08/17/15 1204 08/18/15 0553  ALBUMIN 3.7 3.2*    Gastrointestinal Profile: Last BM:10/26   Nutrition-Focused Physical Exam Findings:  Unable to complete Nutrition-Focused physical exam at this time.     Weight Change: pt reports fluid wt loss prior to admission  5% weight loss in the past 5 months noted per wt encounters, likely fluid related   Diet Order:  Diet Heart Room service appropriate?: Yes; Fluid consistency:: Thin  Skin:   reviewed   Height:   Ht Readings from Last 1 Encounters:  08/17/15 5\' 11"  (1.803 m)    Weight:   Wt Readings from Last 1 Encounters:  08/17/15 321 lb 14 oz (146 kg)     BMI:  Body mass index is 44.91 kg/(m^2).  Estimated Nutritional Needs:   Kcal:  Using IBW of 78kg BEE 1632 kcals (IF 1.0-1.2, AF 1.3) 2121-2545 kcals/d  Protein:  Using IBW of 78kg (1.0-1.2 g/kg) 78-94 g/d   Fluid:  Using IBW of 78kg (30-6935ml/kg) 2340-27765ml/d  EDUCATION NEEDS:   Education needs addressed  MODERATE Care Level  Cashmere Dingley B. Freida BusmanAllen, RD, LDN [phone] (pager)

## 2015-08-18 NOTE — Progress Notes (Signed)
Verified with Richmond CampbellMichelle Dorminy, RN, [facility] TB coordinator that pt has completed treatment and is released from any restrictions from prior TB.  Airborne isolation is not required.  Jason FilaSara Daughtridge Kovacic, RN, MSN, [facility]IC 08/18/15  2:39PM

## 2015-08-18 NOTE — Progress Notes (Signed)
Date: 08/18/2015,   MRN# [id number] Jonathan [name]. 04/20/1961 Code Status:     Code Status Orders        Start     Ordered   08/17/15 1455  Full code   Continuous     08/17/15 1454     Hosp day:@LENGTHOFSTAYDAYS @ Referring MD: @ATDPROV @        AdmissionWeight: (!) 321 lb 14 oz (146 kg)                 CurrentWeight: (!) 321 lb 14 oz (146 kg)  CC: shortness of breath  HPI: This is a 54 year old african, male, well known to me, who has completed TB treatment, pulmonary fibrosis, copd, obstructive sleep apnea (ON BIPAP), afib and chf came in with shortness of breath, increase hr, RR  and low sats (60's). He was on high fio2 and bipap x several hours. On work up noted to be in CHF and possible copd exacerbation, He has been treated as such, much better this pm. Off BIPAP. No chest pain, no calf pain, leg edema present. No pleurisy, hemoptysis, rare wheezing.   He said he was not watching his salt intake.     PMHX:   Past Medical History  Diagnosis Date  . Atrial fibrillation (HCC)   . Hypertension   . Tuberculosis   . Sleep apnea   . COPD (chronic obstructive pulmonary disease) (HCC)     on 5l home oxygen  . Obesity   . Gout   . Hyperlipidemia    Surgical Hx:  Past Surgical History  Procedure Laterality Date  . Bronchoscopy     Family Hx:  Family History  Problem Relation Age of Onset  . Cancer Mother   . Emphysema Father    Social Hx:   Social History  Substance Use Topics  . Smoking status: Never Smoker   . Smokeless tobacco: None  . Alcohol Use: Yes   Medication:    Home Medication:  No current outpatient prescriptions on file.  Current Medication: @CURMEDTAB @   Allergies:  Review of patient's allergies indicates no known allergies.  Review of Systems: Gen:  Denies  fever, sweats, chills HEENT: Denies blurred vision, double vision, ear pain, eye pain, hearing loss, nose bleeds, sore throat Cvc:  No dizziness, chest pain or heaviness Resp:  Less  wheezing, sob, no pleurisy, no blood  Gi: Denies swallowing difficulty, stomach pain, nausea or vomiting, diarrhea, constipation, bowel incontinence Gu:  Denies bladder incontinence, burning urine Ext:   No Joint pain, stiffness or swelling Skin: No skin rash, easy bruising or bleeding or hives Endoc:  No polyuria, polydipsia , polyphagia or weight change Psych: No depression, insomnia or hallucinations  Other:  All other systems negative  Physical Examination:   VS: BP 98/77 mmHg  Pulse 89  Temp(Src) 97.8 F (36.6 C) (Oral)  Resp 43  Ht 5\' 11"  (1.803 m)  Wt 321 lb 14 oz (146 kg)  BMI 44.91 kg/m2  SpO2 94%  General Appearance: No distress, on nasal o2 at 5 liters, large male speaking in full sentences  Neuro/psych: without focal findings, mental status, speech normal, alert and oriented, cranial nerves 2-12 intact, reflexes normal and symmetric, sensation grossly normal  HEENT: PERRLA, EOM intact, no ptosis, no other lesions noticed Neck: Short, no stridor trachea mid line Pulmonary:.Rare wheezing, distant crackles  Cardiovascular:  Normal S1,S2.  irreg.  .    Abdomen:Benign, Soft, non-tender, No masses, hepatosplenomegaly, No lymphadenopathy Endoc: No  evident thyromegaly, no signs of acromegaly or Cushing features Skin:   warm, no rashes, no ecchymosis  Extremities: normal, no cyanosis, clubbing, + ve edema, warm with normal capillary refill. Other findings:   Labs results:   Recent Labs     08/17/15  1204  08/18/15  0553  HGB  16.0  14.9  HCT  51.0  46.2  MCV  86.9  86.1  WBC  9.1  5.9  BUN  48*  48*  CREATININE  1.56*  1.54*  GLUCOSE  132*  124*  CALCIUM  9.0  8.8*  INR  >10.00*  >10.00*  ,  Rad results:  IPORTABLE CHEST 1 VIEW  COMPARISON: 02/28/2015  FINDINGS: Limited exam because of portable technique and body habitus. Cardiomegaly evident with diffuse increased interstitial type opacities concerning for edema. Early CHF is favored. Low lung volumes  with basilar atelectasis. No large effusion or pneumothorax. Trachea midline.  IMPRESSION: Cardiomegaly with mild early interstitial edema pattern.   Electronically Signed  By: Judie Petit. Shick M.D.  On: 08/17/2015 12:26   Assessment and Plan: [name] is here for increase sob, low sats, in pulmonary edema (hx of diastolic dysfunction) and copd exacerabation. Do not see frank pneumonia. Since being here, he has improved.  -following cardiology recs -diuresis -wean o2 as tolerated -resume pre admit copd regimen -solumedrol 50 mg iv q 6 hrs  Obstructive sleep apnea -bipap during sleep (ok to use his system and settings) -weight loss  S/p Tb, s/p completed treatment -continue to observe      I have personally obtained a history, examined the patient, evaluated laboratory and imaging results, formulated the assessment and plan and placed orders.  The Patient requires high complexity decision making for assessment and support, frequent evaluation and titration of therapies, application of advanced monitoring technologies and extensive interpretation of multiple databases.   Jonathan [name],M.D. Pulmonary & Critical care Medicine [facility]

## 2015-08-18 NOTE — Progress Notes (Signed)
ANTIBIOTIC CONSULT NOTE - INITIAL  Pharmacy Consult for Levaquin Indication: AECOPD  No Known Allergies  Patient Measurements: Height: 5\' 11"  (180.3 cm) Weight: (!) 321 lb 14 oz (146 kg) IBW/kg (Calculated) : 75.3  Vital Signs: Temp: 97.7 F (36.5 C) (10/27 1200) Temp Source: Oral (10/27 1200) BP: 94/64 mmHg (10/27 1200) Pulse Rate: 42 (10/27 1200) Intake/Output from previous day: 10/26 0701 - 10/27 0700 In: 103 [I.V.:3; IV Piggyback:100] Out: 1130 [Urine:1130] Intake/Output from this shift: Total I/O In: 53 [I.V.:3; IV Piggyback:50] Out: 220 [Urine:220]  Labs:  Recent Labs  08/17/15 1204 08/18/15 0553  WBC 9.1 5.9  HGB 16.0 14.9  PLT 159 147*  CREATININE 1.56* 1.54*   Estimated Creatinine Clearance: 78.5 mL/min (by C-G formula based on Cr of 1.54). No results for input(s): VANCOTROUGH, VANCOPEAK, VANCORANDOM, GENTTROUGH, GENTPEAK, GENTRANDOM, TOBRATROUGH, TOBRAPEAK, TOBRARND, AMIKACINPEAK, AMIKACINTROU, AMIKACIN in the last 72 hours.   Microbiology: Recent Results (from the past 720 hour(s))  MRSA PCR Screening     Status: None   Collection Time: 08/17/15  5:30 PM  Result Value Ref Range Status   MRSA by PCR NEGATIVE NEGATIVE Final    Comment:        The GeneXpert MRSA Assay (FDA approved for NASAL specimens only), is one component of a comprehensive MRSA colonization surveillance program. It is not intended to diagnose MRSA infection nor to guide or monitor treatment for MRSA infections.     Medical History: Past Medical History  Diagnosis Date  . Atrial fibrillation (HCC)   . Hypertension   . Tuberculosis   . Sleep apnea   . COPD (chronic obstructive pulmonary disease) (HCC)     on 5l home oxygen  . Obesity   . Gout   . Hyperlipidemia     Medications:  Scheduled:  . antiseptic oral rinse  7 mL Mouth Rinse BID  . atenolol  50 mg Oral Daily  . diltiazem  240 mg Oral Daily  . furosemide  40 mg Intravenous BID  . ipratropium-albuterol  3  mL Nebulization Q6H  . levofloxacin (LEVAQUIN) IV  500 mg Intravenous Q24H  . methylPREDNISolone (SOLU-MEDROL) injection  60 mg Intravenous Q6H  . mometasone-formoterol  2 puff Inhalation BID  . phytonadione (VITAMIN K) IV  10 mg Intravenous Once  . pravastatin  20 mg Oral q1800  . senna  1 tablet Oral BID  . sodium chloride  3 mL Intravenous Q12H  . sodium chloride  3 mL Intravenous Q12H   Infusions:    Assessment: 54 y/o M admitted with acute respiratory failure due to CHF and COPD exacerbatiion ordered empiric Levaquin.   Plan:  Will continue Levaquin 500 mg iv q 24 hours. Will f/u renal function.   Luisa Harthristy, Mahati Vajda D 08/18/2015,1:04 PM

## 2015-08-19 LAB — PREPARE FRESH FROZEN PLASMA
UNIT DIVISION: 0
Unit division: 0

## 2015-08-19 LAB — COMPREHENSIVE METABOLIC PANEL
ALK PHOS: 54 U/L (ref 38–126)
ALT: 22 U/L (ref 17–63)
AST: 28 U/L (ref 15–41)
Albumin: 3.3 g/dL — ABNORMAL LOW (ref 3.5–5.0)
Anion gap: 13 (ref 5–15)
BILIRUBIN TOTAL: 2.9 mg/dL — AB (ref 0.3–1.2)
BUN: 61 mg/dL — AB (ref 6–20)
CO2: 30 mmol/L (ref 22–32)
CREATININE: 1.77 mg/dL — AB (ref 0.61–1.24)
Calcium: 9.1 mg/dL (ref 8.9–10.3)
Chloride: 92 mmol/L — ABNORMAL LOW (ref 101–111)
GFR calc Af Amer: 48 mL/min — ABNORMAL LOW (ref >60)
GFR, EST NON AFRICAN AMERICAN: 41 mL/min — AB (ref >60)
GLUCOSE: 121 mg/dL — AB (ref 65–99)
Potassium: 4.1 mmol/L (ref 3.5–5.1)
Sodium: 135 mmol/L (ref 135–145)
TOTAL PROTEIN: 6.6 g/dL (ref 6.5–8.1)

## 2015-08-19 LAB — CBC WITH DIFFERENTIAL/PLATELET
BASOS PCT: 0 %
Basophils Absolute: 0 10*3/uL (ref 0–0.1)
Eosinophils Absolute: 0 10*3/uL (ref 0–0.7)
Eosinophils Relative: 0 %
HEMATOCRIT: 45.4 % (ref 40.0–52.0)
HEMOGLOBIN: 14.6 g/dL (ref 13.0–18.0)
LYMPHS PCT: 2 %
Lymphs Abs: 0.2 10*3/uL — ABNORMAL LOW (ref 1.0–3.6)
MCH: 27.6 pg (ref 26.0–34.0)
MCHC: 32.1 g/dL (ref 32.0–36.0)
MCV: 86.1 fL (ref 80.0–100.0)
MONO ABS: 0.5 10*3/uL (ref 0.2–1.0)
Monocytes Relative: 5 %
NEUTROS ABS: 10.3 10*3/uL — AB (ref 1.4–6.5)
NEUTROS PCT: 93 %
Platelets: 158 10*3/uL (ref 150–440)
RBC: 5.28 MIL/uL (ref 4.40–5.90)
RDW: 18 % — ABNORMAL HIGH (ref 11.5–14.5)
WBC: 11.1 10*3/uL — AB (ref 3.8–10.6)

## 2015-08-19 LAB — PROTIME-INR
INR: 1.54
PROTHROMBIN TIME: 18.7 s — AB (ref 11.4–15.0)

## 2015-08-19 LAB — BILIRUBIN, FRACTIONATED(TOT/DIR/INDIR)
Bilirubin, Direct: 0.8 mg/dL — ABNORMAL HIGH (ref 0.1–0.5)
Indirect Bilirubin: 2.2 mg/dL — ABNORMAL HIGH (ref 0.3–0.9)
Total Bilirubin: 3 mg/dL — ABNORMAL HIGH (ref 0.3–1.2)

## 2015-08-19 MED ORDER — FUROSEMIDE 10 MG/ML IJ SOLN
80.0000 mg | Freq: Two times a day (BID) | INTRAMUSCULAR | Status: DC
  Administered 2015-08-19 – 2015-08-20 (×2): 80 mg via INTRAVENOUS
  Filled 2015-08-19 (×2): qty 8

## 2015-08-19 MED ORDER — DABIGATRAN ETEXILATE MESYLATE 150 MG PO CAPS
150.0000 mg | ORAL_CAPSULE | Freq: Two times a day (BID) | ORAL | Status: DC
  Administered 2015-08-19 – 2015-08-26 (×14): 150 mg via ORAL
  Filled 2015-08-19 (×18): qty 1

## 2015-08-19 NOTE — Progress Notes (Signed)
Rt called to room by patient questioning about decrease earlier in the day of FiO2 on Bipap.  Patient has no documented sat less than 95% on Bipap.  Patient questioning why he can't be turned back up to 60% FiO2.  RT explained to patient his sat hasn't dropped enough to increase FiO2 at this time.  Patient placed himself back on Bipap to "show" RT how his sat dropped.  Lowest sat seen was 91% and within 30 seconds sat up to 94% on Bipap.  Will continue to monitor.

## 2015-08-19 NOTE — Progress Notes (Signed)
[facility]  Date of admission:  08/17/2015  Inpatient day:  08/18/2015   Consulting physician: Dr. Glendon Axe   Reason for Consultation:  Coagulopathy, INR over 10.  Chief Complaint: Jonathan Provost Sr. is a 54 y.o. male with chronic atrial fibrillation, congestive heart failure, and COPD who was admitted with shortness of breath secondary to a COPD flare.  HPI: The patient has a history of chronic atrial fibrillation.  He was initially on Pradaxa.  Because of treatment of TB, he was switched to Coumadin.  Initial Coumadin dose was 15 mg a day.  His dose has varied over time.  Most recently, his Coumadin was 12 mg 6 days a week and 10 mg once a week.  He denies any medication confusion.  INR is checked monthly.  He was to have had his INR checked on 08/16/2015, but was not performed secondary to him feeling ill.  He notes not feeling well for some time.  He notes shortness of breath.  Eating would make him feel bad,  His diet has changed over the past week or more.  He denies any new medications or herbal products.  He denies any diarrhea.  He notes an episode of prolonged epistaxis.  He denies any melena or hematochezia.  Labs on admission were notable for an INR > 10.  Hematocrit was 51, hemoglobin 16, and platelets were 159,000.  Bilirubin was 3.1.  Creatinine was 1.56.  His Coumadin has been held.  He initially received Vitamin K 1 mg SQ.  INR today was > 10 again.  He received FFP and 10 mg vitamin K IV.  Past Medical History  Diagnosis Date  . Atrial fibrillation ([place])   . Hypertension   . Tuberculosis   . Sleep apnea   . COPD (chronic obstructive pulmonary disease) (HCC)     on 5l home oxygen  . Obesity   . Gout   . Hyperlipidemia     Past Surgical History  Procedure Laterality Date  . Bronchoscopy      Family History  Problem Relation Age of Onset  . Cancer Mother   . Emphysema Father     Social History:  reports that he has never smoked. He  does not have any smokeless tobacco history on file. He reports that he drinks alcohol. He reports that he does not use illicit drugs.  He is alone today.  Allergies: No Known Allergies  Medications Prior to Admission  Medication Sig Dispense Refill  . allopurinol (ZYLOPRIM) 300 MG tablet Take 300 mg by mouth daily.    Marland Kitchen atenolol (TENORMIN) 50 MG tablet Take 50 mg by mouth daily.    . colchicine 0.6 MG tablet Take 0.6 mg by mouth 2 (two) times daily as needed (for gout flares).     Marland Kitchen diltiazem (CARDIZEM CD) 240 MG 24 hr capsule Take 240 mg by mouth daily.    . fluticasone (FLONASE) 50 MCG/ACT nasal spray Place 2 sprays into both nostrils daily.     Marland Kitchen ipratropium-albuterol (DUONEB) 0.5-2.5 (3) MG/3ML SOLN Take 3 mLs by nebulization 4 (four) times daily as needed (shortness of breath/wheezing).    Marland Kitchen lovastatin (MEVACOR) 20 MG tablet Take 20 mg by mouth at bedtime.     . torsemide (DEMADEX) 20 MG tablet Take 40 mg by mouth daily.    . vitamin B-6 (PYRIDOXINE) 25 MG tablet Take 25 mg by mouth daily.    Marland Kitchen warfarin (COUMADIN) 1 MG tablet Take 2 mg  by mouth daily. Pt takes with a 44m tablet.    . warfarin (COUMADIN) 10 MG tablet Take 10 mg by mouth daily. Pt takes with two 144mtablets.      Review of Systems: GENERAL:  Feels tired.  No fevers, sweats or weight loss. PERFORMANCE STATUS (ECOG):  1 HEENT:  Epistaxis right nares prior to admission.  No visual changes, runny nose, sore throat, mouth sores or tenderness. Lungs: Shortness of breath.  Cough.  No hemoptysis. Cardiac:  No chest pain, palpitations, orthopnea, or PND. GI:  Eating poorly.  No nausea, vomiting, diarrhea, constipation, melena or hematochezia. GU:  No urgency, frequency, dysuria, or hematuria. Musculoskeletal:  No back pain.  No joint pain.  No muscle tenderness. Extremities:  No pain or swelling. Skin:  No rashes or skin changes. Neuro:  No headache, numbness or weakness, balance or coordination issues. Endocrine:  No  diabetes, thyroid issues, hot flashes or night sweats. Psych:  No mood changes, depression or anxiety. Pain:  No focal pain. Review of systems:  All other systems reviewed and found to be negative.  Physical Exam:  Blood pressure 100/70, pulse 30, temperature 98.4 F (36.9 C), temperature source Oral, resp. rate 30, height 5' 11" (1.803 m), weight 321 lb 14 oz (146 kg), SpO2 96 %.  GENERAL:  Well developed, well nourished, sitting comfortably in the medical ICU in no acute distress. MENTAL STATUS:  Alert and oriented to person, place and time. HEAD:  Short brown hair.  Normocephalic, atraumatic, face symmetric, no Cushingoid features. EYES:  Brown eyes.  Pupils equal round and reactive to light and accomodation.  No conjunctivitis or scleral icterus. ENT:  Harpster in place.  Oropharynx clear without lesion.  Tongue normal. Mucous membranes moist.  RESPIRATORY:  Soft scattered wheezes.  No rales or rhonchi. CARDIOVASCULAR:  Regular rate and rhythm without murmur, rub or gallop. ABDOMEN:  Soft, non-tender, with active bowel sounds, and no hepatosplenomegaly.  No masses. SKIN:  No rashes, ulcers or lesions. EXTREMITIES: No edema, no skin discoloration or tenderness.  No palpable cords. LYMPH NODES: No palpable cervical, supraclavicular, axillary or inguinal adenopathy  NEUROLOGICAL: Unremarkable. PSYCH:  Appropriate.  Results for orders placed or performed during the hospital encounter of 08/17/15 (from the past 48 hour(s))  CBC with Differential     Status: Abnormal   Collection Time: 08/17/15 12:04 PM  Result Value Ref Range   WBC 9.1 3.8 - 10.6 K/uL   RBC 5.87 4.40 - 5.90 MIL/uL   Hemoglobin 16.0 13.0 - 18.0 g/dL   HCT 51.0 40.0 - 52.0 %   MCV 86.9 80.0 - 100.0 fL   MCH 27.3 26.0 - 34.0 pg   MCHC 31.4 (L) 32.0 - 36.0 g/dL   RDW 18.4 (H) 11.5 - 14.5 %   Platelets 159 150 - 440 K/uL   Neutrophils Relative % 86 %   Neutro Abs 7.9 (H) 1.4 - 6.5 K/uL   Lymphocytes Relative 2 %   Lymphs  Abs 0.2 (L) 1.0 - 3.6 K/uL   Monocytes Relative 10 %   Monocytes Absolute 0.9 0.2 - 1.0 K/uL   Eosinophils Relative 1 %   Eosinophils Absolute 0.1 0 - 0.7 K/uL   Basophils Relative 1 %   Basophils Absolute 0.0 0 - 0.1 K/uL  Comprehensive metabolic panel     Status: Abnormal   Collection Time: 08/17/15 12:04 PM  Result Value Ref Range   Sodium 138 135 - 145 mmol/L   Potassium 3.8 3.5 -  5.1 mmol/L   Chloride 93 (L) 101 - 111 mmol/L   CO2 32 22 - 32 mmol/L   Glucose, Bld 132 (H) 65 - 99 mg/dL   BUN 48 (H) 6 - 20 mg/dL   Creatinine, Ser 1.56 (H) 0.61 - 1.24 mg/dL   Calcium 9.0 8.9 - 10.3 mg/dL   Total Protein 7.3 6.5 - 8.1 g/dL   Albumin 3.7 3.5 - 5.0 g/dL   AST 35 15 - 41 U/L   ALT 25 17 - 63 U/L   Alkaline Phosphatase 61 38 - 126 U/L   Total Bilirubin 3.1 (H) 0.3 - 1.2 mg/dL   GFR calc non Af Amer 48 (L) >60 mL/min   GFR calc Af Amer 56 (L) >60 mL/min    Comment: (NOTE) The eGFR has been calculated using the CKD EPI equation. This calculation has not been validated in all clinical situations. eGFR's persistently <60 mL/min signify possible Chronic Kidney Disease.    Anion gap 13 5 - 15  Troponin I     Status: None   Collection Time: 08/17/15 12:04 PM  Result Value Ref Range   Troponin I 0.03 <0.031 ng/mL    Comment:        NO INDICATION OF MYOCARDIAL INJURY.   Brain natriuretic peptide     Status: Abnormal   Collection Time: 08/17/15 12:04 PM  Result Value Ref Range   B Natriuretic Peptide 669.0 (H) 0.0 - 100.0 pg/mL  Blood culture (routine x 2)     Status: None (Preliminary result)   Collection Time: 08/17/15 12:04 PM  Result Value Ref Range   Specimen Description BLOOD LEFT HAND    Special Requests BOTTLES DRAWN AEROBIC AND ANAEROBIC 2CC    Culture NO GROWTH 1 DAY    Report Status PENDING   Protime-INR     Status: Abnormal   Collection Time: 08/17/15 12:04 PM  Result Value Ref Range   Prothrombin Time >90.0 (H) 11.4 - 15.0 seconds    Comment: CRITICAL RESULT  CALLED TO, READ BACK BY AND VERIFIED WITH:  JAMIE SMITH GREGORY AT 1452 08/17/15 SDR RESULT REPEATED AND VERIFIED    INR >10.00 (HH)     Comment: CRITICAL RESULT CALLED TO, READ BACK BY AND VERIFIED WITH:  JAMIE SMITH GREGORY AT 1452 08/17/15 SDR RESULT REPEATED AND VERIFIED   Blood culture (routine x 2)     Status: None (Preliminary result)   Collection Time: 08/17/15 12:05 PM  Result Value Ref Range   Specimen Description BLOOD RIGHT WRIST    Special Requests BOTTLES DRAWN AEROBIC AND ANAEROBIC 3CC    Culture NO GROWTH 1 DAY    Report Status PENDING   Blood gas, arterial     Status: Abnormal   Collection Time: 08/17/15  1:55 PM  Result Value Ref Range   FIO2 0.70    Delivery systems BILEVEL POSITIVE AIRWAY PRESSURE    Inspiratory PAP 12    Expiratory PAP 6.0    pH, Arterial 7.43 7.350 - 7.450   pCO2 arterial 53 (H) 32.0 - 48.0 mmHg   pO2, Arterial 106 83.0 - 108.0 mmHg   Bicarbonate 35.2 (H) 21.0 - 28.0 mEq/L   Acid-Base Excess 9.1 (H) 0.0 - 3.0 mmol/L   O2 Saturation 98.2 %   Patient temperature 37.0    Collection site RIGHT BRACHIAL    Sample type ARTERIAL DRAW    Mechanical Rate 12   Glucose, capillary     Status: Abnormal   Collection Time: 08/17/15    2:50 PM  Result Value Ref Range   Glucose-Capillary 116 (H) 65 - 99 mg/dL  Troponin I     Status: None   Collection Time: 08/17/15  4:36 PM  Result Value Ref Range   Troponin I 0.03 <0.031 ng/mL    Comment:        NO INDICATION OF MYOCARDIAL INJURY.   MRSA PCR Screening     Status: None   Collection Time: 08/17/15  5:30 PM  Result Value Ref Range   MRSA by PCR NEGATIVE NEGATIVE    Comment:        The GeneXpert MRSA Assay (FDA approved for NASAL specimens only), is one component of a comprehensive MRSA colonization surveillance program. It is not intended to diagnose MRSA infection nor to guide or monitor treatment for MRSA infections.   Troponin I     Status: None   Collection Time: 08/17/15  8:58 PM   Result Value Ref Range   Troponin I 0.03 <0.031 ng/mL    Comment:        NO INDICATION OF MYOCARDIAL INJURY.   TSH     Status: None   Collection Time: 08/18/15  5:53 AM  Result Value Ref Range   TSH 0.709 0.350 - 4.500 uIU/mL  Comprehensive metabolic panel     Status: Abnormal   Collection Time: 08/18/15  5:53 AM  Result Value Ref Range   Sodium 137 135 - 145 mmol/L   Potassium 4.3 3.5 - 5.1 mmol/L   Chloride 94 (L) 101 - 111 mmol/L   CO2 31 22 - 32 mmol/L   Glucose, Bld 124 (H) 65 - 99 mg/dL   BUN 48 (H) 6 - 20 mg/dL   Creatinine, Ser 1.54 (H) 0.61 - 1.24 mg/dL   Calcium 8.8 (L) 8.9 - 10.3 mg/dL   Total Protein 6.4 (L) 6.5 - 8.1 g/dL   Albumin 3.2 (L) 3.5 - 5.0 g/dL   AST 27 15 - 41 U/L   ALT 22 17 - 63 U/L   Alkaline Phosphatase 52 38 - 126 U/L   Total Bilirubin 2.1 (H) 0.3 - 1.2 mg/dL   GFR calc non Af Amer 49 (L) >60 mL/min   GFR calc Af Amer 57 (L) >60 mL/min    Comment: (NOTE) The eGFR has been calculated using the CKD EPI equation. This calculation has not been validated in all clinical situations. eGFR's persistently <60 mL/min signify possible Chronic Kidney Disease.    Anion gap 12 5 - 15  CBC     Status: Abnormal   Collection Time: 08/18/15  5:53 AM  Result Value Ref Range   WBC 5.9 3.8 - 10.6 K/uL   RBC 5.37 4.40 - 5.90 MIL/uL   Hemoglobin 14.9 13.0 - 18.0 g/dL   HCT 46.2 40.0 - 52.0 %   MCV 86.1 80.0 - 100.0 fL   MCH 27.7 26.0 - 34.0 pg   MCHC 32.2 32.0 - 36.0 g/dL   RDW 18.0 (H) 11.5 - 14.5 %   Platelets 147 (L) 150 - 440 K/uL  Protime-INR     Status: Abnormal   Collection Time: 08/18/15  5:53 AM  Result Value Ref Range   Prothrombin Time >90.0 (H) 11.4 - 15.0 seconds   INR >10.00 (HH)     Comment: CRITICAL RESULT CALLED TO, READ BACK BY AND VERIFIED WITH: JAMIE SMITH GREGORY AT 0752 ON 08/18/15...[facility]   Lipid panel     Status: Abnormal   Collection Time: 08/18/15  5:53 AM    Result Value Ref Range   Cholesterol 149 0 - 200 mg/dL    Triglycerides 71 <150 mg/dL   HDL 38 (L) >40 mg/dL   Total CHOL/HDL Ratio 3.9 RATIO   VLDL 14 0 - 40 mg/dL   LDL Cholesterol 97 0 - 99 mg/dL    Comment:        Total Cholesterol/HDL:CHD Risk Coronary Heart Disease Risk Table                     Men   Women  1/2 Average Risk   3.4   3.3  Average Risk       5.0   4.4  2 X Average Risk   9.6   7.1  3 X Average Risk  23.4   11.0        Use the calculated Patient Ratio above and the CHD Risk Table to determine the patient's CHD Risk.        ATP III CLASSIFICATION (LDL):  <100     mg/dL   Optimal  100-129  mg/dL   Near or Above                    Optimal  130-159  mg/dL   Borderline  160-189  mg/dL   High  >190     mg/dL   Very High   Blood gas, arterial     Status: Abnormal   Collection Time: 08/18/15 11:10 AM  Result Value Ref Range   FIO2 0.48    Delivery systems NASAL CANNULA    pH, Arterial 7.47 (H) 7.350 - 7.450   pCO2 arterial 45 32.0 - 48.0 mmHg   pO2, Arterial 55 (L) 83.0 - 108.0 mmHg   Bicarbonate 32.8 (H) 21.0 - 28.0 mEq/L   Acid-Base Excess 8.0 (H) 0.0 - 3.0 mmol/L   O2 Saturation 90.2 %   Patient temperature 37.0    Collection site RIGHT RADIAL    Sample type ARTERIAL DRAW    Allens test (pass/fail) POSITIVE (A) PASS  Prepare fresh frozen plasma     Status: None (Preliminary result)   Collection Time: 08/18/15 11:42 AM  Result Value Ref Range   Unit Number [id number]    Blood Component Type THAWED PLASMA    Unit division 00    Status of Unit ISSUED    Transfusion Status OK TO TRANSFUSE    Unit Number [id number]    Blood Component Type THAWED PLASMA    Unit division 00    Status of Unit ISSUED    Transfusion Status OK TO TRANSFUSE   Type and screen [facility]     Status: None   Collection Time: 08/18/15 12:06 PM  Result Value Ref Range   ABO/RH(D) O POS    Antibody Screen NEG    Sample Expiration 08/21/2015   ABO/Rh     Status: None   Collection Time: 08/18/15 12:07 PM   Result Value Ref Range   ABO/RH(D) O POS   Protime-INR     Status: Abnormal   Collection Time: 08/18/15  8:12 PM  Result Value Ref Range   Prothrombin Time 23.6 (H) 11.4 - 15.0 seconds   INR 2.09    Dg Chest Portable 1 View  08/17/2015  CLINICAL DATA:  Shortness of breath, COPD, CHF EXAM: PORTABLE CHEST 1 VIEW COMPARISON:  02/28/2015 FINDINGS: Limited exam because of portable technique and body habitus. Cardiomegaly evident with diffuse increased interstitial type opacities concerning for   edema. Early CHF is favored. Low lung volumes with basilar atelectasis. No large effusion or pneumothorax. Trachea midline. IMPRESSION: Cardiomegaly with mild early interstitial edema pattern. Electronically Signed   By: M.  Shick M.D.   On: 08/17/2015 12:26    Assessment:  The patient is a 54 y.o. gentleman with chronic atrial fibrillation on Coumadin.  He was admitted with a COPD flare.  On admission, his INR was > 10 likely secondary to change in diet.  He denies any new medications or diarrhea.  Labs were notable for an elevated bilirubin (unclear etiology).  He has received vitamin K 1 mg SQ and vitamin K 10 mg IV as well as FFP.  Anticipate INR will normalize.  Patient previously on Pradaxa, but discontinued secondary to prior history of interaction with TB medications.  Patient denies any bleeding except for epistaxis prior to admission.    Plan:   1.  Check INR daily.  Suspect will be normal/near normal in AM. 2.  Consider switch back to Pradaxa if ok per Dr. Paraschos. 3.  Consider evaluation of elevated bilirubin (fractionate).   Thank you for allowing me to participate in [name] Marcell Sr. 's care.  I will follow him closely with you while hospitalized and after discharge in the outpatient department.  Melissa C Corcoran, MD  08/19/2015, 12:03 AM    

## 2015-08-19 NOTE — Progress Notes (Signed)
RN spoke with Dr. Thedore MinsSingh on the phone and MD gave order for patient to be Stepdown level of care.

## 2015-08-19 NOTE — Progress Notes (Signed)
ANTIBIOTIC CONSULT NOTE - INITIAL  Pharmacy Consult for Levaquin Indication: AECOPD  No Known Allergies  Patient Measurements: Height: 5\' 11"  (180.3 cm) Weight: (!) 321 lb 14 oz (146 kg) IBW/kg (Calculated) : 75.3  Vital Signs: Temp: 97.6 F (36.4 C) (10/28 0900) Temp Source: Axillary (10/28 0900) BP: 103/64 mmHg (10/28 1100) Pulse Rate: 102 (10/28 0915) Intake/Output from previous day: 10/27 0701 - 10/28 0700 In: 668.5 [P.O.:236; I.V.:30.5; Blood:252; IV Piggyback:150] Out: 600 [Urine:600] Intake/Output from this shift: Total I/O In: 120 [P.O.:120] Out: 226 [Urine:225; Stool:1]  Labs:  Recent Labs  08/17/15 1204 08/18/15 0553 08/19/15 0407  WBC 9.1 5.9 11.1*  HGB 16.0 14.9 14.6  PLT 159 147* 158  CREATININE 1.56* 1.54* 1.77*   Estimated Creatinine Clearance: 68.3 mL/min (by C-G formula based on Cr of 1.77). No results for input(s): VANCOTROUGH, VANCOPEAK, VANCORANDOM, GENTTROUGH, GENTPEAK, GENTRANDOM, TOBRATROUGH, TOBRAPEAK, TOBRARND, AMIKACINPEAK, AMIKACINTROU, AMIKACIN in the last 72 hours.   Microbiology: Recent Results (from the past 720 hour(s))  Blood culture (routine x 2)     Status: None (Preliminary result)   Collection Time: 08/17/15 12:04 PM  Result Value Ref Range Status   Specimen Description BLOOD LEFT HAND  Final   Special Requests BOTTLES DRAWN AEROBIC AND ANAEROBIC 2CC  Final   Culture NO GROWTH 1 DAY  Final   Report Status PENDING  Incomplete  Blood culture (routine x 2)     Status: None (Preliminary result)   Collection Time: 08/17/15 12:05 PM  Result Value Ref Range Status   Specimen Description BLOOD RIGHT WRIST  Final   Special Requests BOTTLES DRAWN AEROBIC AND ANAEROBIC 3CC  Final   Culture NO GROWTH 1 DAY  Final   Report Status PENDING  Incomplete  MRSA PCR Screening     Status: None   Collection Time: 08/17/15  5:30 PM  Result Value Ref Range Status   MRSA by PCR NEGATIVE NEGATIVE Final    Comment:        The GeneXpert MRSA  Assay (FDA approved for NASAL specimens only), is one component of a comprehensive MRSA colonization surveillance program. It is not intended to diagnose MRSA infection nor to guide or monitor treatment for MRSA infections.     Medical History: Past Medical History  Diagnosis Date  . Atrial fibrillation (HCC)   . Hypertension   . Tuberculosis   . Sleep apnea   . COPD (chronic obstructive pulmonary disease) (HCC)     on 5l home oxygen  . Obesity   . Gout   . Hyperlipidemia     Medications:  Scheduled:  . allopurinol  300 mg Oral Daily  . antiseptic oral rinse  7 mL Mouth Rinse BID  . atenolol  50 mg Oral Daily  . dabigatran  150 mg Oral Q12H  . diltiazem  240 mg Oral Daily  . furosemide  40 mg Intravenous BID  . ipratropium-albuterol  3 mL Nebulization Q6H  . levofloxacin (LEVAQUIN) IV  500 mg Intravenous Q24H  . methylPREDNISolone (SOLU-MEDROL) injection  60 mg Intravenous Q6H  . mometasone-formoterol  2 puff Inhalation BID  . pantoprazole  40 mg Oral Daily  . pravastatin  20 mg Oral q1800  . senna  1 tablet Oral BID  . sodium chloride  3 mL Intravenous Q12H  . sodium chloride  3 mL Intravenous Q12H   Infusions:    Assessment: 54 y/o M admitted with acute respiratory failure due to CHF and COPD exacerbatiion ordered empiric Levaquin.  Plan:  Will continue Levaquin 500 mg iv q 24 hours. Will f/u renal function.   Jonathan [name] D 08/19/2015,12:00 PM

## 2015-08-19 NOTE — Progress Notes (Signed)
Date: 08/19/2015,   MRN# [id number] KUZEY OGATA Sr. 1961-08-04 Code Status:     Code Status Orders        Start     Ordered   08/17/15 1455  Full code   Continuous     08/17/15 1454     Hosp day:@LENGTHOFSTAYDAYS @ Referring MD: @     PCP:      AdmissionWeight: (!) 321 lb 14 oz (146 kg)                 CurrentWeight: (!) 321 lb 14 oz (146 kg)   HPI: Feeling better but still quite short of breath, on high fio2 on 6-8 liters at home. Marland Kitchen    PMHX:   Past Medical History  Diagnosis Date  . Atrial fibrillation (HCC)   . Hypertension   . Tuberculosis   . Sleep apnea   . COPD (chronic obstructive pulmonary disease) (HCC)     on 5l home oxygen  . Obesity   . Gout   . Hyperlipidemia    Surgical Hx:  Past Surgical History  Procedure Laterality Date  . Bronchoscopy     Family Hx:  Family History  Problem Relation Age of Onset  . Cancer Mother   . Emphysema Father    Social Hx:   Social History  Substance Use Topics  . Smoking status: Never Smoker   . Smokeless tobacco: None  . Alcohol Use: Yes   Medication:    Home Medication:  No current outpatient prescriptions on file.  Current Medication: @   Allergies:  Review of patient's allergies indicates no known allergies.  Review of Systems: Gen:  Denies  fever, sweats, chills HEENT: Denies blurred vision, double vision, ear pain, eye pain, hearing loss, nose bleeds, sore throat Cvc:  No dizziness, chest pain or heaviness Resp:   Still dyspneic, no coug, min wheeze Gi: Denies swallowing difficulty, stomach pain, nausea or vomiting, diarrhea, constipation, bowel incontinence Gu:  Denies bladder incontinence, burning urine Ext:   No Joint pain, stiffness or swelling Skin: No skin rash, easy bruising or bleeding or hives Endoc:  No polyuria, polydipsia , polyphagia or weight change Psych: No depression, insomnia or hallucinations  Other:  All other systems negative  Physical Examination:    VS: BP 103/64 mmHg  Pulse 102  Temp(Src) 97.6 F (36.4 C) (Axillary)  Resp 32  Ht  (1.803 m)  Wt 321 lb 14 oz (146 kg)  BMI 44.91 kg/m2  SpO2 94%  General Appearance: No distress  Neuro: without focal findings, mental status, speech normal, alert and oriented, cranial nerves 2-12 intact, reflexes normal and symmetric, sensation grossly normal  HEENT: PERRLA, EOM intact, no ptosis, no other lesions noticed, Mallampati: Pulmonary:.rare wheezing, + ve rales  Sputum Production:   Cardiovascular:  Normal S1,S2.  No m/r/g.  Abdominal aorta pulsation normal.    Abdomen:Benign, Soft, non-tender, No masses, hepatosplenomegaly, No lymphadenopathy Endoc: No evident thyromegaly, no signs of acromegaly or Cushing features Skin:   warm, no rashes, no ecchymosis  Extremities: normal, no cyanosis, clubbing, no edema, warm with normal capillary refill. Other findings:   Labs results:   Recent Labs     08/17/15  1204  08/18/15  0553  08/18/15  2012  08/19/15  0407  HGB  16.0  14.9   --   14.6  HCT  51.0  46.2   --   45.4  MCV  86.9  86.1   --   86.1  WBC  9.1  5.9   --   11.1*  BUN  48*  48*   --   61*  CREATININE  1.56*  1.54*   --   1.77*  GLUCOSE  132*  124*   --   121*  CALCIUM  9.0  8.8*   --   9.1  INR  >10.00*  >10.00*  2.09  1.54  ,  CLINICAL DATA: Shortness of breath, COPD, CHF  EXAM: PORTABLE CHEST 1 VIEW  COMPARISON: 02/28/2015  FINDINGS: Limited exam because of portable technique and body habitus. Cardiomegaly evident with diffuse increased interstitial type opacities concerning for edema. Early CHF is favored. Low lung volumes with basilar atelectasis. No large effusion or pneumothorax. Trachea midline.  IMPRESSION: Cardiomegaly with mild early interstitial edema pattern.   Electronically Signed  By: Judie PetitM. Shick M.D.  On: 08/17/2015 12:26  Assessment and Plan: [name] is here for increase sob, low sats, in pulmonary edema (hx of diastolic  dysfunction) and copd exacerabation. Do not see frank pneumonia. Since being here, he has improved.  -following cardiology recs -diuresis, following renal parameters -wean o2 as tolerated -resume pre admit copd regimen -solumedrol 60 mg iv q 6 hrs -may be able to go to the floor   Obstructive sleep apnea -bipap during sleep (ok to use his system and settings) -weight loss  S/p Tb, s/p completed treatment -continue to observe   I have personally obtained a history, examined the patient, evaluated laboratory and imaging results, formulated the assessment and plan and placed orders.  The Patient requires high complexity decision making for assessment and support, frequent evaluation and titration of therapies, application of advanced monitoring technologies and extensive interpretation of multiple databases.   Lucetta Baehr,M.D. Pulmonary & Critical care Medicine [facility]

## 2015-08-19 NOTE — Progress Notes (Signed)
Decreased FiO2 through Bipap to 40%, patient has maintained sats of 100% on 60% FiO2 throughout the day.

## 2015-08-19 NOTE — Progress Notes (Signed)
Subjective: Patient is less short of breath today. No complaint of chest pain, dizziness or palpitations.  No abdominal pain.  Objective: Vital signs in last 24 hours: Temp:  [97.2 F (36.2 C)-98.6 F (37 C)] 97.6 F (36.4 C) (10/28 0900) Pulse Rate:  [30-118] 102 (10/28 0915) Resp:  [16-45] 25 (10/28 1200) BP: (57-122)/(37-99) 120/76 mmHg (10/28 1200) SpO2:  [91 %-100 %] 94 % (10/28 1100) FiO2 (%):  [60 %] 60 % (10/28 0811) Weight change:  Last BM Date: 08/17/15  Intake/Output from previous day: 10/27 0701 - 10/28 0700 In: 668.5 [P.O.:236; I.V.:30.5; Blood:252; IV Piggyback:150] Out: 600 [Urine:600] Intake/Output this shift: Total I/O In: 120 [P.O.:120] Out: 226 [Urine:225; Stool:1]  GENERAL:alert, oriented x 3, no acute distress.  Oxygen by nasal cannula in use. EYES: no pallor, no icterus, extraocular muscles are intact.  HEENT: Head atraumatic, normocephalic. Oropharynx and nasopharynx clear.  NECK: supple, mild jugular venous distention. No thyroid enlargement, no tenderness.  LUNGS: decreased wheezing, scattered rhonchi, bibasilar crackles. No use of accessory muscles of respiration.  CARDIOVASCULAR: irregular rhythm, S1, S2, + systolic murmur.  ABDOMEN: Soft, non-tender, normal bowel sounds, no organomegaly or mass.  EXTREMITIES: ++ bilateral pitting lower leg edema. No cyanosis or clubbing.  NEUROLOGIC: normal strength and tone in bilateral upper and lower extremities, sensations are grossly intact, normal speech. SKIN: No obvious rash, lesion, or ulcer.   Lab Results:  Recent Labs  08/18/15 0553 08/19/15 0407  WBC 5.9 11.1*  HGB 14.9 14.6  HCT 46.2 45.4  PLT 147* 158   BMET  Recent Labs  08/18/15 0553 08/19/15 0407  NA 137 135  K 4.3 4.1  CL 94* 92*  CO2 31 30  GLUCOSE 124* 121*  BUN 48* 61*  CREATININE 1.54* 1.77*  CALCIUM 8.8* 9.1    Studies/Results: No results found.  Medications:  Scheduled Meds: . allopurinol  300 mg Oral  Daily  . antiseptic oral rinse  7 mL Mouth Rinse BID  . atenolol  50 mg Oral Daily  . dabigatran  150 mg Oral Q12H  . diltiazem  240 mg Oral Daily  . furosemide  80 mg Intravenous BID  . ipratropium-albuterol  3 mL Nebulization Q6H  . levofloxacin (LEVAQUIN) IV  500 mg Intravenous Q24H  . methylPREDNISolone (SOLU-MEDROL) injection  60 mg Intravenous Q6H  . mometasone-formoterol  2 puff Inhalation BID  . pantoprazole  40 mg Oral Daily  . pravastatin  20 mg Oral q1800  . senna  1 tablet Oral BID  . sodium chloride  3 mL Intravenous Q12H  . sodium chloride  3 mL Intravenous Q12H   Continuous Infusions:  PRN Meds:.sodium chloride, acetaminophen **OR** acetaminophen, albuterol, HYDROcodone-homatropine, ondansetron **OR** ondansetron (ZOFRAN) IV, sodium chloride, zolpidem   Assessment/Plan: 54 year old male presented with acute onset shortness of breath and hypoxia.  History of chronic atrial fibrillation and multiple pulmonary problems including pulmonary tuberculosis, advanced fibrosis, cavitations, chronic obstructive pulmonary disease, obstructive sleep apnea and hypoxia.  Patient is closely followed by Dr. Meredeth IdeFleming and Dr. Darrold JunkerParaschos. He uses supplemental oxygen by nasal cannula at 5-8 L. Patient was started on BiPAP in the emergency room  Admitted to CCU for further management.    Acute on chronic hypoxic respiratory failure: improving, received BiPAP initially, currently oxygenating well with supplemental oxygen by nasal cannula.  Arterial blood gas has improved.  Acute exacerbation of COPD: continue IV Solu-Medrol, IV levofloxacin, DuoNeb treatments and Dulera.  Acute diastolic congestive heart failure and chronic atrial fibrillation with controlled ventricular  response: continue diuresis with IV Lasix, increase Lasix to 80 mg BID, monitor input-output, torsemide is currently unavailable (non-formulary). Rate is controlled with usual dose of Cardizem CD 240 mg and atenolol 50 mg daily.   Warfarin was held for supratherapeutic INR over 10 yesterday, coagulopathy corrected, INR improved to 1.54 today. Start Pradaxa 150 mg twice daily. Echocardiogram in July, 2016 revealed normal LV systolic function with mild LVH. Moderate RV systolic dysfunction. Mild mitral regurgitation, moderate tricuspid regurgitation, trivial aortic and pulmonary regurgitation.  Coagulopathy: corrected with Vit K and fresh frozen plasma. INR improved from over 10 yesterday to 1.54 today. No active bleeding. No dark stools or hematuria. Continue close monitoring for at active bleeding.   Obstructive sleep apnea:  Uses CPAP.  GI prophylaxis with Protonix.  Transfer to step down unit.   LOS: 2 days   Marcin Holte 08/19/2015, 12:43 PM

## 2015-08-20 ENCOUNTER — Encounter: Payer: Self-pay | Admitting: Internal Medicine

## 2015-08-20 DIAGNOSIS — G4730 Sleep apnea, unspecified: Secondary | ICD-10-CM

## 2015-08-20 DIAGNOSIS — J441 Chronic obstructive pulmonary disease with (acute) exacerbation: Secondary | ICD-10-CM | POA: Diagnosis present

## 2015-08-20 DIAGNOSIS — G4710 Hypersomnia, unspecified: Secondary | ICD-10-CM | POA: Diagnosis present

## 2015-08-20 DIAGNOSIS — I5033 Acute on chronic diastolic (congestive) heart failure: Secondary | ICD-10-CM | POA: Diagnosis present

## 2015-08-20 DIAGNOSIS — J45901 Unspecified asthma with (acute) exacerbation: Secondary | ICD-10-CM

## 2015-08-20 HISTORY — DX: Chronic obstructive pulmonary disease with (acute) exacerbation: J44.1

## 2015-08-20 HISTORY — DX: Acute on chronic diastolic (congestive) heart failure: I50.33

## 2015-08-20 MED ORDER — ATENOLOL 50 MG PO TABS
50.0000 mg | ORAL_TABLET | Freq: Every day | ORAL | Status: DC
  Administered 2015-08-20 – 2015-08-26 (×7): 50 mg via ORAL
  Filled 2015-08-20 (×5): qty 1

## 2015-08-20 MED ORDER — LEVOFLOXACIN 500 MG PO TABS
500.0000 mg | ORAL_TABLET | Freq: Every day | ORAL | Status: DC
  Administered 2015-08-20 – 2015-08-26 (×7): 500 mg via ORAL
  Filled 2015-08-20 (×7): qty 1

## 2015-08-20 MED ORDER — METHYLPREDNISOLONE SODIUM SUCC 125 MG IJ SOLR
60.0000 mg | Freq: Two times a day (BID) | INTRAMUSCULAR | Status: DC
  Administered 2015-08-20 – 2015-08-22 (×4): 60 mg via INTRAVENOUS
  Filled 2015-08-20 (×5): qty 2

## 2015-08-20 MED ORDER — IPRATROPIUM-ALBUTEROL 0.5-2.5 (3) MG/3ML IN SOLN: 3.0000 mL | Freq: Four times a day (QID) | RESPIRATORY_TRACT | Status: DC | PRN

## 2015-08-20 MED ORDER — DILTIAZEM HCL ER COATED BEADS 240 MG PO CP24
240.0000 mg | ORAL_CAPSULE | Freq: Every day | ORAL | Status: DC
  Administered 2015-08-20 – 2015-08-26 (×7): 240 mg via ORAL
  Filled 2015-08-20 (×5): qty 1

## 2015-08-20 MED ORDER — FLUTICASONE PROPIONATE 50 MCG/ACT NA SUSP
2.0000 | Freq: Every day | NASAL | Status: DC
  Administered 2015-08-21 – 2015-08-25 (×5): 2 via NASAL
  Filled 2015-08-20: qty 16

## 2015-08-20 MED ORDER — FUROSEMIDE 40 MG PO TABS
80.0000 mg | ORAL_TABLET | Freq: Every day | ORAL | Status: DC
  Administered 2015-08-21: 80 mg via ORAL
  Filled 2015-08-20: qty 2

## 2015-08-20 MED ORDER — LORAZEPAM 2 MG/ML IJ SOLN
0.5000 mg | Freq: Four times a day (QID) | INTRAMUSCULAR | Status: DC | PRN
  Administered 2015-08-24 – 2015-08-25 (×2): 0.5 mg via INTRAVENOUS
  Filled 2015-08-20 (×2): qty 1

## 2015-08-20 NOTE — Progress Notes (Signed)
Subjective: Continues to slowly improve; much less SOB when resting, but still sig DOE, though reports element of this at baseline.  No chest pain, palpitations, fever.  Has chronic edema; this may be better.  Gets wheezing with exertion; confirmed by nursing.  Objective: Vital signs in last 24 hours: Temp:  [97.7 F (36.5 C)] 97.7 F (36.5 C) (10/28 1930) Pulse Rate:  [38-99] 99 (10/29 0911) Resp:  [15-37] 22 (10/29 0300) BP: (96-134)/(62-100) 108/71 mmHg (10/29 0911) SpO2:  [94 %-100 %] 98 % (10/29 0739) FiO2 (%):  [40 %] 40 % (10/29 0122) Weight change:  Last BM Date: 08/19/15  Intake/Output from previous day: 10/28 0701 - 10/29 0700 In: 460 [P.O.:360; IV Piggyback:100] Out: 1776 [Urine:1775; Stool:1] Intake/Output this shift: Total I/O In: 6 [I.V.:6] Out: 200 [Urine:200]  GENERAL:alert, oriented, no acute distress.  Oxygen by nasal cannula in use.  HEENT: PERRL; no scleral icterus. Oropharynx clear.  NECK: supple. No thyroid enlargement, no tenderness.  LUNGS: scattered bibasilar crackles with poor airflow. No use of accessory muscles.  CARDIOVASCULAR: irregular rhythm, S1, S2, + systolic murmur.  ABDOMEN: Soft, non-tender, normal bowel sounds, no organomegaly or mass.  EXTREMITIES: 2+ bilateral pitting lower leg edema. No cyanosis or clubbing.  NEUROLOGIC: CN appear grossly intact; moves all extremities. SKIN: No sig rashes or nodules LYMPH: no cervical or supraclavicular lymphadenopathy  Lab Results:  Recent Labs  08/18/15 0553 08/19/15 0407  WBC 5.9 11.1*  HGB 14.9 14.6  HCT 46.2 45.4  PLT 147* 158   BMET  Recent Labs  08/18/15 0553 08/19/15 0407  NA 137 135  K 4.3 4.1  CL 94* 92*  CO2 31 30  GLUCOSE 124* 121*  BUN 48* 61*  CREATININE 1.54* 1.77*  CALCIUM 8.8* 9.1    Studies/Results: No results found.  Medications:  Scheduled Meds: . allopurinol  300 mg Oral Daily  . antiseptic oral rinse  7 mL Mouth Rinse BID  . atenolol  50 mg  Oral Daily  . dabigatran  150 mg Oral Q12H  . diltiazem  240 mg Oral Daily  . furosemide  80 mg Oral Daily  . ipratropium-albuterol  3 mL Nebulization Q6H  . levofloxacin  500 mg Oral Daily  . methylPREDNISolone (SOLU-MEDROL) injection  60 mg Intravenous Q12H  . mometasone-formoterol  2 puff Inhalation BID  . pantoprazole  40 mg Oral Daily  . pravastatin  20 mg Oral q1800  . senna  1 tablet Oral BID  . sodium chloride  3 mL Intravenous Q12H  . sodium chloride  3 mL Intravenous Q12H    Assessment/Plan: 54 year old male presented with acute onset shortness of breath and hypoxia.  History of chronic atrial fibrillation and multiple pulmonary problems including pulmonary tuberculosis, advanced fibrosis, cavitations, chronic obstructive pulmonary disease, obstructive sleep apnea and hypoxia.  Patient is closely followed by Dr. Meredeth Ide and Dr. Darrold Junker. He uses supplemental oxygen by nasal cannula at 5-8 L. Patient was started on BiPAP in the emergency room  Admitted to CCU for further management.    Acute on chronic hypoxic respiratory failure due to CHF/Acute exacerbation of COPD: appreciate pulmonary input.  Cont inhaled medications. Will try to wean steroids as able. Mobilize as able.  Acute diastolic congestive heart failure/chronic atrial fibrillation with controlled ventricular response: Creatinine rising, and will need to reduce lasix for this and prevention of any contraction alkalosis.  Follow exam.  Ted hose.  On Pradaxa, given coagulopathy on coumadin.  Cardiology following. Echocardiogram in July, 2016 revealed normal  LV systolic function with mild LVH. Moderate RV systolic dysfunction. Mild mitral regurgitation, moderate tricuspid regurgitation, trivial aortic and pulmonary regurgitation.  Coagulopathy: corrected with Vit K and fresh frozen plasma. Now on Pradaxa; following CBC  Obstructive sleep apnea:  Use BiPap whenever sleeping  Elevated indirect bilirubin. Review of prior labs  does not show evidence of Gilbert's.  No sig evidence of hemolysis by electrolytes/CBC.  May be medication related and will follow as doses are reduced.    GI prophylaxis with Protonix. DVT prophylaxis with pradaxa  Discussed with pt and nursing.  Transfer to telemetry   Masa Lubin J Lawarence Meek III 08/20/2015, 9:29 AM

## 2015-08-20 NOTE — Progress Notes (Signed)
[facility]  SUBJECTIVE: I'm feeling better   Filed Vitals:   08/20/15 0200 08/20/15 0300 08/20/15 0739 08/20/15 0911  BP: 114/97 114/81  108/71  Pulse: 93 84  99  Temp:      TempSrc:      Resp: 27 22    Height:      Weight:      SpO2: 100% 100% 98%      Intake/Output Summary (Last 24 hours) at 08/20/15 1124 Last data filed at 08/20/15 1000  Gross per 24 hour  Intake    346 ml  Output   2050 ml  Net  -1704 ml      PHYSICAL EXAM  General: Well developed, well nourished, in no acute distress HEENT:  Normocephalic and atramatic Neck:  No JVD.  Lungs: Clear bilaterally to auscultation and percussion. Heart: HRRR . Normal S1 and S2 without gallops or murmurs.  Abdomen: Bowel sounds are positive, abdomen soft and non-tender  Msk:  Back normal, normal gait. Normal strength and tone for age. Extremities: No clubbing, cyanosis or edema.   Neuro: Alert and oriented X 3. Psych:  Good affect, responds appropriately   LABS: Basic Metabolic Panel:  Recent Labs  16/07/9609/27/16 0553 08/19/15 0407  NA 137 135  K 4.3 4.1  CL 94* 92*  CO2 31 30  GLUCOSE 124* 121*  BUN 48* 61*  CREATININE 1.54* 1.77*  CALCIUM 8.8* 9.1   Liver Function Tests:  Recent Labs  08/18/15 0553 08/19/15 0407  AST 27 28  ALT 22 22  ALKPHOS 52 54  BILITOT 2.1* 3.0*  2.9*  PROT 6.4* 6.6  ALBUMIN 3.2* 3.3*   No results for input(s): LIPASE, AMYLASE in the last 72 hours. CBC:  Recent Labs  08/17/15 1204 08/18/15 0553 08/19/15 0407  WBC 9.1 5.9 11.1*  NEUTROABS 7.9*  --  10.3*  HGB 16.0 14.9 14.6  HCT 51.0 46.2 45.4  MCV 86.9 86.1 86.1  PLT 159 147* 158   Cardiac Enzymes:  Recent Labs  08/17/15 1204 08/17/15 1636 08/17/15 2058  TROPONINI 0.03 0.03 0.03   BNP: Invalid input(s): POCBNP D-Dimer: No results for input(s): DDIMER in the last 72 hours. Hemoglobin A1C: No results for input(s): HGBA1C in the last 72 hours. Fasting Lipid Panel:  Recent Labs  08/18/15 0553   CHOL 149  HDL 38*  LDLCALC 97  TRIG 71  CHOLHDL 3.9   Thyroid Function Tests:  Recent Labs  08/18/15 0553  TSH 0.709   Anemia Panel: No results for input(s): VITAMINB12, FOLATE, FERRITIN, TIBC, IRON, RETICCTPCT in the last 72 hours.  No results found.   Echo   TELEMETRY: Normal sinus rhythm:  ASSESSMENT AND PLAN:  Principal Problem:   Acute on chronic diastolic CHF (congestive heart failure) (HCC) Active Problems:   A-fib (HCC)   Acute respiratory failure (HCC)   Hypersomnia with sleep apnea   Acute exacerbation of COPD with asthma (HCC)    1. Respiratory failure, multifactorial, secondary to COPD exacerbation, sleep apnea, pulmonary tuberculosis, and acute on chronic diastolic congestive heart failure. Patient has demonstrated medical improvement with BiPAP treatment, intravenous steroids, and diuresis. 2. Acute on chronic diastolic congestive heart failure, improved, with concomitant elevation of BUN/creatinine creatinine following diuresis. 3. Paroxysmal atrial fibrillation/atrial flutter, supratherapeutic on warfarin, corrected with vitamin K and fresh frozen plasma, now back on Pradaxa  Recommendations  1. Agree with reducing furosemide dose. Patient likely at appropriate dry weight. 2. Continue Pradaxa for stroke prevention 3. Defer further cardiac  diagnostics at this time  Signed off for now, please call if any questions   Jaxsen Bernhart, MD, PhD, [facility] 08/20/2015 11:24 AM

## 2015-08-21 LAB — COMPREHENSIVE METABOLIC PANEL
ALBUMIN: 3.2 g/dL — AB (ref 3.5–5.0)
ALT: 22 U/L (ref 17–63)
ANION GAP: 10 (ref 5–15)
AST: 25 U/L (ref 15–41)
Alkaline Phosphatase: 55 U/L (ref 38–126)
BILIRUBIN TOTAL: 2.1 mg/dL — AB (ref 0.3–1.2)
BUN: 66 mg/dL — ABNORMAL HIGH (ref 6–20)
CO2: 32 mmol/L (ref 22–32)
Calcium: 9 mg/dL (ref 8.9–10.3)
Chloride: 93 mmol/L — ABNORMAL LOW (ref 101–111)
Creatinine, Ser: 1.68 mg/dL — ABNORMAL HIGH (ref 0.61–1.24)
GFR, EST AFRICAN AMERICAN: 51 mL/min — AB (ref >60)
GFR, EST NON AFRICAN AMERICAN: 44 mL/min — AB (ref >60)
Glucose, Bld: 124 mg/dL — ABNORMAL HIGH (ref 65–99)
POTASSIUM: 3.7 mmol/L (ref 3.5–5.1)
Sodium: 135 mmol/L (ref 135–145)
TOTAL PROTEIN: 6.4 g/dL — AB (ref 6.5–8.1)

## 2015-08-21 LAB — CBC
HEMATOCRIT: 48.3 % (ref 40.0–52.0)
HEMOGLOBIN: 15.3 g/dL (ref 13.0–18.0)
MCH: 27.5 pg (ref 26.0–34.0)
MCHC: 31.8 g/dL — ABNORMAL LOW (ref 32.0–36.0)
MCV: 86.5 fL (ref 80.0–100.0)
Platelets: 162 10*3/uL (ref 150–440)
RBC: 5.59 MIL/uL (ref 4.40–5.90)
RDW: 18.2 % — ABNORMAL HIGH (ref 11.5–14.5)
WBC: 14.2 10*3/uL — AB (ref 3.8–10.6)

## 2015-08-21 MED ORDER — ALUM & MAG HYDROXIDE-SIMETH 200-200-20 MG/5ML PO SUSP
30.0000 mL | Freq: Two times a day (BID) | ORAL | Status: DC | PRN
  Administered 2015-08-21 – 2015-08-25 (×3): 30 mL via ORAL
  Filled 2015-08-21 (×3): qty 30

## 2015-08-21 NOTE — Care Management Note (Signed)
Case Management Note  Patient Details  Name: Jonathan PaceKelvin D Kenly Henckel Sr. MRN: [id number] Date of Birth: 08/29/1961  Subjective/Objective:   Jonathan [name] reports that his home oxygen is supplied by [facility] (contact Ofilia NeasJim Powers cell [phone]).                  Action/Plan:   Expected Discharge Date:                  Expected Discharge Plan:     In-House Referral:     Discharge planning Services     Post Acute Care Choice:    Choice offered to:     DME Arranged:    DME Agency:     HH Arranged:    HH Agency:     Status of Service:  In process, will continue to follow  Medicare Important Message Given:    Date Medicare IM Given:    Medicare IM give by:    Date Additional Medicare IM Given:    Additional Medicare Important Message give by:     If discussed at Long Length of Stay Meetings, dates discussed:    Additional Comments:  [name], RN 08/21/2015, 10:41 AM

## 2015-08-21 NOTE — Progress Notes (Signed)
Paged Dr. Graciela HusbandsKlein re: patient's request for antacid. Patient c/o heartburn. No chest pain or sob.

## 2015-08-21 NOTE — Progress Notes (Signed)
Spoke to Dr. Graciela HusbandsKlein who ordered Maalox/Mylanta bid PRN.

## 2015-08-21 NOTE — Progress Notes (Signed)
Subjective: Remains very SOB with any activity, though at rest his symptoms have improved.  Sats remain stable.  No fever, chills; no sig cough.  Denies any other c/o currently.  Objective: Vital signs in last 24 hours: Temp:  [97.3 F (36.3 C)-97.7 F (36.5 C)] 97.5 F (36.4 C) (10/30 0753) Pulse Rate:  [80-96] 84 (10/30 0753) Resp:  [18-39] 18 (10/30 0753) BP: (96-122)/(48-99) 107/67 mmHg (10/30 0753) SpO2:  [88 %-100 %] 97 % (10/30 0753) FiO2 (%):  [40 %] 40 % (10/30 0220) Weight change:  Last BM Date: 08/20/15  Intake/Output from previous day: 10/29 0701 - 10/30 0700 In: 86 [I.V.:86] Out: 1820 [Urine:1820] Intake/Output this shift: Total I/O In: 243 [P.O.:240; I.V.:3] Out: 620 [Urine:620]  GENERAL:alert, oriented, no acute distress.  Oxygen by nasal cannula at 5L  HEENT: PERRL; no scleral icterus. Oropharynx dry w/o lesions NECK: supple. No thyroid enlargement, no tenderness.  LUNGS:  bibasilar crackles with poor airflow; no sig wheeze. No use of accessory muscles.  CARDIOVASCULAR: irregular rhythm, S1, S2, + systolic murmur.  ABDOMEN: Soft, non-tender, normal bowel sounds, no organomegaly or mass.  EXTREMITIES: 1+ bilateral pitting lower leg edema. No cyanosis or clubbing.  NEUROLOGIC: CN appear grossly intact; moves all extremities. SKIN: No sig rashes or nodules LYMPH: no cervical or supraclavicular lymphadenopathy  Lab Results:  Recent Labs  08/19/15 0407 08/21/15 0401  WBC 11.1* 14.2*  HGB 14.6 15.3  HCT 45.4 48.3  PLT 158 162   BMET  Recent Labs  08/19/15 0407 08/21/15 0401  NA 135 135  K 4.1 3.7  CL 92* 93*  CO2 30 32  GLUCOSE 121* 124*  BUN 61* 66*  CREATININE 1.77* 1.68*  CALCIUM 9.1 9.0    Studies/Results: No results found.  Medications:  Scheduled Meds: . allopurinol  300 mg Oral Daily  . antiseptic oral rinse  7 mL Mouth Rinse BID  . atenolol  50 mg Oral Daily  . atenolol  50 mg Oral Daily  . dabigatran  150 mg Oral  Q12H  . diltiazem  240 mg Oral Daily  . diltiazem  240 mg Oral Daily  . fluticasone  2 spray Each Nare Daily  . ipratropium-albuterol  3 mL Nebulization Q6H  . levofloxacin  500 mg Oral Daily  . methylPREDNISolone (SOLU-MEDROL) injection  60 mg Intravenous Q12H  . mometasone-formoterol  2 puff Inhalation BID  . pantoprazole  40 mg Oral Daily  . pravastatin  20 mg Oral q1800  . senna  1 tablet Oral BID  . sodium chloride  3 mL Intravenous Q12H  . sodium chloride  3 mL Intravenous Q12H    Assessment/Plan: 54 year old male presented with acute onset shortness of breath and hypoxia.  History of chronic atrial fibrillation and multiple pulmonary problems including pulmonary tuberculosis, advanced fibrosis, cavitations, chronic obstructive pulmonary disease, obstructive sleep apnea and hypoxia.  Patient is closely followed by Dr. Meredeth IdeFleming and Dr. Darrold JunkerParaschos. He uses supplemental oxygen by nasal cannula at 5-8 L. Patient was started on BiPAP in the emergency room  Admitted to CCU for further management.    Acute on chronic hypoxic respiratory failure due to CHF/Acute exacerbation of COPD: Appears back to baseline oxygen rate, but sig component of dyspnea/air hunger with any movement, which greatly limits him. Cont current inhaled meds, steroids.  Add ISP to encourage deeper breaths.  Might benefit from morphine, but BP too low to allow.  Not good theophylline candidate due to afib.    Acute on chronic diastolic  congestive heart failure/chronic atrial fibrillation with controlled ventricular response:  On Pradaxa, given coagulopathy on coumadin.  Cardiology following. Hold lasix x 1 day, given rising BUN.  Watch for fluid overload.  Coagulopathy: corrected with Vit K and fresh frozen plasma. Now on Pradaxa; following CBC periodically  Obstructive sleep apnea:  Use BiPap whenever sleeping  Elevated indirect bilirubin. Review of prior labs does not show evidence of Gilbert's.  No sig evidence of  hemolysis by electrolytes/CBC.  May be medication related; improved with adjustment in meds.  Following.   GI prophylaxis with Protonix. DVT prophylaxis with pradaxa  Discussed with pt and nursing.  Hydrate oxygen.   Curtis Sites III 08/21/2015, 10:46 AM

## 2015-08-22 LAB — COMPREHENSIVE METABOLIC PANEL
ALBUMIN: 3.3 g/dL — AB (ref 3.5–5.0)
ALK PHOS: 60 U/L (ref 38–126)
ALT: 26 U/L (ref 17–63)
ANION GAP: 10 (ref 5–15)
AST: 28 U/L (ref 15–41)
BILIRUBIN TOTAL: 2.2 mg/dL — AB (ref 0.3–1.2)
BUN: 68 mg/dL — ABNORMAL HIGH (ref 6–20)
CO2: 31 mmol/L (ref 22–32)
Calcium: 8.8 mg/dL — ABNORMAL LOW (ref 8.9–10.3)
Chloride: 93 mmol/L — ABNORMAL LOW (ref 101–111)
Creatinine, Ser: 1.69 mg/dL — ABNORMAL HIGH (ref 0.61–1.24)
GFR calc non Af Amer: 44 mL/min — ABNORMAL LOW (ref >60)
GFR, EST AFRICAN AMERICAN: 51 mL/min — AB (ref >60)
GLUCOSE: 132 mg/dL — AB (ref 65–99)
POTASSIUM: 3.8 mmol/L (ref 3.5–5.1)
Sodium: 134 mmol/L — ABNORMAL LOW (ref 135–145)
Total Protein: 6.4 g/dL — ABNORMAL LOW (ref 6.5–8.1)

## 2015-08-22 LAB — CBC
HEMATOCRIT: 46.9 % (ref 40.0–52.0)
HEMOGLOBIN: 15.2 g/dL (ref 13.0–18.0)
MCH: 28 pg (ref 26.0–34.0)
MCHC: 32.5 g/dL (ref 32.0–36.0)
MCV: 86.3 fL (ref 80.0–100.0)
Platelets: 156 10*3/uL (ref 150–440)
RBC: 5.43 MIL/uL (ref 4.40–5.90)
RDW: 18.3 % — ABNORMAL HIGH (ref 11.5–14.5)
WBC: 14.9 10*3/uL — AB (ref 3.8–10.6)

## 2015-08-22 MED ORDER — ACETAMINOPHEN-CODEINE #3 300-30 MG PO TABS
1.0000 | ORAL_TABLET | Freq: Four times a day (QID) | ORAL | Status: DC | PRN
  Administered 2015-08-22 – 2015-08-26 (×2): 1 via ORAL
  Filled 2015-08-22 (×2): qty 1

## 2015-08-22 MED ORDER — METHYLPREDNISOLONE SODIUM SUCC 40 MG IJ SOLR
40.0000 mg | Freq: Two times a day (BID) | INTRAMUSCULAR | Status: DC
  Administered 2015-08-23: 40 mg via INTRAVENOUS
  Filled 2015-08-22: qty 1

## 2015-08-22 NOTE — Progress Notes (Signed)
Patient wants [place] on 7 liters, patient states he uses 8 liters at home. Informed patient that [place] run at maximum 6LPM. Patient takes BIPAP on and off himself

## 2015-08-22 NOTE — Progress Notes (Addendum)
Date: 08/22/2015,   MRN# [id number] Beverely PaceKelvin D Cerveny Sr. 04/28/1961 Code Status:     Code Status Orders        Start     Ordered   08/17/15 1455  Full code   Continuous     08/17/15 1454       HPI: Increase shortness of breath, occasional wheezes, feeling weak. C/o the bipap is trying him out.  PMHX:   Past Medical History  Diagnosis Date  . Atrial fibrillation (HCC)   . Hypertension   . Tuberculosis   . Sleep apnea   . COPD (chronic obstructive pulmonary disease) (HCC)     on 5l home oxygen  . Obesity   . Gout   . Hyperlipidemia   . Acute exacerbation of COPD with asthma (HCC) 08/20/2015  . Acute on chronic diastolic CHF (congestive heart failure) (HCC) 08/20/2015   Surgical Hx:  Past Surgical History  Procedure Laterality Date  . Bronchoscopy     Family Hx:  Family History  Problem Relation Age of Onset  . Cancer Mother   . Emphysema Father    Social Hx:   Social History  Substance Use Topics  . Smoking status: Never Smoker   . Smokeless tobacco: None  . Alcohol Use: Yes   Medication:    Home Medication:  No current outpatient prescriptions on file.  Current Medication: @CURMEDTAB @   Allergies:  Review of patient's allergies indicates no known allergies.  Review of Systems: Gen:  Denies  fever, sweats, chills HEENT: Denies blurred vision, double vision, ear pain, eye pain, hearing loss, nose bleeds, sore throat Cvc:  No dizziness, chest pain or heaviness Resp:   C/o dyspnea on exertion, occasional wheeze, feeling weak  Gi: Denies swallowing difficulty, stomach pain, nausea or vomiting, diarrhea, constipation, bowel incontinence Gu:  Denies bladder incontinence, burning urine Ext:   No Joint pain, stiffness or swelling Skin: No skin rash, easy bruising or bleeding or hives Endoc:  No polyuria, polydipsia , polyphagia or weight change Psych: No depression, insomnia or hallucinations  Other:  All other systems negative  Physical Examination:    VS: BP 106/66 mmHg  Pulse 79  Temp(Src) 96.5 F (35.8 C) (Axillary)  Resp 19  Ht 5\' 11"  (1.803 m)  Wt 321 lb 14 oz (146 kg)  BMI 44.91 kg/m2  SpO2 95%  General Appearance: No distress  Neuro: without focal findings, mental status, speech normal, alert and oriented, cranial nerves 2-12 intact, reflexes normal and symmetric, sensation grossly normal  HEENT: PERRLA, EOM intact, no ptosis, no other lesions noticed, Mallampati: Pulmonary:.No wheezing, No rales  Sputum Production:   Cardiovascular:  Normal S1,S2.  No m/r/g.  Abdominal aorta pulsation normal.    Abdomen:Benign, Soft, non-tender, No masses, hepatosplenomegaly, No lymphadenopathy Endoc: No evident thyromegaly, no signs of acromegaly or Cushing features Skin:   warm, no rashes, no ecchymosis  Extremities: normal, no cyanosis, clubbing, no edema, warm with normal capillary refill. Other findings:   Labs results:   Recent Labs     08/21/15  0401  08/22/15  0413  HGB  15.3  15.2  HCT  48.3  46.9  MCV  86.5  86.3  WBC  14.2*  14.9*  BUN  66*  68*  CREATININE  1.68*  1.69*  GLUCOSE  124*  132*  CALCIUM  9.0  8.8*  ,   Rad results:  CLINICAL DATA: Shortness of breath, COPD, CHF  EXAM: PORTABLE CHEST 1 VIEW  COMPARISON: 02/28/2015  FINDINGS: Limited exam because of portable technique and body habitus. Cardiomegaly evident with diffuse increased interstitial type opacities concerning for edema. Early CHF is favored. Low lung volumes with basilar atelectasis. No large effusion or pneumothorax. Trachea midline.  IMPRESSION: Cardiomegaly with mild early interstitial edema pattern.   Electronically Signed  By: Judie Petit. Shick M.D.  On: 08/17/2015 12:26      Assessment and Plan: Mr. Chmiel is here for increase sob, low sats, in pulmonary edema (hx of diastolic dysfunction) and copd exacerabation. Close to his chronic dyspnea at this time. Some what deconditioned -following cardiology recs -diuresis,  following renal parameters (increase bun/cr ratio) -wean o2 as tolerated -resume pre admit copd regimen -solumedrol 40 mg iv q 12 hrs (start to wean steroids) -may be able to go to the floor -physical tx   Obstructive sleep apnea -bipap during sleep (ok to use his system and settings) with humidification -weight loss  S/p Tb, s/p completed treatment -no further treatment  I have personally obtained a history, examined the patient, evaluated laboratory and imaging results, formulated the assessment and plan and placed orders.  The Patient requires high complexity decision making for assessment and support, frequent evaluation and titration of therapies, application of advanced monitoring technologies and extensive interpretation of multiple databases.   Betsie Peckman,M.D. Pulmonary & Critical care Medicine [facility]

## 2015-08-22 NOTE — Progress Notes (Addendum)
Nutrition Follow-up   INTERVENTION:   Meals and Snacks: Cater to patient preferences   NUTRITION DIAGNOSIS:   Food and nutrition related knowledge deficit related to chronic illness as evidenced by  (pt with questions regarding sodium intake).  GOAL:   Patient will meet greater than or equal to 90% of their needs  MONITOR:    (Energy intake, Electrolyte and renal profile)  REASON FOR ASSESSMENT:   Malnutrition Screening Tool    ASSESSMENT:    Pt on bipap in ICU now transferred to the floor on nasal cannula, RT in to do breathing treatment. Pt c/o tightness when swallowing hamburger since yesterday and has been eating softer foods such as broccoli cheese soup which reports tolerating well. RN aware and has been monitoring. Pt reports weakness of lower extremities.  Diet Order:  Diet Heart Room service appropriate?: Yes; Fluid consistency:: Thin   Current Nutrition: Pt ate 90% of lunch today on visit, sandwich and yogurt. Pt eating 88% of meals recorded on average since assessment.     Gastrointestinal Profile: Last BM: 08/22/2015   Scheduled Medications:  . allopurinol  300 mg Oral Daily  . antiseptic oral rinse  7 mL Mouth Rinse BID  . atenolol  50 mg Oral Daily  . dabigatran  150 mg Oral Q12H  . diltiazem  240 mg Oral Daily  . fluticasone  2 spray Each Nare Daily  . ipratropium-albuterol  3 mL Nebulization Q6H  . levofloxacin  500 mg Oral Daily  . methylPREDNISolone (SOLU-MEDROL) injection  60 mg Intravenous Q12H  . mometasone-formoterol  2 puff Inhalation BID  . pantoprazole  40 mg Oral Daily  . pravastatin  20 mg Oral q1800  . senna  1 tablet Oral BID  . sodium chloride  3 mL Intravenous Q12H     Electrolyte/Renal Profile and Glucose Profile:   Recent Labs Lab 08/19/15 0407 08/21/15 0401 08/22/15 0413  NA 135 135 134*  K 4.1 3.7 3.8  CL 92* 93* 93*  CO2 30 32 31  BUN 61* 66* 68*  CREATININE 1.77* 1.68* 1.69*  CALCIUM 9.1 9.0 8.8*  GLUCOSE 121*  124* 132*   Protein Profile:  Recent Labs Lab 08/19/15 0407 08/21/15 0401 08/22/15 0413  ALBUMIN 3.3* 3.2* 3.3*     Weight Trend since Admission: Filed Weights   08/17/15 1458  Weight: 321 lb 14 oz (146 kg)    BMI:  Body mass index is 44.91 kg/(m^2).  Estimated Nutritional Needs:   Kcal:  Using IBW of 78kg BEE 1632 kcals (IF 1.0-1.2, AF 1.3) 2121-2545 kcals/d  Protein:  Using IBW of 78kg (1.0-1.2 g/kg) 78-94 g/d   Fluid:  Using IBW of 78kg (30-3735ml/kg) 2340-279365ml/d  EDUCATION NEEDS:   Education needs addressed last visit   LOW Care Level  Leda QuailAllyson Jashira Cotugno, RD, LDN Pager [phone]

## 2015-08-22 NOTE — Progress Notes (Signed)
Subjective: Remains very SOB with any activity, though at rest his symptoms have improved.  Sats remain stable.  No fever. No chest pain or palpitations.  Objective: Vital signs in last 24 hours: Temp:  [97.4 F (36.3 C)-98 F (36.7 C)] 97.5 F (36.4 C) (10/31 1151) Pulse Rate:  [80-94] 94 (10/31 1151) Resp:  [18-22] 20 (10/31 1151) BP: (102-123)/(37-82) 114/79 mmHg (10/31 1151) SpO2:  [94 %-97 %] 97 % (10/31 1151) FiO2 (%):  [40 %] 40 % (10/31 0227) Weight change:  Last BM Date: 08/20/15  Intake/Output from previous day: 10/30 0701 - 10/31 0700 In: 483 [P.O.:480; I.V.:3] Out: 2420 [Urine:2420] Intake/Output this shift: Total I/O In: -  Out: 525 [Urine:525]  GENERAL:alert, oriented, no acute distress.  Oxygen by nasal cannula in use.  HEENT: PERRL; no pallor, no icterus.  NECK: supple. No thyroid enlargement, no tenderness.  LUNGS: scattered diffuse inspiratory and expiratory rhonchi, bibasilar crackles, good air entry bilaterally. No use of accessory muscles.  CARDIOVASCULAR: irregular rhythm, S1, S2, + systolic murmur.  ABDOMEN: Soft, non-tender, normal bowel sounds, no organomegaly or mass.  EXTREMITIES: 2+ bilateral pitting lower leg edema. No cyanosis or clubbing.  NEUROLOGIC: CN appear grossly intact; moves all extremities. SKIN: No sig rashes or nodules LYMPH: no cervical or supraclavicular lymphadenopathy  Lab Results:  Recent Labs  08/21/15 0401 08/22/15 0413  WBC 14.2* 14.9*  HGB 15.3 15.2  HCT 48.3 46.9  PLT 162 156   BMET  Recent Labs  08/21/15 0401 08/22/15 0413  NA 135 134*  K 3.7 3.8  CL 93* 93*  CO2 32 31  GLUCOSE 124* 132*  BUN 66* 68*  CREATININE 1.68* 1.69*  CALCIUM 9.0 8.8*    Studies/Results: No results found.  Medications:  Scheduled Meds: . allopurinol  300 mg Oral Daily  . antiseptic oral rinse  7 mL Mouth Rinse BID  . atenolol  50 mg Oral Daily  . dabigatran  150 mg Oral Q12H  . diltiazem  240 mg Oral Daily  .  fluticasone  2 spray Each Nare Daily  . ipratropium-albuterol  3 mL Nebulization Q6H  . levofloxacin  500 mg Oral Daily  . methylPREDNISolone (SOLU-MEDROL) injection  60 mg Intravenous Q12H  . mometasone-formoterol  2 puff Inhalation BID  . pantoprazole  40 mg Oral Daily  . pravastatin  20 mg Oral q1800  . senna  1 tablet Oral BID  . sodium chloride  3 mL Intravenous Q12H    Assessment/Plan: 54 year old male presented with acute onset shortness of breath and hypoxia.  History of chronic atrial fibrillation and multiple pulmonary problems including pulmonary tuberculosis, advanced fibrosis, cavitations, chronic obstructive pulmonary disease, obstructive sleep apnea and hypoxia.  Patient is closely followed by Dr. Meredeth IdeFleming and Dr. Darrold JunkerParaschos. He uses supplemental oxygen by nasal cannula at 5-8 L. Patient was started on BiPAP in the emergency room  Patient was initially admitted to CCU for further management, transferred to the floor over the weekend.    Acute on chronic hypoxic respiratory failure due to CHF/Acute exacerbation of COPD: improving, still has significant dyspnea with minimal exertion, continue IV steroids, Duoneb and Dulera. Incentive spirometry.  Acute on chronic diastolic congestive heart failure/chronic atrial fibrillation with controlled ventricular response:  On Pradaxa, given coagulopathy on coumadin. Lasix was held for rising BUN.  Watch for fluid overload.  Coagulopathy: corrected with Vit K and fresh frozen plasma. Now on Pradaxa; following CBC periodically  Obstructive sleep apnea:  Use BiPap whenever sleeping  GI prophylaxis  with Protonix. DVT prophylaxis with Pradaxa  Discussed with patient and nursing.     Zyhir Cappella 08/22/2015, 1:31 PM

## 2015-08-23 LAB — CULTURE, BLOOD (ROUTINE X 2)
Culture: NO GROWTH
Culture: NO GROWTH

## 2015-08-23 MED ORDER — POTASSIUM CHLORIDE CRYS ER 10 MEQ PO TBCR
10.0000 meq | EXTENDED_RELEASE_TABLET | Freq: Every day | ORAL | Status: DC
  Administered 2015-08-23 – 2015-08-26 (×4): 10 meq via ORAL
  Filled 2015-08-23 (×4): qty 1

## 2015-08-23 MED ORDER — FUROSEMIDE 40 MG PO TABS
40.0000 mg | ORAL_TABLET | Freq: Two times a day (BID) | ORAL | Status: DC
  Administered 2015-08-23 – 2015-08-25 (×5): 40 mg via ORAL
  Filled 2015-08-23 (×5): qty 1

## 2015-08-23 MED ORDER — PREDNISONE 20 MG PO TABS
20.0000 mg | ORAL_TABLET | Freq: Two times a day (BID) | ORAL | Status: DC
  Administered 2015-08-24 – 2015-08-25 (×3): 20 mg via ORAL
  Filled 2015-08-23 (×3): qty 1

## 2015-08-23 NOTE — Progress Notes (Addendum)
Pt c/o coughing up blood clots one on day shift and one on this shift, patient states that "this is the second time that  It happens, one during the day and one right now" pt also states that he believes is "probably dryness but at the same time I don't know what it might be" Pt has no c/o SOB, chest pain, or any other discomfort at this time. VS are WNL. MD[facility] p[facility].

## 2015-08-23 NOTE — Progress Notes (Signed)
Subjective: Remains very SOB with any activity, though at rest his symptoms have improved.  Sats remain stable.  No fever. No chest pain or palpitations.  Objective: Vital signs in last 24 hours: Temp:  [96.5 F (35.8 C)-97.8 F (36.6 C)] 97.2 F (36.2 C) (11/01 0803) Pulse Rate:  [79-87] 85 (11/01 0803) Resp:  [19-24] 24 (11/01 0803) BP: (106-119)/(66-95) 119/77 mmHg (11/01 0803) SpO2:  [94 %-97 %] 96 % (11/01 0859) FiO2 (%):  [40 %] 40 % (11/01 0207) Weight change:  Last BM Date: 08/22/15  Intake/Output from previous day: 10/31 0701 - 11/01 0700 In: 600 [P.O.:600] Out: 2325 [Urine:2325] Intake/Output this shift: Total I/O In: -  Out: 300 [Urine:300]  GENERAL:alert, oriented, no acute distress.  Oxygen by nasal cannula in use.  HEENT: PERRL; no pallor, no icterus.  NECK: supple. No thyroid enlargement, no tenderness.  LUNGS: scattered diffuse inspiratory and expiratory rhonchi, bibasilar crackles, good air entry bilaterally. No use of accessory muscles.  CARDIOVASCULAR: irregular rhythm, S1, S2, + systolic murmur.  ABDOMEN: Soft, non-tender, normal bowel sounds, no organomegaly or mass.  EXTREMITIES: 2+ bilateral pitting lower leg edema. No cyanosis or clubbing.  NEUROLOGIC: CN appear grossly intact; moves all extremities. SKIN: No sig rashes or nodules LYMPH: no cervical or supraclavicular lymphadenopathy  Lab Results:  Recent Labs  08/21/15 0401 08/22/15 0413  WBC 14.2* 14.9*  HGB 15.3 15.2  HCT 48.3 46.9  PLT 162 156   BMET  Recent Labs  08/21/15 0401 08/22/15 0413  NA 135 134*  K 3.7 3.8  CL 93* 93*  CO2 32 31  GLUCOSE 124* 132*  BUN 66* 68*  CREATININE 1.68* 1.69*  CALCIUM 9.0 8.8*    Studies/Results: No results found.  Medications:  Scheduled Meds: . allopurinol  300 mg Oral Daily  . antiseptic oral rinse  7 mL Mouth Rinse BID  . atenolol  50 mg Oral Daily  . dabigatran  150 mg Oral Q12H  . diltiazem  240 mg Oral Daily  .  fluticasone  2 spray Each Nare Daily  . furosemide  40 mg Oral BID  . ipratropium-albuterol  3 mL Nebulization Q6H  . levofloxacin  500 mg Oral Daily  . methylPREDNISolone (SOLU-MEDROL) injection  40 mg Intravenous Q12H  . mometasone-formoterol  2 puff Inhalation BID  . pantoprazole  40 mg Oral Daily  . potassium chloride  10 mEq Oral Daily  . pravastatin  20 mg Oral q1800  . senna  1 tablet Oral BID  . sodium chloride  3 mL Intravenous Q12H    Assessment/Plan: 54 year old male presented with acute onset shortness of breath and hypoxia.  History of chronic atrial fibrillation and multiple pulmonary problems including pulmonary tuberculosis, advanced fibrosis, cavitations, chronic obstructive pulmonary disease, obstructive sleep apnea and hypoxia.  Patient is closely followed by Dr. Meredeth Ide and Dr. Darrold Junker. He uses supplemental oxygen by nasal cannula at 5-8 L. Patient was started on BiPAP in the emergency room  Patient was initially admitted to CCU for further management, transferred to the floor over the weekend.    Acute on chronic hypoxic respiratory failure due to CHF/Acute exacerbation of COPD: currently on BiPAP. Improving gradually, still has significant dyspnea with minimal exertion, taper IV steroid, continue Duoneb and Dulera. Incentive spirometry. Pulmonary follow up noted.  Acute on chronic diastolic congestive heart failure/chronic atrial fibrillation with controlled ventricular response: continue Pradaxa and rest of cardiac medications. Resume Lasix, monitor I/Os, lytes and creatinine.   Obstructive sleep apnea: sleeps well  with BiPAP.  GI prophylaxis with Protonix.  DVT prophylaxis with Pradaxa  Discussed with patient and nursing.     Moo Gravley 08/23/2015, 11:59 AM

## 2015-08-23 NOTE — Progress Notes (Signed)
Date: 08/23/2015,   MRN# [id number] Jonathan PAINO Sr. Mar 03, 1961 Code Status:     Code Status Orders        Start     Ordered   08/17/15 1455  Full code   Continuous     08/17/15 1454      HPI: Weak, sob no worse, D/C plans in progress.    PMHX:   Past Medical History  Diagnosis Date  . Atrial fibrillation (HCC)   . Hypertension   . Tuberculosis   . Sleep apnea   . COPD (chronic obstructive pulmonary disease) (HCC)     on 5l home oxygen  . Obesity   . Gout   . Hyperlipidemia   . Acute exacerbation of COPD with asthma (HCC) 08/20/2015  . Acute on chronic diastolic CHF (congestive heart failure) (HCC) 08/20/2015   Surgical Hx:  Past Surgical History  Procedure Laterality Date  . Bronchoscopy     Family Hx:  Family History  Problem Relation Age of Onset  . Cancer Mother   . Emphysema Father    Social Hx:   Social History  Substance Use Topics  . Smoking status: Never Smoker   . Smokeless tobacco: None  . Alcohol Use: Yes   Medication:    Home Medication:  No current outpatient prescriptions on file.  Current Medication: @   Allergies:  Review of patient's allergies indicates no known allergies.  Review of Systems: Gen:  Denies  fever, sweats, chills HEENT: Denies blurred vision, double vision, ear pain, eye pain, hearing loss, nose bleeds, sore throat Cvc:  No dizziness, chest pain or heaviness Resp:  Increase dyspnea with activity,   Gi: Denies swallowing difficulty, stomach pain, nausea or vomiting, diarrhea, constipation, bowel incontinence Gu:  Denies bladder incontinence, burning urine Ext:   No Joint pain, stiffness or swelling Skin: No skin rash, easy bruising or bleeding or hives Endoc:  No polyuria, polydipsia , polyphagia or weight change Psych: No depression, insomnia or hallucinations  Other:  All other systems negative  Physical Examination:   VS: BP 111/72 mmHg  Pulse 90  Temp(Src) 97.4 F (36.3 C) (Oral)  Resp 20  Ht   (1.803 m)  Wt 321 lb 14 oz (146 kg)  BMI 44.91 kg/m2  SpO2 95%  General Appearance: No distress Justice at 6 liters, very over weight Neuro/psych without focal findings, mental status, speech normal, alert and oriented, cranial nerves 2-12 intact, reflexes normal and symmetric, sensation grossly normal  HEENT: PERRLA, EOM intact, no ptosis, no other lesions noticed Neck: short, thick, no stridor Pulmonary:.No wheezing, No rales  Sputum Production:   Cardiovascular:  Normal S1,S2.  No m/r/g.  Abdominal aorta pulsation normal.    Abdomen:Benign, Soft, non-tender, No masses, hepatosplenomegaly, No lymphadenopathy Endoc: No evident thyromegaly, no signs of acromegaly or Cushing features Skin:   warm, no rashes, no ecchymosis  Extremities: normal, no cyanosis, clubbing, no edema, warm with normal capillary refill. Other findings:   Labs results:   Recent Labs     08/21/15  0401  08/22/15  0413  HGB  15.3  15.2  HCT  48.3  46.9  MCV  86.5  86.3  WBC  14.2*  14.9*  BUN  66*  68*  CREATININE  1.68*  1.69*  GLUCOSE  124*  132*  CALCIUM  9.0  8.8*  ,       Rad results:  CLINICAL DATA: Shortness of breath, COPD, CHF  EXAM: PORTABLE CHEST 1 VIEW  COMPARISON: 02/28/2015  FINDINGS: Limited exam because of portable technique and body habitus. Cardiomegaly evident with diffuse increased interstitial type opacities concerning for edema. Early CHF is favored. Low lung volumes with basilar atelectasis. No large effusion or pneumothorax. Trachea midline.  IMPRESSION: Cardiomegaly with mild early interstitial edema pattern.   Electronically Signed  By: Judie PetitM. Shick M.D.  On: 08/17/2015 12:26    Assessment and Plan: Mr. Jonathan [name] is here for increase sob, low sats, in pulmonary edema (hx of diastolic dysfunction) and copd exacerabation. Today close to his chronic dyspnea at this time. Some what deconditioned -following cardiology recs -diuresis, following renal  parameters (increase bun/cr ratio) -wean o2 as tolerated -resume pre admit copd regimen -switch to prednisone taper -may be able to go home in 24-48 hours -physical tx, home health (if he accepts)   Obstructive sleep apnea -bipap during sleep  -weight loss  S/p Tb, s/p completed treatment -following   I have personally obtained a history, examined the patient, evaluated laboratory and imaging results, formulated the assessment and plan and placed orders.  The Patient requires high complexity decision making for assessment and support, frequent evaluation and titration of therapies, application of advanced monitoring technologies and extensive interpretation of multiple databases.   Jonathan [name],M.D. Pulmonary & Critical care Medicine [facility]

## 2015-08-23 NOTE — Care Management Note (Signed)
Case Management Note  Patient Details  Name: Jonathan REVELO Sr. MRN: [id number] Date of Birth: 1961/06/14  Subjective/Objective:                  Met with patient to discuss discharge planning. He states he is open to [facility]. He also states that he is "on 8 liters of Oxygen through a high flow apparatus". He is currently on 6.5 liters per nasal cannula. He has no DME at home and requests a shower chair but declined a rolling walker- said he is independent at home usually. He lives with his wife at home. His O2 is through [facility] Patient.  Action/Plan:  I have notified [place] with [place] of patient admission. HHRN resumption of care needed. PT will not be covered due to [facility] Medicaid. Shower chair Rx requested from Dr. Keturah Barre nurse Amy. RNCM will continue to follow.   Expected Discharge Date:                  Expected Discharge Plan:     In-House Referral:     Discharge planning Services  CM Consult  Post Acute Care Choice:  Durable Medical Equipment, Home Health Choice offered to:  Patient  DME Arranged:  Shower stool DME Agency:  [place]:  RN [facility] Agency:  [place]  Status of Service:  In process, will continue to follow  Medicare Important Message Given:    Date Medicare IM Given:    Medicare IM give by:    Date Additional Medicare IM Given:    Additional Medicare Important Message give by:     If discussed at [place] of Stay Meetings, dates discussed:    Additional Comments:  Marshell Garfinkel, RN 08/23/2015, 10:17 AM

## 2015-08-24 ENCOUNTER — Inpatient Hospital Stay: Payer: Medicaid Other

## 2015-08-24 LAB — COMPREHENSIVE METABOLIC PANEL
ALK PHOS: 63 U/L (ref 38–126)
ALT: 42 U/L (ref 17–63)
ANION GAP: 9 (ref 5–15)
AST: 43 U/L — ABNORMAL HIGH (ref 15–41)
Albumin: 3.1 g/dL — ABNORMAL LOW (ref 3.5–5.0)
BUN: 63 mg/dL — ABNORMAL HIGH (ref 6–20)
CALCIUM: 8.8 mg/dL — AB (ref 8.9–10.3)
CHLORIDE: 95 mmol/L — AB (ref 101–111)
CO2: 33 mmol/L — ABNORMAL HIGH (ref 22–32)
Creatinine, Ser: 1.5 mg/dL — ABNORMAL HIGH (ref 0.61–1.24)
GFR, EST AFRICAN AMERICAN: 58 mL/min — AB (ref >60)
GFR, EST NON AFRICAN AMERICAN: 50 mL/min — AB (ref >60)
Glucose, Bld: 122 mg/dL — ABNORMAL HIGH (ref 65–99)
Potassium: 4 mmol/L (ref 3.5–5.1)
Sodium: 137 mmol/L (ref 135–145)
Total Bilirubin: 3.1 mg/dL — ABNORMAL HIGH (ref 0.3–1.2)
Total Protein: 6.1 g/dL — ABNORMAL LOW (ref 6.5–8.1)

## 2015-08-24 NOTE — Progress Notes (Signed)
Subjective: Remains very SOB with any activity. Oxygen by nasal cannula in use. No fever. No chest pain or palpitations.  Objective: Vital signs in last 24 hours: Temp:  [97.4 F (36.3 C)-97.7 F (36.5 C)] 97.5 F (36.4 C) (11/02 1121) Pulse Rate:  [84-96] 96 (11/02 1121) Resp:  [18-20] 18 (11/02 1121) BP: (106-123)/(72-88) 123/82 mmHg (11/02 1121) SpO2:  [91 %-95 %] 95 % (11/02 1121) FiO2 (%):  [40 %] 40 % (11/02 0213) Weight change:  Last BM Date: 08/22/15  Intake/Output from previous day: 11/01 0701 - 11/02 0700 In: 720 [P.O.:720] Out: 1815 [Urine:1815] Intake/Output this shift: Total I/O In: -  Out: 275 [Urine:275]  GENERAL:alert, oriented, no acute distress.  Oxygen by nasal cannula in use.  HEENT: PERRL; no pallor, no icterus.  NECK: supple. No thyroid enlargement, no tenderness.  LUNGS: scattered diffuse inspiratory and expiratory rhonchi, bibasilar crackles, good air entry bilaterally. No use of accessory muscles.  CARDIOVASCULAR: irregular rhythm, S1, S2, + systolic murmur.  ABDOMEN: Soft, non-tender, normal bowel sounds, no organomegaly or mass.  EXTREMITIES: 2-3 + bilateral pitting lower leg edema. No cyanosis or clubbing.  NEUROLOGIC: CN appear grossly intact; moves all extremities. SKIN: No sig rashes or nodules LYMPH: no cervical or supraclavicular lymphadenopathy  Lab Results:  Recent Labs  08/22/15 0413  WBC 14.9*  HGB 15.2  HCT 46.9  PLT 156   BMET  Recent Labs  08/22/15 0413 08/24/15 0653  NA 134* 137  K 3.8 4.0  CL 93* 95*  CO2 31 33*  GLUCOSE 132* 122*  BUN 68* 63*  CREATININE 1.69* 1.50*  CALCIUM 8.8* 8.8*    Studies/Results: No results found.  Medications:  Scheduled Meds: . allopurinol  300 mg Oral Daily  . antiseptic oral rinse  7 mL Mouth Rinse BID  . atenolol  50 mg Oral Daily  . dabigatran  150 mg Oral Q12H  . diltiazem  240 mg Oral Daily  . fluticasone  2 spray Each Nare Daily  . furosemide  40 mg Oral BID   . ipratropium-albuterol  3 mL Nebulization Q6H  . levofloxacin  500 mg Oral Daily  . mometasone-formoterol  2 puff Inhalation BID  . pantoprazole  40 mg Oral Daily  . potassium chloride  10 mEq Oral Daily  . pravastatin  20 mg Oral q1800  . predniSONE  20 mg Oral BID WC  . senna  1 tablet Oral BID  . sodium chloride  3 mL Intravenous Q12H    Assessment/Plan: 54 year old male presented with acute onset shortness of breath and hypoxia.  History of chronic atrial fibrillation and multiple pulmonary problems including pulmonary tuberculosis, advanced fibrosis, cavitations, chronic obstructive pulmonary disease, obstructive sleep apnea and hypoxia.  Patient is closely followed by Dr. Meredeth Ide and Dr. Darrold Junker. He uses supplemental oxygen by nasal cannula at 5-8 L. Patient was started on BiPAP in the emergency room  Patient was initially admitted to CCU for further management, transferred to the floor over the weekend.    Acute on chronic hypoxic respiratory failure due to CHF/Acute exacerbation of COPD: supplemental oxygen by nasal cannula, still has significant dyspnea with minimal exertion, continue Prednisone, Duoneb and Dulera, incentive spirometry, closely followed by Pulmonary.   Acute on chronic diastolic congestive heart failure/chronic atrial fibrillation with controlled ventricular response: continue Pradaxa and rest of cardiac medications, continue Lasix, monitor I/Os, lytes and creatinine.   Obstructive sleep apnea: sleeps well with BiPAP.  GI prophylaxis with Protonix.  DVT prophylaxis with Pradaxa  Discussed with care management, patient and nursing. Patient is not eligible for home health services due to lack of insurance. He has high flow oxygen and CPAP at home. Shall discuss with Dr. Meredeth IdeFleming.   Khaidyn Staebell 08/24/2015, 12:04 PM

## 2015-08-24 NOTE — Progress Notes (Addendum)
Notified Dr Leotis ShamesJasmine Singh that pt had run of rapid afib, HR in 140's, lasted for approximately 40 seconds  (per CCU tele clerk); Dr acknowledged, no new orders

## 2015-08-24 NOTE — Progress Notes (Signed)
Date: 08/24/2015,   MRN# [id number] Jonathan PaceKelvin D Like Sr. 03/15/1961 Code Status:     Code Status Orders        Start     Ordered   08/17/15 1455  Full code   Continuous     08/17/15 1454     HPI: chronically dyspneic, discharge planning in progress. Answered his questions.    PMHX:   Past Medical History  Diagnosis Date  . Atrial fibrillation (HCC)   . Hypertension   . Tuberculosis   . Sleep apnea   . COPD (chronic obstructive pulmonary disease) (HCC)     on 5l home oxygen  . Obesity   . Gout   . Hyperlipidemia   . Acute exacerbation of COPD with asthma (HCC) 08/20/2015  . Acute on chronic diastolic CHF (congestive heart failure) (HCC) 08/20/2015   Surgical Hx:  Past Surgical History  Procedure Laterality Date  . Bronchoscopy     Family Hx:  Family History  Problem Relation Age of Onset  . Cancer Mother   . Emphysema Father    Social Hx:   Social History  Substance Use Topics  . Smoking status: Never Smoker   . Smokeless tobacco: None  . Alcohol Use: Yes   Medication:    Home Medication:  No current outpatient prescriptions on file.  Current Medication: @CURMEDTAB @   Allergies:  Review of patient's allergies indicates no known allergies.  Review of Systems: Gen:  Denies  fever, sweats, chills HEENT: Denies blurred vision, double vision, ear pain, eye pain, hearing loss, nose bleeds, sore throat Cvc:  No dizziness, chest pain or heaviness Resp:  Dyspnea on exertion  Gi: Denies swallowing difficulty, stomach pain, nausea or vomiting, diarrhea, constipation, bowel incontinence Gu:  Denies bladder incontinence, burning urine Ext:   No Joint pain, stiffness or swelling Skin: No skin rash, easy bruising or bleeding or hives Endoc:  No polyuria, polydipsia , polyphagia or weight change Psych: No depression, insomnia or hallucinations  Other:  All other systems negative  Physical Examination:   VS: BP 116/72 mmHg  Pulse 78  Temp(Src) 97.6 F (36.4 C)  (Oral)  Resp 32  Ht 5\' 11"  (1.803 m)  Wt 321 lb 14 oz (146 kg)  BMI 44.91 kg/m2  SpO2 93%  General Appearance: No distress  Neuropsych without focal findings, mental status, speech normal, alert and oriented, cranial nerves 2-12 intact, reflexes normal and symmetric, sensation grossly normal  HEENT: PERRLA, EOM intact, no ptosis, no other lesions noticed Neck; Supple short and thick Pulmonary:.No wheezing, No rales  distant   Cardiovascular:  Normal S1,S2.  No m/r/g.      Abdomen:Benign, Soft, non-tender, No masses, hepatosplenomegaly, No lymphadenopathy Endoc: No evident thyromegaly, no signs of acromegaly or Cushing features Skin:   warm, no rashes, no ecchymosis  Extremities: normal, no cyanosis, clubbing, no edema, warm with normal capillary refill.   Labs results:   Recent Labs     08/22/15  0413  08/24/15  0653  HGB  15.2   --   HCT  46.9   --   MCV  86.3   --   WBC  14.9*   --   BUN  68*  63*  CREATININE  1.69*  1.50*  GLUCOSE  132*  122*  CALCIUM  8.8*  8.8*  ,   Assessment and Plan: Mr. Jonathan [name] is here for increase sob, low sats, in pulmonary edema (hx of diastolic dysfunction) and copd exacerabation. Today close to his  chronic dyspnea at this time. Some what deconditioned. -following cardiology recs -wean o2 as tolerated -resume pre admit regimen -wean prednisone to 10 mg q day and maintain there -follow up in 2 weeks post discharge  Obstructive sleep apnea -bipap during sleep  -weight loss  S/p Tb, s/p completed treatment -following  I have personally obtained a history, examined the patient, evaluated laboratory and imaging results, formulated the assessment and plan and placed orders.  The Patient requires high complexity decision making for assessment and support, frequent evaluation and titration of therapies, application of advanced monitoring technologies and extensive interpretation of multiple databases.   Vaeda Westall,M.D. Pulmonary &  Critical care Medicine [facility]

## 2015-08-24 NOTE — Care Management (Addendum)
I am having Medicaid verified as I'm told it is inactive by Will with [facility] when he attempted to charge patient for shower chair that has been delivered to patient. Also, Dr. Meredeth IdeFleming stated that patient is turning his O2 up at home (self-adjusting) and patient states he is on "high-flow" at home. Notified by Dr. Meredeth IdeFleming that patient needs to walk- RN notified yesterday. Per Dr. Meredeth IdeFleming patient is at his pulmonary baseline. He is currently on 6.5 liters of O2 here but uses over 8 L at home. Per Sherrilyn RistKari with [facility] patient [facility] but [facility] start care with him in the home. New [facility] and [facility] said they would try again but they are not currently open to his case. If he doesn't have health insurance he will have to pay privately [facility]. [facility] will not be covered regardless.   It has been confirmed that patient does not have health insurance. Patient made aware. He was not aware that his insurance terminated and will contact his case worker. Referral sent to [facility] for follow up appointment. Applications for [facility] and [facility] delivered to patient.

## 2015-08-25 MED ORDER — FUROSEMIDE 10 MG/ML IJ SOLN
40.0000 mg | Freq: Two times a day (BID) | INTRAMUSCULAR | Status: DC
  Administered 2015-08-25 – 2015-08-26 (×2): 40 mg via INTRAVENOUS
  Filled 2015-08-25 (×2): qty 4

## 2015-08-25 MED ORDER — METHYLPREDNISOLONE SODIUM SUCC 125 MG IJ SOLR
60.0000 mg | Freq: Two times a day (BID) | INTRAMUSCULAR | Status: DC
  Administered 2015-08-25 – 2015-08-26 (×3): 60 mg via INTRAVENOUS
  Filled 2015-08-25 (×3): qty 2

## 2015-08-25 MED ORDER — TRIAMCINOLONE ACETONIDE 55 MCG/ACT NA AERO: 2.0000 | INHALATION_SPRAY | Freq: Every day | NASAL | Status: DC

## 2015-08-25 MED ORDER — FLUTICASONE PROPIONATE 50 MCG/ACT NA SUSP
2.0000 | Freq: Every day | NASAL | Status: DC
  Filled 2015-08-25: qty 16

## 2015-08-25 MED ORDER — BUDESONIDE 0.25 MG/2ML IN SUSP
0.2500 mg | Freq: Two times a day (BID) | RESPIRATORY_TRACT | Status: DC
  Administered 2015-08-25 – 2015-08-26 (×3): 0.25 mg via RESPIRATORY_TRACT
  Filled 2015-08-25 (×3): qty 2

## 2015-08-25 NOTE — Progress Notes (Signed)
Date: 08/25/2015,   MRN# [id number] Jonathan [name]. 10/02/1961 Code Status:     Code Status Orders        Start     Ordered   08/17/15 1455  Full code   Continuous     08/17/15 1454     Hosp day:@LENGTHOFSTAYDAYS @ Referring MD: @ATDPROV @      HPI: He anticipate going home tomorrow, d/c plans in progress.   PMHX:   Past Medical History  Diagnosis Date  . Atrial fibrillation (HCC)   . Hypertension   . Tuberculosis   . Sleep apnea   . COPD (chronic obstructive pulmonary disease) (HCC)     on 5l home oxygen  . Obesity   . Gout   . Hyperlipidemia   . Acute exacerbation of COPD with asthma (HCC) 08/20/2015  . Acute on chronic diastolic CHF (congestive heart failure) (HCC) 08/20/2015   Surgical Hx:  Past Surgical History  Procedure Laterality Date  . Bronchoscopy     Family Hx:  Family History  Problem Relation Age of Onset  . Cancer Mother   . Emphysema Father    Social Hx:   Social History  Substance Use Topics  . Smoking status: Never Smoker   . Smokeless tobacco: None  . Alcohol Use: Yes   Medication:    Home Medication:  No current outpatient prescriptions on file.  Current Medication: @CURMEDTAB @   Allergies:  Review of patient's allergies indicates no known allergies.  Review of Systems: Gen:  Denies  fever, sweats, chills HEENT: Denies blurred vision, double vision, ear pain, eye pain, hearing loss, nose bleeds, sore throat Cvc:  No dizziness, chest pain or heaviness Resp:  Less sob, less leg edema  Gi: Denies swallowing difficulty, stomach pain, nausea or vomiting, diarrhea, constipation, bowel incontinence Gu:  Denies bladder incontinence, burning urine Ext:   No Joint pain, stiffness or swelling Skin: No skin rash, easy bruising or bleeding or hives Endoc:  No polyuria, polydipsia , polyphagia or weight change Psych: No depression, insomnia or hallucinations  Other:  All other systems negative  Physical Examination:   VS: BP 95/71 mmHg   Pulse 91  Temp(Src) 97.5 F (36.4 C) (Oral)  Resp 17  Ht 5\' 11"  (1.803 m)  Wt 321 lb 14 oz (146 kg)  BMI 44.91 kg/m2  SpO2 96%  General Appearance: No distress  Neuro: without focal findings, mental status, speech normal, alert and oriented, cranial nerves 2-12 intact, reflexes normal and symmetric, sensation grossly normal  HEENT: PERRLA, EOM intact, no ptosis, no other lesions noticed NECK: Supple, no stridor Pulmonary:.No wheezing, decrease  rales     Cardiovascular:  Normal S1,S2.  No m/r/g.   Abdomen:Benign, Soft, non-tender, No masses, hepatosplenomegaly, No lymphadenopathy Endoc: No evident thyromegaly, no signs of acromegaly or Cushing features Skin:   warm, no rashes, no ecchymosis  Extremities: normal, no cyanosis, clubbing, no edema, warm with normal capillary refill.    Labs results:   Recent Labs     08/24/15  0653  BUN  63*  CREATININE  1.50*  GLUCOSE  122*  CALCIUM  8.8*  ,   Rad results:  CLINICAL DATA: Shortness of breath, dyspnea on exertion, atrial fibrillation, hypertension, COPD, acute on chronic diastolic CHF, personal history of tuberculosis  EXAM: CHEST 1 VIEW  COMPARISON: Portable exam 1646 hours compared 08/17/2015  FINDINGS: Enlargement of cardiac silhouette with pulmonary vascular congestion.  Stable mediastinal contours.  Decreased lung volumes.  Patchy BILATERAL infiltrates slightly greater  on LEFT than RIGHT, favor pulmonary edema.  No pleural effusion or pneumothorax.  IMPRESSION: Enlargement of cardiac silhouette with pulmonary vascular congestion and probable persistent pulmonary edema.  Little interval change.   Electronically Signed  By: Ulyses Southward M.D.  On: 08/24/2015 16:58    Assessment and Plan: Mr. Splawn is here for increase sob, low sats, in pulmonary edema (hx of diastolic dysfunction) and copd exacerabation. Today close to his chronic dyspnea at this time. Some what deconditioned. Felt  better today after increase solumedrol dose   -watch salt and fluid intake -wean o2 as tolerated -resume pre admit regimen -wean prednisone 60 to  10 mg q day and maintain there -follow up in 2 weeks post discharge  Obstructive sleep apnea -bipap during sleep  -weight loss   I have personally obtained a history, examined the patient, evaluated laboratory and imaging results, formulated the assessment and plan and placed orders.  The Patient requires high complexity decision making for assessment and support, frequent evaluation and titration of therapies, application of advanced monitoring technologies and extensive interpretation of multiple databases.   Judye Lorino,M.D. Pulmonary & Critical care Medicine [facility]

## 2015-08-25 NOTE — Progress Notes (Signed)
Subjective: Still complains of feeling very SOB with minimal activity. Oxygen by nasal cannula in use. No fever. No chest pain or palpitations.  Objective: Vital signs in last 24 hours: Temp:  [96.9 F (36.1 C)-97.8 F (36.6 C)] 97.6 F (36.4 C) (11/03 0408) Pulse Rate:  [49-97] 97 (11/03 1112) Resp:  [17-32] 17 (11/03 0803) BP: (107-126)/(72-82) 117/78 mmHg (11/03 1112) SpO2:  [93 %-98 %] 95 % (11/03 1112) FiO2 (%):  [40 %] 40 % (11/03 0205) Weight change:  Last BM Date: 08/22/15  Intake/Output from previous day: 11/02 0701 - 11/03 0700 In: 480 [P.O.:480] Out: 2025 [Urine:2025] Intake/Output this shift: Total I/O In: 240 [P.O.:240] Out: -   GENERAL:alert, oriented, no acute distress.  Oxygen by nasal cannula in use.  HEENT: PERRL; no pallor, no icterus.  NECK: supple. No thyroid enlargement, no tenderness.  LUNGS: scattered diffuse inspiratory and expiratory rhonchi, bibasilar crackles, good air entry bilaterally. No use of accessory muscles.  CARDIOVASCULAR: irregular rhythm, S1, S2, + systolic murmur.  ABDOMEN: Soft, non-tender, normal bowel sounds, no organomegaly or mass.  EXTREMITIES: 2-3 + bilateral pitting lower leg edema. No cyanosis or clubbing.  NEUROLOGIC: CN appear grossly intact; moves all extremities. SKIN: No sig rashes or nodules LYMPH: no cervical or supraclavicular lymphadenopathy  Lab Results: No results for input(s): WBC, HGB, HCT, PLT in the last 72 hours. BMET  Recent Labs  08/24/15 0653  NA 137  K 4.0  CL 95*  CO2 33*  GLUCOSE 122*  BUN 63*  CREATININE 1.50*  CALCIUM 8.8*    Studies/Results: Dg Chest 1 View  08/24/2015  CLINICAL DATA:  Shortness of breath, dyspnea on exertion, atrial fibrillation, hypertension, COPD, acute on chronic diastolic CHF, personal history of tuberculosis EXAM: CHEST 1 VIEW COMPARISON:  Portable exam 1646 hours compared 08/17/2015 FINDINGS: Enlargement of cardiac silhouette with pulmonary vascular  congestion. Stable mediastinal contours. Decreased lung volumes. Patchy BILATERAL infiltrates slightly greater on LEFT than RIGHT, favor pulmonary edema. No pleural effusion or pneumothorax. IMPRESSION: Enlargement of cardiac silhouette with pulmonary vascular congestion and probable persistent pulmonary edema. Little interval change. Electronically Signed   By: Ulyses Southward M.D.   On: 08/24/2015 16:58    Medications:  Scheduled Meds: . allopurinol  300 mg Oral Daily  . antiseptic oral rinse  7 mL Mouth Rinse BID  . atenolol  50 mg Oral Daily  . budesonide (PULMICORT) nebulizer solution  0.25 mg Nebulization BID  . dabigatran  150 mg Oral Q12H  . diltiazem  240 mg Oral Daily  . fluticasone  2 spray Each Nare Daily  . furosemide  40 mg Oral BID  . ipratropium-albuterol  3 mL Nebulization Q6H  . levofloxacin  500 mg Oral Daily  . methylPREDNISolone (SOLU-MEDROL) injection  60 mg Intravenous Q12H  . mometasone-formoterol  2 puff Inhalation BID  . pantoprazole  40 mg Oral Daily  . potassium chloride  10 mEq Oral Daily  . pravastatin  20 mg Oral q1800  . senna  1 tablet Oral BID  . sodium chloride  3 mL Intravenous Q12H    Assessment/Plan: 54 year old male presented with acute onset shortness of breath and hypoxia.  History of chronic atrial fibrillation and multiple pulmonary problems including pulmonary tuberculosis, advanced fibrosis, cavitations, chronic obstructive pulmonary disease, obstructive sleep apnea and hypoxia.  Patient is closely followed by Dr. Meredeth Ide and Dr. Darrold Junker. He uses supplemental oxygen by nasal cannula at 5-8 L at baseline. Patient was started on BiPAP in the emergency room  Patient was initially admitted to CCU for further management, transferred to the floor over the weekend.    Acute on chronic hypoxic respiratory failure due to CHF/Acute exacerbation of COPD: feels worse today, resume Solumedrol, start Budesonide nebulizer treatments, continue Duoneb. Dulera and  supplemental oxygen by nasal cannula. Incentive spirometry. Patient is closely followed by Dr. Meredeth IdeFleming.   Acute on chronic diastolic congestive heart failure/chronic atrial fibrillation with controlled ventricular response: Icontinue Pradaxa and rest of cardiac medications. Good urine output with Lasix. Shall monitor I/Os, lytes and creatinine.   Obstructive sleep apnea: sleeps well with BiPAP.  GI prophylaxis with Protonix.  DVT prophylaxis with Pradaxa  Discussed with patient and nursing. Patient is not eligible for home health services due to lack of insurance, per care management. He has high flow oxygen and CPAP at home. Shall discuss further with Dr. Meredeth IdeFleming later today.   Herndon Grill 08/25/2015, 12:51 PM

## 2015-08-26 LAB — CBC WITH DIFFERENTIAL/PLATELET
Basophils Absolute: 0 10*3/uL (ref 0–0.1)
Basophils Relative: 0 %
EOS ABS: 0 10*3/uL (ref 0–0.7)
Eosinophils Relative: 0 %
HEMATOCRIT: 49.2 % (ref 40.0–52.0)
HEMOGLOBIN: 15.7 g/dL (ref 13.0–18.0)
LYMPHS ABS: 0.2 10*3/uL — AB (ref 1.0–3.6)
Lymphocytes Relative: 1 %
MCH: 27.9 pg (ref 26.0–34.0)
MCHC: 31.9 g/dL — AB (ref 32.0–36.0)
MCV: 87.5 fL (ref 80.0–100.0)
MONOS PCT: 4 %
Monocytes Absolute: 0.6 10*3/uL (ref 0.2–1.0)
NEUTROS ABS: 13.6 10*3/uL — AB (ref 1.4–6.5)
NEUTROS PCT: 95 %
Platelets: 126 10*3/uL — ABNORMAL LOW (ref 150–440)
RBC: 5.62 MIL/uL (ref 4.40–5.90)
RDW: 18 % — ABNORMAL HIGH (ref 11.5–14.5)
WBC: 14.5 10*3/uL — ABNORMAL HIGH (ref 3.8–10.6)

## 2015-08-26 MED ORDER — MOMETASONE FURO-FORMOTEROL FUM 100-5 MCG/ACT IN AERO: 2.0000 | INHALATION_SPRAY | Freq: Two times a day (BID) | RESPIRATORY_TRACT | Status: AC

## 2015-08-26 MED ORDER — POTASSIUM CHLORIDE CRYS ER 10 MEQ PO TBCR: 10.0000 meq | EXTENDED_RELEASE_TABLET | Freq: Every day | ORAL | Status: AC

## 2015-08-26 MED ORDER — IPRATROPIUM-ALBUTEROL 0.5-2.5 (3) MG/3ML IN SOLN: 3.0000 mL | Freq: Four times a day (QID) | RESPIRATORY_TRACT | Status: AC

## 2015-08-26 MED ORDER — PANTOPRAZOLE SODIUM 40 MG PO TBEC: 40.0000 mg | DELAYED_RELEASE_TABLET | Freq: Every day | ORAL | Status: AC

## 2015-08-26 MED ORDER — LEVOFLOXACIN 500 MG PO TABS: 500.0000 mg | ORAL_TABLET | Freq: Every day | ORAL | Status: AC

## 2015-08-26 MED ORDER — ZOLPIDEM TARTRATE 5 MG PO TABS: 5.0000 mg | ORAL_TABLET | Freq: Every evening | ORAL | Status: AC | PRN

## 2015-08-26 MED ORDER — ALBUTEROL SULFATE (2.5 MG/3ML) 0.083% IN NEBU: 2.5000 mg | INHALATION_SOLUTION | RESPIRATORY_TRACT | Status: AC | PRN

## 2015-08-26 MED ORDER — DABIGATRAN ETEXILATE MESYLATE 150 MG PO CAPS: 150.0000 mg | ORAL_CAPSULE | Freq: Two times a day (BID) | ORAL | Status: AC

## 2015-08-26 MED ORDER — ACETAMINOPHEN 325 MG PO TABS: 650.0000 mg | ORAL_TABLET | Freq: Four times a day (QID) | ORAL | Status: AC | PRN

## 2015-08-26 MED ORDER — PREDNISONE 20 MG PO TABS: ORAL_TABLET | ORAL | Status: AC

## 2015-08-26 MED ORDER — BUDESONIDE 0.25 MG/2ML IN SUSP
0.2500 mg | Freq: Two times a day (BID) | RESPIRATORY_TRACT | Status: AC
Start: 2015-08-26 — End: ?

## 2015-08-26 NOTE — Discharge Instructions (Addendum)
°  Follow up with Dr. Thedore MinsSingh, Dr. Meredeth IdeFleming and Dr. Darrold JunkerParaschos as out patient. Earliest available appointment.  [facility] appointment on September 02, 2015 at 1:00pm with Clarisa Kindredina Hackney, FNP. Please call [phone] to reschedule.

## 2015-08-26 NOTE — Progress Notes (Signed)
Discharge instructions given to patient it was noted that Dr Thedore MinsSingh prescribed meds that are at the patients pharmacy. Pt does not have money to pick up meds. Dr Thedore MinsSingh notified she stated that the meds prescribed and sitting at patients pharmacy are not nesaccessary that the prednisone taper was the most important med. Prednisone taper was filled and handed to patient. Pt is aware and understands plan of care.

## 2015-08-26 NOTE — Care Management (Signed)
[facility] [phone] and patient has home O2 through them. Patient states he has called his wife to pick him up but she cannot and will not be able to bring his O2 tank from home due to her disabilities. [facility]2 tank for transport home to this room. Patient will need to take his Prednisone Rx to [facility] for assistance. I have faxed Rx to [facility].

## 2015-08-26 NOTE — Discharge Summary (Signed)
Physician Discharge Summary  Patient ID: Jonathan PaceKelvin D Alberto Sr. MRN: [id number] DOB/AGE: 56/02/1961 56 y.o.  Admit date: 08/17/2015 Discharge date: 08/26/2015  Admission Diagnoses: Acute on chronic hypoxic respiratory failure,  Acute exacerbation of COPD, Acute on chronic diastolic congestive heart failure, Chronic atrial fibrillation, Coagulopathy secondary to Coumadin.  Discharge Diagnoses:    Chronic obstructive pulmonary disease, chronic hypoxia on supplemental oxygen   Chronic diastolic CHF (congestive heart failure) (HCC)   Chronic atrial fibrillation with controlled ventricular response rate   Pulmonary tuberculosis, advanced fibrosis   Obstructive sleep apnea  Discharged Condition: stable  Hospital Course: 54 year old male presented with acute onset shortness of breath and hypoxia. History of chronic atrial fibrillation and multiple pulmonary problems including pulmonary tuberculosis, advanced fibrosis, cavitations, chronic obstructive pulmonary disease, obstructive sleep apnea and hypoxia. He uses supplemental oxygen by nasal cannula at 5-8 L at baseline. Patient was started on BiPAP in the emergency room.  He received Lasix and IV Solu-Medrol. Patient was initially admitted to CCU.  He received IV Solu-Medrol, DuoNeb treatments, Dulera inhaler and IV Lasix. Heart rate was controlled with his usual dose of Cardizem and Atenolol.  Patient was noted to have elevated INR over 10.  Coumadin was held.  Coagulopathy was corrected with IV vitamin K and fresh frozen plasma. Patient was evaluated by Cardiology,  Hematology and Pulmonary. He was started on Pradaxa 150 mg twice daily for stroke prophylaxis.  Warfarin was discontinued. Patient's cardiopulmonary status improved with treatment. He was switched to supplemental oxygen by nasal cannula.  Patient was transferred to the medical floor. He was started on budesonide nebulizer treatments in addition to DuoNeb.  Patient received BiPAP overnight for  obstructive sleep apnea and GI prophylaxis with Protonix. Patient is cardiopulmonary status improved very slowly.  He feels much better overall today.  Shall discharge home today.  Patient will continue his usual medications as prescribed. Take prednisone 60 mg daily and gradually taper to 10 mg daily over 2 weeks. Patient has high-flow oxygen and CPAP at home. He will follow up in the office with me. Dr. Meredeth IdeFleming and Dr. Darrold JunkerParaschos.  Discharge Exam: Blood pressure 103/53, pulse 102, temperature 97.6 F (36.4 C), temperature source Oral, resp. rate 17, height 5\' 11"  (1.803 m), weight 146 kg (321 lb 14 oz), SpO2 93 %.   GENERAL:alert, oriented x3, no acute distress. Oxygen by nasal cannula in use.  HEENT: PERRL; no pallor, no icterus.  NECK: supple. No thyroid enlargement, no tenderness.  LUNGS: no rhonchi, minimal bibasilar crackles, good air entry bilaterally. No use of accessory muscles.  CARDIOVASCULAR: irregular rhythm, S1, S2, + systolic murmur.  ABDOMEN: Soft, non-tender, normal bowel sounds, no organomegaly or mass.  EXTREMITIES: decreased bilateral lower leg edema. No cyanosis or clubbing.  NEUROLOGIC: CN appear grossly intact; moves all extremities LYMPH: no cervical or supraclavicular lymphadenopathy  Disposition: 01-Home or Self Care  Discharge Instructions    AMB referral to CHF clinic    Complete by:  As directed             Medication List    STOP taking these medications        warfarin 1 MG tablet  Commonly known as:  COUMADIN     warfarin 10 MG tablet  Commonly known as:  COUMADIN      TAKE these medications        acetaminophen 325 MG tablet  Commonly known as:  TYLENOL  Take 2 tablets (650 mg total) by mouth every 6 (six)  hours as needed for mild pain (or Fever >/= 101).     albuterol (2.5 MG/3ML) 0.083% nebulizer solution  Commonly known as:  PROVENTIL  Take 3 mLs (2.5 mg total) by nebulization every 4 (four) hours as needed for wheezing or  shortness of breath.     allopurinol 300 MG tablet  Commonly known as:  ZYLOPRIM  Take 300 mg by mouth daily.     atenolol 50 MG tablet  Commonly known as:  TENORMIN  Take 50 mg by mouth daily.     budesonide 0.25 MG/2ML nebulizer solution  Commonly known as:  PULMICORT  Take 2 mLs (0.25 mg total) by nebulization 2 (two) times daily.     colchicine 0.6 MG tablet  Take 0.6 mg by mouth 2 (two) times daily as needed (for gout flares).     dabigatran 150 MG Caps capsule  Commonly known as:  PRADAXA  Take 1 capsule (150 mg total) by mouth every 12 (twelve) hours.     diltiazem 240 MG 24 hr capsule  Commonly known as:  CARDIZEM CD  Take 240 mg by mouth daily.     fluticasone 50 MCG/ACT nasal spray  Commonly known as:  FLONASE  Place 2 sprays into both nostrils daily.     ipratropium-albuterol 0.5-2.5 (3) MG/3ML Soln  Commonly known as:  DUONEB  Take 3 mLs by nebulization 4 (four) times daily as needed (shortness of breath/wheezing).     levofloxacin 500 MG tablet  Commonly known as:  LEVAQUIN  Take 1 tablet (500 mg total) by mouth daily.     lovastatin 20 MG tablet  Commonly known as:  MEVACOR  Take 20 mg by mouth at bedtime.     mometasone-formoterol 100-5 MCG/ACT Aero  Commonly known as:  DULERA  Inhale 2 puffs into the lungs 2 (two) times daily.     pantoprazole 40 MG tablet  Commonly known as:  PROTONIX  Take 1 tablet (40 mg total) by mouth daily.     potassium chloride 10 MEQ tablet  Commonly known as:  K-DUR,KLOR-CON  Take 1 tablet (10 mEq total) by mouth daily.     predniSONE 20 MG tablet  Commonly known as:  DELTASONE  Take 3 tablets daily for 4 days then 2 tablets daily for 4 days then one tablet daily for 4 days.     torsemide 20 MG tablet  Commonly known as:  DEMADEX  Take 40 mg by mouth daily.     vitamin B-6 25 MG tablet  Commonly known as:  pyridOXINE  Take 25 mg by mouth daily.     zolpidem 5 MG tablet  Commonly known as:  AMBIEN  Take 1  tablet (5 mg total) by mouth at bedtime as needed for sleep.           Follow-up Information    Follow up with Delma Freeze, FNP. Go on 09/02/2015.   Specialty:  Family Medicine   Why:  at 1:00pm , to the [facility]   Contact information:   [address] [place] [address] [phone]       Follow up with Ned Clines, MD On 09/09/2015.   Specialty:  Specialist   Why:  Appointment is at 9:00   Contact information:   [address] [facility] - PULMONOLOGY [place] [address] [phone]       Signed: Mathis Cashman 08/26/2015, 12:46 PM

## 2015-08-26 NOTE — Care Management (Signed)
This RNCM picked prednisone rx up from Medication Management and [facility]. Patient states he cannot get a ride home until tomorrow. I have been notified by the Dr. Doristine ChurchSingh's office that [facility] although Dr. Thedore MinsSingh stated that he would only need assistance with Prednisone and that was the only Rx she gave me. Notified by Dava RN that patient does not have any of his home medications although I am not certain that patient is speaking truthfully since he did have Medicaid coverage for the month of October. Notified by Dr. Thedore MinsSingh that patient only need prednisone assistance. He also said his wife uses a walker to ambulate and I've found that that must be his mother. Per [facility] his mother is listed as deceased but has same name as his wife on their demographics.  I am actually not certain that patient still lives with his wife although he says he does. Patient advised to call case worker on Monday and fix his Medicaid as that is not something that [facility] do for [facility]. He does not have medical necessity that would require him to stay in the hospital. If patient cannot pay for taxi the CSW is aware that he may need assistance back to his [place] home. No further RNCM needs. Case closed.

## 2015-09-02 ENCOUNTER — Ambulatory Visit: Payer: Medicaid Other | Admitting: Family

## 2015-10-03 ENCOUNTER — Ambulatory Visit: Payer: Medicaid Other

## 2015-10-20 ENCOUNTER — Emergency Department: Payer: Medicaid Other

## 2015-10-20 ENCOUNTER — Inpatient Hospital Stay
Admission: EM | Admit: 2015-10-20 | Discharge: 2015-10-23 | DRG: 291 | Disposition: E | Payer: Medicaid Other | Attending: Internal Medicine | Admitting: Internal Medicine

## 2015-10-20 DIAGNOSIS — I482 Chronic atrial fibrillation, unspecified: Secondary | ICD-10-CM

## 2015-10-20 DIAGNOSIS — M109 Gout, unspecified: Secondary | ICD-10-CM | POA: Diagnosis present

## 2015-10-20 DIAGNOSIS — J9601 Acute respiratory failure with hypoxia: Secondary | ICD-10-CM

## 2015-10-20 DIAGNOSIS — Z8611 Personal history of tuberculosis: Secondary | ICD-10-CM

## 2015-10-20 DIAGNOSIS — R0602 Shortness of breath: Secondary | ICD-10-CM

## 2015-10-20 DIAGNOSIS — G4733 Obstructive sleep apnea (adult) (pediatric): Secondary | ICD-10-CM | POA: Diagnosis present

## 2015-10-20 DIAGNOSIS — J8410 Pulmonary fibrosis, unspecified: Secondary | ICD-10-CM | POA: Diagnosis present

## 2015-10-20 DIAGNOSIS — Z825 Family history of asthma and other chronic lower respiratory diseases: Secondary | ICD-10-CM

## 2015-10-20 DIAGNOSIS — N2581 Secondary hyperparathyroidism of renal origin: Secondary | ICD-10-CM | POA: Diagnosis present

## 2015-10-20 DIAGNOSIS — I4901 Ventricular fibrillation: Secondary | ICD-10-CM | POA: Diagnosis present

## 2015-10-20 DIAGNOSIS — T502X5A Adverse effect of carbonic-anhydrase inhibitors, benzothiadiazides and other diuretics, initial encounter: Secondary | ICD-10-CM | POA: Diagnosis present

## 2015-10-20 DIAGNOSIS — E669 Obesity, unspecified: Secondary | ICD-10-CM | POA: Diagnosis present

## 2015-10-20 DIAGNOSIS — N179 Acute kidney failure, unspecified: Secondary | ICD-10-CM | POA: Diagnosis present

## 2015-10-20 DIAGNOSIS — J441 Chronic obstructive pulmonary disease with (acute) exacerbation: Secondary | ICD-10-CM | POA: Diagnosis present

## 2015-10-20 DIAGNOSIS — I5033 Acute on chronic diastolic (congestive) heart failure: Secondary | ICD-10-CM

## 2015-10-20 DIAGNOSIS — F419 Anxiety disorder, unspecified: Secondary | ICD-10-CM | POA: Diagnosis present

## 2015-10-20 DIAGNOSIS — N183 Chronic kidney disease, stage 3 unspecified: Secondary | ICD-10-CM

## 2015-10-20 DIAGNOSIS — K5900 Constipation, unspecified: Secondary | ICD-10-CM | POA: Diagnosis present

## 2015-10-20 DIAGNOSIS — I959 Hypotension, unspecified: Secondary | ICD-10-CM | POA: Diagnosis present

## 2015-10-20 DIAGNOSIS — R000 Tachycardia, unspecified: Secondary | ICD-10-CM | POA: Diagnosis present

## 2015-10-20 DIAGNOSIS — R0682 Tachypnea, not elsewhere classified: Secondary | ICD-10-CM | POA: Diagnosis present

## 2015-10-20 DIAGNOSIS — J45909 Unspecified asthma, uncomplicated: Secondary | ICD-10-CM | POA: Diagnosis present

## 2015-10-20 DIAGNOSIS — I5043 Acute on chronic combined systolic (congestive) and diastolic (congestive) heart failure: Principal | ICD-10-CM | POA: Diagnosis present

## 2015-10-20 DIAGNOSIS — J9621 Acute and chronic respiratory failure with hypoxia: Secondary | ICD-10-CM | POA: Diagnosis present

## 2015-10-20 DIAGNOSIS — I130 Hypertensive heart and chronic kidney disease with heart failure and stage 1 through stage 4 chronic kidney disease, or unspecified chronic kidney disease: Secondary | ICD-10-CM | POA: Diagnosis present

## 2015-10-20 DIAGNOSIS — Z9981 Dependence on supplemental oxygen: Secondary | ICD-10-CM

## 2015-10-20 DIAGNOSIS — Z809 Family history of malignant neoplasm, unspecified: Secondary | ICD-10-CM

## 2015-10-20 DIAGNOSIS — Z7901 Long term (current) use of anticoagulants: Secondary | ICD-10-CM

## 2015-10-20 DIAGNOSIS — E785 Hyperlipidemia, unspecified: Secondary | ICD-10-CM | POA: Diagnosis present

## 2015-10-20 DIAGNOSIS — Z6841 Body Mass Index (BMI) 40.0 and over, adult: Secondary | ICD-10-CM

## 2015-10-20 DIAGNOSIS — I509 Heart failure, unspecified: Secondary | ICD-10-CM

## 2015-10-20 DIAGNOSIS — I469 Cardiac arrest, cause unspecified: Secondary | ICD-10-CM | POA: Diagnosis present

## 2015-10-20 DIAGNOSIS — J9620 Acute and chronic respiratory failure, unspecified whether with hypoxia or hypercapnia: Secondary | ICD-10-CM | POA: Diagnosis present

## 2015-10-20 DIAGNOSIS — R0600 Dyspnea, unspecified: Secondary | ICD-10-CM

## 2015-10-20 DIAGNOSIS — Z79899 Other long term (current) drug therapy: Secondary | ICD-10-CM

## 2015-10-20 LAB — CBC WITH DIFFERENTIAL/PLATELET
Basophils Absolute: 0.1 10*3/uL (ref 0–0.1)
Basophils Relative: 1 %
Eosinophils Absolute: 0.2 10*3/uL (ref 0–0.7)
Eosinophils Relative: 1 %
HCT: 49 % (ref 40.0–52.0)
Hemoglobin: 15.4 g/dL (ref 13.0–18.0)
LYMPHS PCT: 6 %
Lymphs Abs: 0.8 10*3/uL — ABNORMAL LOW (ref 1.0–3.6)
MCH: 27.3 pg (ref 26.0–34.0)
MCHC: 31.4 g/dL — ABNORMAL LOW (ref 32.0–36.0)
MCV: 87.1 fL (ref 80.0–100.0)
MONO ABS: 1.4 10*3/uL — AB (ref 0.2–1.0)
Monocytes Relative: 10 %
NEUTROS ABS: 11 10*3/uL — AB (ref 1.4–6.5)
NEUTROS PCT: 82 %
PLATELETS: 165 10*3/uL (ref 150–440)
RBC: 5.63 MIL/uL (ref 4.40–5.90)
RDW: 20.7 % — AB (ref 11.5–14.5)
WBC: 13.5 10*3/uL — ABNORMAL HIGH (ref 3.8–10.6)

## 2015-10-20 MED ORDER — BUMETANIDE 0.25 MG/ML IJ SOLN
1.0000 mg | Freq: Once | INTRAMUSCULAR | Status: AC
  Administered 2015-10-21: 1 mg via INTRAVENOUS
  Filled 2015-10-20 (×2): qty 4

## 2015-10-20 MED ORDER — ASPIRIN 81 MG PO CHEW
324.0000 mg | CHEWABLE_TABLET | Freq: Once | ORAL | Status: AC
  Administered 2015-10-20: 324 mg via ORAL
  Filled 2015-10-20: qty 4

## 2015-10-20 MED ORDER — IPRATROPIUM-ALBUTEROL 0.5-2.5 (3) MG/3ML IN SOLN
3.0000 mL | Freq: Once | RESPIRATORY_TRACT | Status: AC
  Administered 2015-10-20: 3 mL via RESPIRATORY_TRACT
  Filled 2015-10-20: qty 3

## 2015-10-20 MED ORDER — METHYLPREDNISOLONE SODIUM SUCC 125 MG IJ SOLR
125.0000 mg | Freq: Once | INTRAMUSCULAR | Status: AC
  Administered 2015-10-20: 125 mg via INTRAVENOUS
  Filled 2015-10-20: qty 2

## 2015-10-20 NOTE — ED Notes (Signed)
Dr. Sung at bedside.  

## 2015-10-20 NOTE — ED Notes (Signed)
Pt presents to ED via EMS with difficulty breathing. EMS was unable to get an accurate O2 sat. PT has bilateral ankle edema, takes Toresemide at home but states "it's not's working". Pt takes breathing treatments at home q4h. Last treatment was at 6p.

## 2015-10-20 NOTE — ED Provider Notes (Signed)
[facility] Emergency Department Provider Note  ____________________________________________  Time seen: Approximately 11:36 PM  I have reviewed the triage vital signs and the nursing notes.   HISTORY  Chief Complaint Respiratory Distress    HPI Jonathan [name] Sr. is a 54 y.o. male who presents to the ED from home via EMS with a chief complaint of shortness of breath. Patient has a past medical history significant for COPD on 8 L home oxygen, CHF, atrial fibrillation on Pradaxa who complains of progressive shortness of breath times several days. Feels his torsemide is not working as he is not urinating as much at home and notes increased BLE swelling.Denies associated symptoms of chest pain, diaphoresis, nausea, palpitations. Denies recent fever, chills, cough, abdominal pain, vomiting, diarrhea. Denies recent travel or trauma. Nothing makes his symptoms better. Exertion makes his symptoms worse.   Past Medical History  Diagnosis Date  . Atrial fibrillation (HCC)   . Hypertension   . Tuberculosis   . Sleep apnea   . COPD (chronic obstructive pulmonary disease) (HCC)     on 5l home oxygen  . Obesity   . Gout   . Hyperlipidemia   . Acute exacerbation of COPD with asthma (HCC) 08/20/2015  . Acute on chronic diastolic CHF (congestive heart failure) (HCC) 08/20/2015    Patient Active Problem List   Diagnosis Date Noted  . Hypersomnia with sleep apnea 08/20/2015  . Acute exacerbation of COPD with asthma (HCC) 08/20/2015  . Acute on chronic diastolic CHF (congestive heart failure) (HCC) 08/20/2015  . Acute respiratory failure (HCC) 08/17/2015  . Dyspnea 02/28/2015  . A-fib (HCC) 02/28/2015    Past Surgical History  Procedure Laterality Date  . Bronchoscopy      Current Outpatient Rx  Name  Route  Sig  Dispense  Refill  . acetaminophen (TYLENOL) 325 MG tablet   Oral   Take 2 tablets (650 mg total) by mouth every 6 (six) hours as needed for mild  pain (or Fever >/= 101).   30 tablet   1   . albuterol (PROVENTIL) (2.5 MG/3ML) 0.083% nebulizer solution   Nebulization   Take 3 mLs (2.5 mg total) by nebulization every 4 (four) hours as needed for wheezing or shortness of breath.   75 mL   12   . allopurinol (ZYLOPRIM) 300 MG tablet   Oral   Take 300 mg by mouth daily.         Marland Kitchen atenolol (TENORMIN) 50 MG tablet   Oral   Take 50 mg by mouth daily.         . budesonide (PULMICORT) 0.25 MG/2ML nebulizer solution   Nebulization   Take 2 mLs (0.25 mg total) by nebulization 2 (two) times daily.   60 mL   12   . colchicine 0.6 MG tablet   Oral   Take 0.6 mg by mouth 2 (two) times daily as needed (for gout flares).          . dabigatran (PRADAXA) 150 MG CAPS capsule   Oral   Take 1 capsule (150 mg total) by mouth every 12 (twelve) hours.   60 capsule   2   . diltiazem (CARDIZEM CD) 240 MG 24 hr capsule   Oral   Take 240 mg by mouth daily.         . fluticasone (FLONASE) 50 MCG/ACT nasal spray   Each Nare   Place 2 sprays into both nostrils daily.          Marland Kitchen  ipratropium-albuterol (DUONEB) 0.5-2.5 (3) MG/3ML SOLN   Nebulization   Take 3 mLs by nebulization 4 (four) times daily as needed (shortness of breath/wheezing).         Marland Kitchen ipratropium-albuterol (DUONEB) 0.5-2.5 (3) MG/3ML SOLN   Nebulization   Take 3 mLs by nebulization every 6 (six) hours.   360 mL   11   . levofloxacin (LEVAQUIN) 500 MG tablet   Oral   Take 1 tablet (500 mg total) by mouth daily.   7 tablet   0   . lovastatin (MEVACOR) 20 MG tablet   Oral   Take 20 mg by mouth at bedtime.          . mometasone-formoterol (DULERA) 100-5 MCG/ACT AERO   Inhalation   Inhale 2 puffs into the lungs 2 (two) times daily.   1 Inhaler   3   . pantoprazole (PROTONIX) 40 MG tablet   Oral   Take 1 tablet (40 mg total) by mouth daily.   30 tablet   3   . potassium chloride (K-DUR,KLOR-CON) 10 MEQ tablet   Oral   Take 1 tablet (10 mEq total)  by mouth daily.   30 tablet   1   . predniSONE (DELTASONE) 20 MG tablet      Take 3 tablets daily for 4 days then 2 tablets daily for 4 days then one tablet daily for 4 days.   24 tablet   0   . torsemide (DEMADEX) 20 MG tablet   Oral   Take 40 mg by mouth daily.         . vitamin B-6 (PYRIDOXINE) 25 MG tablet   Oral   Take 25 mg by mouth daily.         Marland Kitchen zolpidem (AMBIEN) 5 MG tablet   Oral   Take 1 tablet (5 mg total) by mouth at bedtime as needed for sleep.   30 tablet   0     Allergies Review of patient's allergies indicates no known allergies.  Family History  Problem Relation Age of Onset  . Cancer Mother   . Emphysema Father     Social History Social History  Substance Use Topics  . Smoking status: Never Smoker   . Smokeless tobacco: None  . Alcohol Use: Yes    Review of Systems Constitutional: No fever/chills Eyes: No visual changes. ENT: No sore throat. Cardiovascular: Denies chest pain. Respiratory: Positive for shortness of breath. Gastrointestinal: No abdominal pain.  No nausea, no vomiting.  No diarrhea.  No constipation. Genitourinary: Negative for dysuria. Musculoskeletal: Positive for BLE swelling. Negative for back pain. Skin: Negative for rash. Neurological: Negative for headaches, focal weakness or numbness.  10-point ROS otherwise negative.  ____________________________________________   PHYSICAL EXAM:  VITAL SIGNS: ED Triage Vitals  Enc Vitals Group     BP --      Pulse Rate 10/11/2015 2332 107     Resp 10/12/2015 2332 37     Temp --      Temp src --      SpO2 10/03/2015 2332 82 %     Weight --      Height --      Head Cir --      Peak Flow --      Pain Score 09/23/2015 2332 0     Pain Loc --      Pain Edu? --      Excl. in GC? --     Constitutional: Alert and oriented.  Well appearing and in moderate acute distress. Eyes: Conjunctivae are normal. PERRL. EOMI. Head: Atraumatic. Nose: No  congestion/rhinnorhea. Mouth/Throat: Mucous membranes are moist.  Oropharynx non-erythematous. Neck: No stridor.  No carotid bruits. Cardiovascular: Tachycardic rate, irregular rhythm. Grossly normal heart sounds.  Good peripheral circulation. Respiratory: Increased respiratory effort.  No retractions. Lungs diminished bibasilarly, rales and wheezing heard. Gastrointestinal: Soft and nontender. No distention. No abdominal bruits. No CVA tenderness. Musculoskeletal: 2+ BLE pitting edema.  No joint effusions. Neurologic:  Normal speech and language. No gross focal neurologic deficits are appreciated.  Skin:  Skin is warm, dry and intact. No rash noted. Psychiatric: Mood and affect are normal. Speech and behavior are normal.  ____________________________________________   LABS (all labs ordered are listed, but only abnormal results are displayed)  Labs Reviewed  CBC WITH DIFFERENTIAL/PLATELET - Abnormal; Notable for the following:    WBC 13.5 (*)    MCHC 31.4 (*)    RDW 20.7 (*)    Neutro Abs 11.0 (*)    Lymphs Abs 0.8 (*)    Monocytes Absolute 1.4 (*)    All other components within normal limits  COMPREHENSIVE METABOLIC PANEL - Abnormal; Notable for the following:    Chloride 96 (*)    Glucose, Bld 109 (*)    BUN 50 (*)    Creatinine, Ser 1.73 (*)    Total Bilirubin 1.8 (*)    GFR calc non Af Amer 42 (*)    GFR calc Af Amer 49 (*)    All other components within normal limits  BRAIN NATRIURETIC PEPTIDE - Abnormal; Notable for the following:    B Natriuretic Peptide 988.0 (*)    All other components within normal limits  TROPONIN I - Abnormal; Notable for the following:    Troponin I 0.04 (*)    All other components within normal limits   ____________________________________________  EKG  ED ECG REPORT I, SUNG,JADE J, the attending physician, personally viewed and interpreted this ECG.   Date: 10/21/2015  EKG Time: 2342  Rate: 106  Rhythm: atrial fibrillation, rate  106  Axis: Normal  Intervals:none  ST&T Change: Nonspecific  ____________________________________________  RADIOLOGY  Portable chest x-ray (viewed by me, interpreted per Dr. Cherly Hensenhang): Lungs hypoexpanded. Vascular congestion and cardiomegaly. Mildly increased interstitial markings raise concern for mild interstitial edema. ____________________________________________   PROCEDURES  Procedure(s) performed: None  Critical Care performed: Yes, see critical care note(s)   CRITICAL CARE Performed by: Irean HongSUNG,JADE J   Total critical care time: 30 minutes  Critical care time was exclusive of separately billable procedures and treating other patients.  Critical care was necessary to treat or prevent imminent or life-threatening deterioration.  Critical care was time spent personally by me on the following activities: development of treatment plan with patient and/or surrogate as well as nursing, discussions with consultants, evaluation of patient's response to treatment, examination of patient, obtaining history from patient or surrogate, ordering and performing treatments and interventions, ordering and review of laboratory studies, ordering and review of radiographic studies, pulse oximetry and re-evaluation of patient's condition.  ____________________________________________   INITIAL IMPRESSION / ASSESSMENT AND PLAN / ED COURSE  Pertinent labs & imaging results that were available during my care of the patient were reviewed by me and considered in my medical decision making (see chart for details).  54 year old male with a history of COPD on 8 L home oxygen, CHF, atrial fibrillation on Pradaxa who presents with progressive shortness of breath times several days, worse tonight. Suspect mainly  CHF exacerbation, although COPD exacerbation may be superimposed. Will initiate nebulizer treatments, IV Solu-Medrol, IV Bumex and reassess. At this time patient is comfortable on 5 L O2 via nasal  cannula; will hold BiPAP unless patient decompensates.  ----------------------------------------- 12:52 AM on 10/21/2015 -----------------------------------------  Updated patient of laboratory and imaging results. He remains mildly tachypneic although states he is comfortable staying on his nasal cannula rather than be placed on BiPAP. Discussed with hospitalist to evaluate in the emergency department for admission. ____________________________________________   FINAL CLINICAL IMPRESSION(S) / ED DIAGNOSES  Final diagnoses:  Shortness of breath  Acute on chronic diastolic CHF (congestive heart failure) (HCC)  Acute respiratory failure with hypoxia (HCC)  Dyspnea  Chronic atrial fibrillation (HCC)      Irean Hong, MD 10/21/15 [phone]

## 2015-10-21 ENCOUNTER — Inpatient Hospital Stay: Payer: Medicaid Other

## 2015-10-21 DIAGNOSIS — I5020 Unspecified systolic (congestive) heart failure: Secondary | ICD-10-CM | POA: Diagnosis not present

## 2015-10-21 DIAGNOSIS — Z7901 Long term (current) use of anticoagulants: Secondary | ICD-10-CM | POA: Diagnosis not present

## 2015-10-21 DIAGNOSIS — I959 Hypotension, unspecified: Secondary | ICD-10-CM | POA: Diagnosis present

## 2015-10-21 DIAGNOSIS — R0682 Tachypnea, not elsewhere classified: Secondary | ICD-10-CM | POA: Diagnosis present

## 2015-10-21 DIAGNOSIS — J441 Chronic obstructive pulmonary disease with (acute) exacerbation: Secondary | ICD-10-CM | POA: Diagnosis present

## 2015-10-21 DIAGNOSIS — I509 Heart failure, unspecified: Secondary | ICD-10-CM

## 2015-10-21 DIAGNOSIS — E669 Obesity, unspecified: Secondary | ICD-10-CM | POA: Diagnosis present

## 2015-10-21 DIAGNOSIS — I130 Hypertensive heart and chronic kidney disease with heart failure and stage 1 through stage 4 chronic kidney disease, or unspecified chronic kidney disease: Secondary | ICD-10-CM | POA: Diagnosis present

## 2015-10-21 DIAGNOSIS — K5900 Constipation, unspecified: Secondary | ICD-10-CM | POA: Diagnosis present

## 2015-10-21 DIAGNOSIS — Z9981 Dependence on supplemental oxygen: Secondary | ICD-10-CM | POA: Diagnosis not present

## 2015-10-21 DIAGNOSIS — F419 Anxiety disorder, unspecified: Secondary | ICD-10-CM | POA: Diagnosis present

## 2015-10-21 DIAGNOSIS — N183 Chronic kidney disease, stage 3 (moderate): Secondary | ICD-10-CM | POA: Diagnosis present

## 2015-10-21 DIAGNOSIS — Z79899 Other long term (current) drug therapy: Secondary | ICD-10-CM | POA: Diagnosis not present

## 2015-10-21 DIAGNOSIS — J9620 Acute and chronic respiratory failure, unspecified whether with hypoxia or hypercapnia: Secondary | ICD-10-CM | POA: Diagnosis not present

## 2015-10-21 DIAGNOSIS — T502X5A Adverse effect of carbonic-anhydrase inhibitors, benzothiadiazides and other diuretics, initial encounter: Secondary | ICD-10-CM | POA: Diagnosis present

## 2015-10-21 DIAGNOSIS — Z809 Family history of malignant neoplasm, unspecified: Secondary | ICD-10-CM | POA: Diagnosis not present

## 2015-10-21 DIAGNOSIS — N179 Acute kidney failure, unspecified: Secondary | ICD-10-CM | POA: Diagnosis present

## 2015-10-21 DIAGNOSIS — R000 Tachycardia, unspecified: Secondary | ICD-10-CM | POA: Diagnosis present

## 2015-10-21 DIAGNOSIS — I469 Cardiac arrest, cause unspecified: Secondary | ICD-10-CM | POA: Diagnosis present

## 2015-10-21 DIAGNOSIS — I4901 Ventricular fibrillation: Secondary | ICD-10-CM | POA: Diagnosis present

## 2015-10-21 DIAGNOSIS — Z6841 Body Mass Index (BMI) 40.0 and over, adult: Secondary | ICD-10-CM | POA: Diagnosis not present

## 2015-10-21 DIAGNOSIS — G4733 Obstructive sleep apnea (adult) (pediatric): Secondary | ICD-10-CM | POA: Diagnosis present

## 2015-10-21 DIAGNOSIS — Z825 Family history of asthma and other chronic lower respiratory diseases: Secondary | ICD-10-CM | POA: Diagnosis not present

## 2015-10-21 DIAGNOSIS — I482 Chronic atrial fibrillation: Secondary | ICD-10-CM | POA: Diagnosis present

## 2015-10-21 DIAGNOSIS — N2581 Secondary hyperparathyroidism of renal origin: Secondary | ICD-10-CM | POA: Diagnosis present

## 2015-10-21 DIAGNOSIS — J45909 Unspecified asthma, uncomplicated: Secondary | ICD-10-CM | POA: Diagnosis present

## 2015-10-21 DIAGNOSIS — J9621 Acute and chronic respiratory failure with hypoxia: Secondary | ICD-10-CM | POA: Diagnosis present

## 2015-10-21 DIAGNOSIS — M109 Gout, unspecified: Secondary | ICD-10-CM | POA: Diagnosis present

## 2015-10-21 DIAGNOSIS — I5043 Acute on chronic combined systolic (congestive) and diastolic (congestive) heart failure: Secondary | ICD-10-CM | POA: Diagnosis present

## 2015-10-21 DIAGNOSIS — J8410 Pulmonary fibrosis, unspecified: Secondary | ICD-10-CM | POA: Diagnosis present

## 2015-10-21 DIAGNOSIS — E785 Hyperlipidemia, unspecified: Secondary | ICD-10-CM | POA: Diagnosis present

## 2015-10-21 DIAGNOSIS — R0602 Shortness of breath: Secondary | ICD-10-CM | POA: Diagnosis present

## 2015-10-21 DIAGNOSIS — Z8611 Personal history of tuberculosis: Secondary | ICD-10-CM | POA: Diagnosis not present

## 2015-10-21 LAB — PHOSPHORUS: PHOSPHORUS: 5.7 mg/dL — AB (ref 2.5–4.6)

## 2015-10-21 LAB — COMPREHENSIVE METABOLIC PANEL
ALT: 22 U/L (ref 17–63)
ANION GAP: 12 (ref 5–15)
AST: 30 U/L (ref 15–41)
Albumin: 3.9 g/dL (ref 3.5–5.0)
Alkaline Phosphatase: 68 U/L (ref 38–126)
BUN: 50 mg/dL — ABNORMAL HIGH (ref 6–20)
CHLORIDE: 96 mmol/L — AB (ref 101–111)
CO2: 29 mmol/L (ref 22–32)
Calcium: 9.3 mg/dL (ref 8.9–10.3)
Creatinine, Ser: 1.73 mg/dL — ABNORMAL HIGH (ref 0.61–1.24)
GFR calc non Af Amer: 42 mL/min — ABNORMAL LOW (ref >60)
GFR, EST AFRICAN AMERICAN: 49 mL/min — AB (ref >60)
Glucose, Bld: 109 mg/dL — ABNORMAL HIGH (ref 65–99)
Potassium: 3.8 mmol/L (ref 3.5–5.1)
SODIUM: 137 mmol/L (ref 135–145)
Total Bilirubin: 1.8 mg/dL — ABNORMAL HIGH (ref 0.3–1.2)
Total Protein: 6.9 g/dL (ref 6.5–8.1)

## 2015-10-21 LAB — BASIC METABOLIC PANEL
Anion gap: 12 (ref 5–15)
BUN: 52 mg/dL — ABNORMAL HIGH (ref 6–20)
CALCIUM: 9.3 mg/dL (ref 8.9–10.3)
CO2: 30 mmol/L (ref 22–32)
CREATININE: 1.67 mg/dL — AB (ref 0.61–1.24)
Chloride: 95 mmol/L — ABNORMAL LOW (ref 101–111)
GFR calc Af Amer: 51 mL/min — ABNORMAL LOW (ref >60)
GFR calc non Af Amer: 44 mL/min — ABNORMAL LOW (ref >60)
GLUCOSE: 112 mg/dL — AB (ref 65–99)
Potassium: 4.3 mmol/L (ref 3.5–5.1)
Sodium: 137 mmol/L (ref 135–145)

## 2015-10-21 LAB — CBC
HCT: 50.5 % (ref 40.0–52.0)
Hemoglobin: 16.2 g/dL (ref 13.0–18.0)
MCH: 28.2 pg (ref 26.0–34.0)
MCHC: 32.1 g/dL (ref 32.0–36.0)
MCV: 87.9 fL (ref 80.0–100.0)
PLATELETS: 166 10*3/uL (ref 150–440)
RBC: 5.74 MIL/uL (ref 4.40–5.90)
RDW: 20.4 % — AB (ref 11.5–14.5)
WBC: 12.3 10*3/uL — AB (ref 3.8–10.6)

## 2015-10-21 LAB — BRAIN NATRIURETIC PEPTIDE: B NATRIURETIC PEPTIDE 5: 988 pg/mL — AB (ref 0.0–100.0)

## 2015-10-21 LAB — MRSA PCR SCREENING: MRSA by PCR: NEGATIVE

## 2015-10-21 LAB — TROPONIN I: TROPONIN I: 0.04 ng/mL — AB (ref <0.031)

## 2015-10-21 MED ORDER — IPRATROPIUM-ALBUTEROL 0.5-2.5 (3) MG/3ML IN SOLN
3.0000 mL | RESPIRATORY_TRACT | Status: DC
  Administered 2015-10-21 – 2015-10-22 (×8): 3 mL via RESPIRATORY_TRACT
  Filled 2015-10-21 (×8): qty 3

## 2015-10-21 MED ORDER — BUDESONIDE 0.25 MG/2ML IN SUSP: 0.2500 mg | Freq: Two times a day (BID) | RESPIRATORY_TRACT | Status: DC

## 2015-10-21 MED ORDER — SODIUM CHLORIDE 0.9 % IJ SOLN
3.0000 mL | Freq: Two times a day (BID) | INTRAMUSCULAR | Status: DC
  Administered 2015-10-21 – 2015-10-22 (×3): 3 mL via INTRAVENOUS

## 2015-10-21 MED ORDER — PYRIDOXINE HCL 25 MG PO TABS
25.0000 mg | ORAL_TABLET | Freq: Every day | ORAL | Status: DC
  Administered 2015-10-21: 25 mg via ORAL
  Administered 2015-10-22: 50 mg via ORAL
  Filled 2015-10-21 (×2): qty 1

## 2015-10-21 MED ORDER — ENOXAPARIN SODIUM 30 MG/0.3ML ~~LOC~~ SOLN: 30.0000 mg | SUBCUTANEOUS | Status: DC

## 2015-10-21 MED ORDER — ONDANSETRON HCL 4 MG/2ML IJ SOLN: 4.0000 mg | Freq: Four times a day (QID) | INTRAMUSCULAR | Status: DC | PRN

## 2015-10-21 MED ORDER — BUDESONIDE 0.25 MG/2ML IN SUSP
0.2500 mg | Freq: Two times a day (BID) | RESPIRATORY_TRACT | Status: DC
  Filled 2015-10-21: qty 2

## 2015-10-21 MED ORDER — SODIUM CHLORIDE 0.9 % IJ SOLN: 3.0000 mL | INTRAMUSCULAR | Status: DC | PRN

## 2015-10-21 MED ORDER — ONDANSETRON HCL 4 MG PO TABS
4.0000 mg | ORAL_TABLET | Freq: Four times a day (QID) | ORAL | Status: DC | PRN
Start: 2015-10-21 — End: 2015-10-22

## 2015-10-21 MED ORDER — METHYLPREDNISOLONE SODIUM SUCC 125 MG IJ SOLR
60.0000 mg | Freq: Two times a day (BID) | INTRAMUSCULAR | Status: DC
  Administered 2015-10-21 – 2015-10-22 (×3): 60 mg via INTRAVENOUS
  Filled 2015-10-21 (×3): qty 2

## 2015-10-21 MED ORDER — ACETAMINOPHEN 325 MG PO TABS: 650.0000 mg | ORAL_TABLET | Freq: Four times a day (QID) | ORAL | Status: DC | PRN

## 2015-10-21 MED ORDER — SODIUM CHLORIDE 0.9 % IV SOLN: 250.0000 mL | INTRAVENOUS | Status: DC | PRN

## 2015-10-21 MED ORDER — SODIUM CHLORIDE 0.9 % IJ SOLN
3.0000 mL | Freq: Two times a day (BID) | INTRAMUSCULAR | Status: DC
Start: 2015-10-21 — End: 2015-10-22
  Administered 2015-10-21 – 2015-10-22 (×2): 3 mL via INTRAVENOUS

## 2015-10-21 MED ORDER — BUDESONIDE 0.25 MG/2ML IN SUSP
0.5000 mg | Freq: Two times a day (BID) | RESPIRATORY_TRACT | Status: DC
  Administered 2015-10-21: 0.25 mg via RESPIRATORY_TRACT
  Administered 2015-10-22: 0.5 mg via RESPIRATORY_TRACT
  Filled 2015-10-21 (×2): qty 4

## 2015-10-21 MED ORDER — DABIGATRAN ETEXILATE MESYLATE 150 MG PO CAPS
150.0000 mg | ORAL_CAPSULE | Freq: Two times a day (BID) | ORAL | Status: DC
  Administered 2015-10-21 – 2015-10-22 (×3): 150 mg via ORAL
  Filled 2015-10-21 (×4): qty 1

## 2015-10-21 MED ORDER — FLUTICASONE PROPIONATE 50 MCG/ACT NA SUSP
2.0000 | Freq: Every day | NASAL | Status: DC
  Administered 2015-10-21 – 2015-10-22 (×2): 2 via NASAL
  Filled 2015-10-21: qty 16

## 2015-10-21 MED ORDER — ASPIRIN EC 81 MG PO TBEC
81.0000 mg | DELAYED_RELEASE_TABLET | Freq: Every day | ORAL | Status: DC
  Administered 2015-10-21 – 2015-10-22 (×2): 81 mg via ORAL
  Filled 2015-10-21 (×2): qty 1

## 2015-10-21 MED ORDER — DOCUSATE SODIUM 100 MG PO CAPS
100.0000 mg | ORAL_CAPSULE | Freq: Every day | ORAL | Status: DC
  Administered 2015-10-21 – 2015-10-22 (×2): 100 mg via ORAL
  Filled 2015-10-21 (×2): qty 1

## 2015-10-21 MED ORDER — PANTOPRAZOLE SODIUM 40 MG PO TBEC
40.0000 mg | DELAYED_RELEASE_TABLET | Freq: Every day | ORAL | Status: DC
  Administered 2015-10-21 – 2015-10-22 (×2): 40 mg via ORAL
  Filled 2015-10-21 (×2): qty 1

## 2015-10-21 MED ORDER — MOMETASONE FURO-FORMOTEROL FUM 100-5 MCG/ACT IN AERO
2.0000 | INHALATION_SPRAY | Freq: Two times a day (BID) | RESPIRATORY_TRACT | Status: DC
  Administered 2015-10-21 – 2015-10-22 (×5): 2 via RESPIRATORY_TRACT
  Filled 2015-10-21: qty 8.8

## 2015-10-21 MED ORDER — CETYLPYRIDINIUM CHLORIDE 0.05 % MT LIQD
7.0000 mL | Freq: Two times a day (BID) | OROMUCOSAL | Status: DC
  Administered 2015-10-21 – 2015-10-22 (×4): 7 mL via OROMUCOSAL

## 2015-10-21 MED ORDER — POTASSIUM CHLORIDE CRYS ER 10 MEQ PO TBCR
10.0000 meq | EXTENDED_RELEASE_TABLET | Freq: Every day | ORAL | Status: DC
  Administered 2015-10-21 – 2015-10-22 (×2): 10 meq via ORAL
  Filled 2015-10-21 (×2): qty 1

## 2015-10-21 MED ORDER — ALLOPURINOL 300 MG PO TABS
300.0000 mg | ORAL_TABLET | Freq: Every day | ORAL | Status: DC
  Administered 2015-10-21 – 2015-10-22 (×2): 300 mg via ORAL
  Filled 2015-10-21 (×2): qty 1

## 2015-10-21 MED ORDER — FUROSEMIDE 10 MG/ML IJ SOLN
80.0000 mg | Freq: Two times a day (BID) | INTRAMUSCULAR | Status: DC
  Administered 2015-10-21 – 2015-10-22 (×2): 80 mg via INTRAVENOUS
  Filled 2015-10-21 (×2): qty 8

## 2015-10-21 MED ORDER — ATENOLOL 50 MG PO TABS
50.0000 mg | ORAL_TABLET | Freq: Every day | ORAL | Status: DC
  Administered 2015-10-21 – 2015-10-22 (×2): 50 mg via ORAL
  Filled 2015-10-21 (×2): qty 1

## 2015-10-21 MED ORDER — BISACODYL 5 MG PO TBEC
5.0000 mg | DELAYED_RELEASE_TABLET | Freq: Every day | ORAL | Status: DC | PRN
  Administered 2015-10-21: 5 mg via ORAL
  Filled 2015-10-21 (×2): qty 1

## 2015-10-21 MED ORDER — ACETAMINOPHEN 650 MG RE SUPP: 650.0000 mg | Freq: Four times a day (QID) | RECTAL | Status: DC | PRN

## 2015-10-21 MED ORDER — FUROSEMIDE 10 MG/ML IJ SOLN
40.0000 mg | Freq: Three times a day (TID) | INTRAMUSCULAR | Status: DC
  Administered 2015-10-21: 40 mg via INTRAVENOUS
  Filled 2015-10-21: qty 4

## 2015-10-21 MED ORDER — ZOLPIDEM TARTRATE 5 MG PO TABS
10.0000 mg | ORAL_TABLET | Freq: Every evening | ORAL | Status: DC | PRN
  Administered 2015-10-21: 10 mg via ORAL
  Filled 2015-10-21: qty 2

## 2015-10-21 NOTE — ED Notes (Signed)
Called pharmacy about Bumetanide injection. Matt stated he would send it in a few minutes.

## 2015-10-21 NOTE — Progress Notes (Signed)
Initial appointment made at the [facility] Failure [facility] on November 10, 2015 at 11:00am. Thank you.

## 2015-10-21 NOTE — Consult Note (Signed)
[facility]  CONSULT NOTE    Date: 10/21/2015                  Patient Name:  Jonathan BUNCH Sr.  MRN: [id number]  DOB: 10/29/1960  Age / Sex: 54 y.o., male         PCP: Singh,Jasmine, MD                 Service Requesting Consult: Dr. Luberta Mutter                 Reason for Consult: Chronic Kidney Disease stage III            History of Present Illness: Mr. Jonathan CANTERA Sr. is a 54 y.o. black  male with atrial fibrillation, hypertension, history of TB, COPD, OSA on CPAP, gout, hyperlipidemia, and CHF, who was admitted to [facility] on 10/18/2015 for Shortness of breath [R06.02] Dyspnea [R06.00] Chronic atrial fibrillation (HCC) [I48.2] Acute respiratory failure with hypoxia (HCC) [J96.01] Acute on chronic diastolic CHF (congestive heart failure) (HCC) [I50.33]   Patient states that he began to have shortness of breath and was brought to [facility]. Nephrology consulted for a creatinine of 1.67 on admission. Creatinine baseline of 1.5 in 10/16. He has been started on IV furosemide 80mg  q12. He is now off BiPap and breathing [place] 5 litres. Baseline of 8 litres.   He states his main complaint currently is constipation.    Medications: Outpatient medications: Prescriptions prior to admission  Medication Sig Dispense Refill Last Dose  . acetaminophen (TYLENOL) 325 MG tablet Take 2 tablets (650 mg total) by mouth every 6 (six) hours as needed for mild pain (or Fever >/= 101). 30 tablet 1 PRN at PRN  . albuterol (PROVENTIL) (2.5 MG/3ML) 0.083% nebulizer solution Take 3 mLs (2.5 mg total) by nebulization every 4 (four) hours as needed for wheezing or shortness of breath. 75 mL 12 09/28/2015 at Unknown time  . allopurinol (ZYLOPRIM) 300 MG tablet Take 300 mg by mouth daily.   10/21/2015 at Unknown time  . atenolol (TENORMIN) 50 MG tablet Take 50 mg by mouth daily.   10/01/2015 at Unknown time  . budesonide (PULMICORT) 0.25 MG/2ML nebulizer solution Take 2 mLs (0.25 mg total)  by nebulization 2 (two) times daily. 60 mL 12 09/22/2015 at Unknown time  . colchicine 0.6 MG tablet Take 0.6 mg by mouth 2 (two) times daily as needed (for gout flares).    PRN at PRN  . dabigatran (PRADAXA) 150 MG CAPS capsule Take 1 capsule (150 mg total) by mouth every 12 (twelve) hours. 60 capsule 2 10/15/2015 at Unknown time  . diltiazem (CARDIZEM CD) 240 MG 24 hr capsule Take 240 mg by mouth daily.   10/15/2015 at Unknown time  . fluticasone (FLONASE) 50 MCG/ACT nasal spray Place 2 sprays into both nostrils daily.    Past Month at Unknown time  . ipratropium-albuterol (DUONEB) 0.5-2.5 (3) MG/3ML SOLN Take 3 mLs by nebulization every 6 (six) hours. 360 mL 11 10/14/2015 at Unknown time  . mometasone-formoterol (DULERA) 100-5 MCG/ACT AERO Inhale 2 puffs into the lungs 2 (two) times daily. 1 Inhaler 3 Past Month at Unknown time  . potassium chloride (K-DUR,KLOR-CON) 10 MEQ tablet Take 1 tablet (10 mEq total) by mouth daily. 30 tablet 1 10/18/2015 at Unknown time  . predniSONE (DELTASONE) 10 MG tablet Take 10 mg by mouth daily.   10/07/2015 at Unknown time  . torsemide (DEMADEX) 20 MG tablet Take 40  mg by mouth daily.   10/09/2015 at Unknown time  . vitamin B-6 (PYRIDOXINE) 25 MG tablet Take 25 mg by mouth daily.   09/23/2015 at Unknown time  . zolpidem (AMBIEN) 5 MG tablet Take 1 tablet (5 mg total) by mouth at bedtime as needed for sleep. (Patient taking differently: Take 10 mg by mouth at bedtime as needed for sleep. ) 30 tablet 0 09/22/2015 at Unknown time  . levofloxacin (LEVAQUIN) 500 MG tablet Take 1 tablet (500 mg total) by mouth daily. (Patient not taking: Reported on 10/21/2015) 7 tablet 0 Completed Course at Unknown time  . pantoprazole (PROTONIX) 40 MG tablet Take 1 tablet (40 mg total) by mouth daily. (Patient not taking: Reported on 10/21/2015) 30 tablet 3 Not Taking at Unknown time  . predniSONE (DELTASONE) 20 MG tablet Take 3 tablets daily for 4 days then 2 tablets daily for 4 days  then one tablet daily for 4 days. (Patient not taking: Reported on 10/21/2015) 24 tablet 0 Completed Course at Unknown time    Current medications: Current Facility-Administered Medications  Medication Dose Route Frequency Provider Last Rate Last Dose  . 0.9 %  sodium chloride infusion  250 mL Intravenous PRN Milagros Loll, MD      . acetaminophen (TYLENOL) tablet 650 mg  650 mg Oral Q6H PRN Milagros Loll, MD       Or  . acetaminophen (TYLENOL) suppository 650 mg  650 mg Rectal Q6H PRN Srikar Sudini, MD      . allopurinol (ZYLOPRIM) tablet 300 mg  300 mg Oral Daily Milagros Loll, MD   300 mg at 10/21/15 0909  . antiseptic oral rinse (CPC / CETYLPYRIDINIUM CHLORIDE 0.05%) solution 7 mL  7 mL Mouth Rinse BID Milagros Loll, MD   7 mL at 10/21/15 0917  . aspirin EC tablet 81 mg  81 mg Oral Daily Milagros Loll, MD   81 mg at 10/21/15 0909  . atenolol (TENORMIN) tablet 50 mg  50 mg Oral Daily Srikar Sudini, MD   50 mg at 10/21/15 1019  . bisacodyl (DULCOLAX) EC tablet 5 mg  5 mg Oral Daily PRN Katha Hamming, MD      . budesonide (PULMICORT) nebulizer solution 0.5 mg  0.5 mg Nebulization BID Erin Fulling, MD      . dabigatran (PRADAXA) capsule 150 mg  150 mg Oral Q12H Milagros Loll, MD   150 mg at 10/21/15 0909  . docusate sodium (COLACE) capsule 100 mg  100 mg Oral Daily Katha Hamming, MD   100 mg at 10/21/15 1444  . fluticasone (FLONASE) 50 MCG/ACT nasal spray 2 spray  2 spray Each Nare Daily Milagros Loll, MD   2 spray at 10/21/15 0910  . furosemide (LASIX) injection 80 mg  80 mg Intravenous Q12H Katha Hamming, MD      . ipratropium-albuterol (DUONEB) 0.5-2.5 (3) MG/3ML nebulizer solution 3 mL  3 mL Nebulization Q4H Srikar Sudini, MD   3 mL at 10/21/15 1151  . methylPREDNISolone sodium succinate (SOLU-MEDROL) 125 mg/2 mL injection 60 mg  60 mg Intravenous Q12H Milagros Loll, MD   60 mg at 10/21/15 0717  . mometasone-formoterol (DULERA) 100-5 MCG/ACT inhaler 2 puff  2 puff  Inhalation BID Milagros Loll, MD   2 puff at 10/21/15 0910  . ondansetron (ZOFRAN) tablet 4 mg  4 mg Oral Q6H PRN Srikar Sudini, MD       Or  . ondansetron (ZOFRAN) injection 4 mg  4 mg Intravenous Q6H PRN Milagros Loll, MD      .  pantoprazole (PROTONIX) EC tablet 40 mg  40 mg Oral Daily Milagros Loll, MD   40 mg at 10/21/15 0909  . potassium chloride SA (K-DUR,KLOR-CON) CR tablet 10 mEq  10 mEq Oral Daily Milagros Loll, MD   10 mEq at 10/21/15 0909  . pyridOXINE (VITAMIN B-6) tablet 25 mg  25 mg Oral Daily Milagros Loll, MD   25 mg at 10/21/15 0909  . sodium chloride 0.9 % injection 3 mL  3 mL Intravenous Q12H Milagros Loll, MD   3 mL at 10/21/15 0311  . sodium chloride 0.9 % injection 3 mL  3 mL Intravenous Q12H Milagros Loll, MD   3 mL at 10/21/15 0910  . sodium chloride 0.9 % injection 3 mL  3 mL Intravenous PRN Srikar Sudini, MD      . zolpidem (AMBIEN) tablet 10 mg  10 mg Oral QHS PRN Milagros Loll, MD          Allergies: No Known Allergies    Past Medical History: Past Medical History  Diagnosis Date  . Atrial fibrillation (HCC)   . Hypertension   . Tuberculosis   . Sleep apnea   . COPD (chronic obstructive pulmonary disease) (HCC)     on 5l home oxygen  . Obesity   . Gout   . Hyperlipidemia   . Acute exacerbation of COPD with asthma (HCC) 08/20/2015  . Acute on chronic diastolic CHF (congestive heart failure) (HCC) 08/20/2015     Past Surgical History: Past Surgical History  Procedure Laterality Date  . Bronchoscopy       Family History: Family History  Problem Relation Age of Onset  . Cancer Mother   . Emphysema Father      Social History: Social History   Social History  . Marital Status: Married    Spouse Name: N/A  . Number of Children: N/A  . Years of Education: N/A   Occupational History  . Not on file.   Social History Main Topics  . Smoking status: Never Smoker   . Smokeless tobacco: Not on file  . Alcohol Use: Yes  . Drug Use: No  .  Sexual Activity: Not on file   Other Topics Concern  . Not on file   Social History Narrative     Review of Systems: Review of Systems  Constitutional: Negative.  Negative for fever, chills, weight loss, malaise/fatigue and diaphoresis.  HENT: Negative.  Negative for congestion, ear discharge, ear pain, hearing loss, nosebleeds, sore throat and tinnitus.   Eyes: Negative.  Negative for blurred vision, double vision, photophobia, pain, discharge and redness.  Respiratory: Positive for hemoptysis, shortness of breath and wheezing. Negative for cough, sputum production and stridor.   Cardiovascular: Positive for palpitations, orthopnea, leg swelling and PND. Negative for chest pain and claudication.  Gastrointestinal: Positive for constipation. Negative for heartburn, nausea, vomiting, abdominal pain, diarrhea, blood in stool and melena.  Genitourinary: Negative.  Negative for dysuria, urgency, frequency, hematuria and flank pain.  Musculoskeletal: Negative.  Negative for myalgias, back pain, joint pain, falls and neck pain.  Skin: Negative.  Negative for itching and rash.  Neurological: Negative.  Negative for dizziness, tingling, tremors, sensory change, speech change, focal weakness, seizures, loss of consciousness, weakness and headaches.  Endo/Heme/Allergies: Negative.  Negative for environmental allergies and polydipsia. Does not bruise/bleed easily.  Psychiatric/Behavioral: Negative.  Negative for depression, suicidal ideas, hallucinations, memory loss and substance abuse. The patient is not nervous/anxious and does not have insomnia.     Vital  Signs: Blood pressure 92/70, pulse 33, temperature 97.4 F (36.3 C), temperature source Axillary, resp. rate 34, height  (1.778 m), weight 136.5 kg (300 lb 14.9 oz), SpO2 96 %.  Weight trends: Filed Weights   10/21/15 0300  Weight: 136.5 kg (300 lb 14.9 oz)    Physical Exam: General: NAD, sitting in bed  Head: Normocephalic,  atraumatic. Moist oral mucosal membranes  Eyes: Anicteric, PERRL  Neck: Supple, trachea midline  Lungs:  Diminished bilaterally, +wheezes  Heart: Regular rate and rhythm  Abdomen:  Soft, nontender,   Extremities:  + peripheral edema.  Neurologic: Nonfocal, moving all four extremities  Skin: No lesions        Lab results: Basic Metabolic Panel:  Recent Labs Lab 09/29/2015 2328 10/21/15 0609  NA 137 137  K 3.8 4.3  CL 96* 95*  CO2 29 30  GLUCOSE 109* 112*  BUN 50* 52*  CREATININE 1.73* 1.67*  CALCIUM 9.3 9.3    Liver Function Tests:  Recent Labs Lab 09/26/2015 2328  AST 30  ALT 22  ALKPHOS 68  BILITOT 1.8*  PROT 6.9  ALBUMIN 3.9   No results for input(s): LIPASE, AMYLASE in the last 168 hours. No results for input(s): AMMONIA in the last 168 hours.  CBC:  Recent Labs Lab 10/05/2015 2328 10/21/15 0609  WBC 13.5* 12.3*  NEUTROABS 11.0*  --   HGB 15.4 16.2  HCT 49.0 50.5  MCV 87.1 87.9  PLT 165 166    Cardiac Enzymes:  Recent Labs Lab 10/10/2015 2328  TROPONINI 0.04*    BNP: Invalid input(s): POCBNP  CBG: No results for input(s): GLUCAP in the last 168 hours.  Microbiology: Results for orders placed or performed during the hospital encounter of 09/30/2015  MRSA PCR Screening     Status: None   Collection Time: 10/21/15  2:48 AM  Result Value Ref Range Status   MRSA by PCR NEGATIVE NEGATIVE Final    Comment:        The GeneXpert MRSA Assay (FDA approved for NASAL specimens only), is one component of a comprehensive MRSA colonization surveillance program. It is not intended to diagnose MRSA infection nor to guide or monitor treatment for MRSA infections.     Coagulation Studies: No results for input(s): LABPROT, INR in the last 72 hours.  Urinalysis: No results for input(s): COLORURINE, LABSPEC, PHURINE, GLUCOSEU, HGBUR, BILIRUBINUR, KETONESUR, PROTEINUR, UROBILINOGEN, NITRITE, LEUKOCYTESUR in the last 72 hours.  Invalid input(s):  APPERANCEUR    Imaging: Dg Chest Port 1 View  10/21/2015  CLINICAL DATA:  Acute onset of difficulty breathing. Initial encounter. EXAM: PORTABLE CHEST 1 VIEW COMPARISON:  Chest radiograph from 08/24/2015 FINDINGS: The lungs are hypoexpanded. Vascular congestion is noted. Mildly increased interstitial markings raise concern for mild interstitial edema. There is no evidence of pleural effusion or pneumothorax. The cardiomediastinal silhouette is enlarged. No acute osseous abnormalities are seen. IMPRESSION: Lungs hypoexpanded. Vascular congestion and cardiomegaly. Mildly increased interstitial markings raise concern for mild interstitial edema. Electronically Signed   By: Roanna Raider M.D.   On: 10/21/2015 00:08      Assessment & Plan: Mr. Jonathan BOLGER Sr. is a 54 y.o. black  male with atrial fibrillation, hypertension, history of TB, COPD, OSA on CPAP, gout, hyperlipidemia, and CHF, who was admitted to [facility] on 10/01/2015  1. Chronic Kidney Disease stage III: with history of hypertension, congestive heart failure and atrial fibrillation. Patient has never seen a nephrologist before and does not have any urine studies.  Denies history of diabetes.  Baseline creatinine of 1.5-1.9 since 07/2015 however before that, creatinine was 1.  Not currently on an ACE-I/ARB - check SPEP/UPEP, urinalysis, spot urine protein to creatinine ratio. Renal ultrasound  2. Hypertension: hypotensive. Concerning for cardiomyopathy - home regeimn of torsemide, diltiazem, and atenolol.   3. Congestive Heart Failure: acute exacerbation: diastolic with atrial fibrillation. Unable to view echocardiogram currently.  - agree with high dose IV furosemide - monitor volume status.   4. Acute Exacerbation of COPD: placed on oxygen, nebs and steroids - appreciate pulmonary input.      LOS: 0 Roma Bondar 12/30/20163:13 PM

## 2015-10-21 NOTE — Progress Notes (Signed)
[facility] [place] [facility]   PATIENT NAME: Jonathan [name]    MR#:  [id number]  DATE OF BIRTH:  08/04/1961  SUBJECTIVE: 54 year old male patient with pulmonary tuberculosis history, hypertension, chronic diastolic heart failure admitted because of shortness of breath, acute on chronic congestive heart failure. Patient on IV Lasix but he says that this is not helping him and he is not able to urinate as much. He required BiPAP because of tachypnea and hypoxia: Now he is off the BiPAP.  on 8 L of high flow oxygen at home.   CHIEF COMPLAINT:   Chief Complaint  Patient presents with  . Respiratory Distress    REVIEW OF SYSTEMS:    Review of Systems  Constitutional: Negative for fever and chills.  HENT: Negative for hearing loss.   Eyes: Negative for blurred vision, double vision and photophobia.  Respiratory: Positive for cough and shortness of breath. Negative for hemoptysis.   Cardiovascular: Positive for orthopnea and leg swelling. Negative for palpitations.  Gastrointestinal: Negative for vomiting, abdominal pain and diarrhea.  Genitourinary: Negative for dysuria and urgency.  Musculoskeletal: Negative for myalgias and neck pain.  Skin: Negative for rash.  Neurological: Negative for dizziness, focal weakness, seizures, weakness and headaches.  Psychiatric/Behavioral: Negative for memory loss. The patient does not have insomnia.     Nutrition:  Tolerating Diet: Tolerating PT:      DRUG ALLERGIES:  No Known Allergies  VITALS:  Blood pressure 118/101, pulse 114, temperature 97.5 F (36.4 C), temperature source Oral, resp. rate 30, height 5\' 10"  (1.778 m), weight 136.5 kg (300 lb 14.9 oz), SpO2 100 %.  PHYSICAL EXAMINATION:   Physical Exam  GENERAL:  54 y.o.-year-old patient lying in the bed with no acute distress.  EYES: Pupils equal, round, reactive to light and accommodation. No scleral icterus. Extraocular muscles intact.  HEENT: Head  atraumatic, normocephalic. Oropharynx and nasopharynx clear.  NECK:  Supple, no jugular venous distention. No thyroid enlargement, no tenderness.  LUNGS;bilateral basilar crepitations present no wheezing. Not using accessory muscles of respiration.  CARDIOVASCULAR: S1, S2 normal. Tachycardic. No murmurs, rubs, or gallops.  ABDOMEN: Soft, nontender, nondistended. Bowel sounds present. No organomegaly or mass.  EXTREMITIES: No pedal edema, cyanosis, or clubbing.  NEUROLOGIC: Cranial nerves II through XII are intact. Muscle strength 5/5 in all extremities. Sensation intact. Gait not checked.  PSYCHIATRIC: The patient is alert and oriented x 3.  SKIN: No obvious rash, lesion, or ulcer.    LABORATORY PANEL:   CBC  Recent Labs Lab 10/21/15 0609  WBC 12.3*  HGB 16.2  HCT 50.5  PLT 166   ------------------------------------------------------------------------------------------------------------------  Chemistries   Recent Labs Lab 01-Oct-2015 2328 10/21/15 0609  NA 137 137  K 3.8 4.3  CL 96* 95*  CO2 29 30  GLUCOSE 109* 112*  BUN 50* 52*  CREATININE 1.73* 1.67*  CALCIUM 9.3 9.3  AST 30  --   ALT 22  --   ALKPHOS 68  --   BILITOT 1.8*  --    ------------------------------------------------------------------------------------------------------------------  Cardiac Enzymes  Recent Labs Lab 01-Oct-2015 2328  TROPONINI 0.04*   ------------------------------------------------------------------------------------------------------------------  RADIOLOGY:  Dg Chest Port 1 View  10/21/2015  CLINICAL DATA:  Acute onset of difficulty breathing. Initial encounter. EXAM: PORTABLE CHEST 1 VIEW COMPARISON:  Chest radiograph from 08/24/2015 FINDINGS: The lungs are hypoexpanded. Vascular congestion is noted. Mildly increased interstitial markings raise concern for mild interstitial edema. There is no evidence of pleural effusion or pneumothorax. The cardiomediastinal  silhouette is enlarged.  No acute osseous abnormalities are seen. IMPRESSION: Lungs hypoexpanded. Vascular congestion and cardiomegaly. Mildly increased interstitial markings raise concern for mild interstitial edema. Electronically Signed   By: Roanna Raider M.D.   On: 10/21/2015 00:08     ASSESSMENT AND PLAN:   Active Problems:   CHF (congestive heart failure) (HCC)   Acute on chronic respiratory failure (HCC)   COPD exacerbation (HCC)   #1, acute on chronic diastolic heart failure: Continue IV Lasix, check daily weights in and outs. 2.Acute on chronic respiratory failure; due to COPD evaluation: Continue IV steroids, antibiotics, nebulizers. His BiPAP as needed. High risk for intubation. Pulmonary following. #3. Chronic kidney disease stage III: Creatinine is 1.67 at baseline and her GFR is around 49. Patient follows up with Dr. Mosetta Pigeon, we will consult nephrology. For history of pulmonary tuberculosis years ago;finished treatment. #5 chronic obstructive sleep apnea continue CPAP at night.  Patient is at high risk for cardiac arrest.  All the records are reviewed and case discussed with Care Management/Social Workerr. Management plans discussed with the patient, family and they are in agreement.  CODE STATUS: Full  TOTAL TIME TAKING CARE OF THIS PATIENT: 40 minutes.(CCT)  POSSIBLE D/C IN 3-4DAYS, DEPENDING ON CLINICAL CONDITION.   Katha Hamming M.D on 10/21/2015 at 12:29 PM  Between 7am to 6pm - Pager - [phone]  After 6pm go to [email or web address] - password [email or web address]  [facility]  Office  [phone]  CC: Primary care physician; Leotis Shames, MD

## 2015-10-21 NOTE — Progress Notes (Signed)
Pt alert and oriented currently on cpap with O2 sats upper 90's; vss; marginal uop via urinal, bladder scan showed 0 ml of urine in bladder nephrology consulted; vss; no c/o pain; pt status changed to stepdown per Dr. Clovis FredricksonKasa's verbal orders will continue to monitor and assess pt

## 2015-10-21 NOTE — Progress Notes (Signed)
Per DSS patient is not registered for Medicaid transportation and will need to call and request referral packet. Clinical Social Worker (CSW) met with patient this afternoon and made him aware of above. CSW also gave patient other transportation resources including [employer] and [employer]. CSW will continue to follow and assist as needed.   Blima Rich, [place] [phone]

## 2015-10-21 NOTE — Progress Notes (Signed)
eLink Physician-Brief Progress Note Patient Name: Jonathan PaceKelvin D Kading Sr. DOB: 11/16/1960 MRN: [id number]   Date of Service  10/21/2015  HPI/Events of Note  [age] M with MMP including AF, h/o of TB with pulm fibrosis, COPD on home O2.  Presenting with resp distress found to have CHF on PCXR.  Placed on BiPAP in ED.  Now in ICU on 6L [place] with sats in the mid-80s.  HD stable.  Patient is alert in NAD.  eICU Interventions  Plan of care per primary admitting team.  Has received Bumex IV and is to be placed on BiPAP.  Patient is a full code [facility]     Intervention Category Evaluation Type: New Patient Evaluation  DETERDING,ELIZABETH 10/21/2015, 2:53 AM

## 2015-10-21 NOTE — H&P (Addendum)
[facility] [place] [facility]   PATIENT NAME: Jonathan [name]    MR#:  [id number]  DATE OF BIRTH:  1961/02/12  DATE OF ADMISSION:  2015-11-16  PRIMARY CARE PHYSICIAN: Leotis Shames, MD   REQUESTING/REFERRING PHYSICIAN: Dr. Dolores Frame  CHIEF COMPLAINT:   Chief Complaint  Patient presents with  . Respiratory Distress    HISTORY OF PRESENT ILLNESS:  Jonathan [name]  is a 54 y.o. male with a known history of pulmonary tuberculosis, pulmonary fibrosis, COPD, hypertension, chronic diastolic congestive heart failure presents to the emergency room with worsening shortness of breath and lower extremity edema of 2 weeks. Patient was last seen in the hospital in November 2016 and treated for CHF and COPD and discharged home. He mentions he is on 8 L to high flow nasal cannula at home. Has not been able to follow-up with pulmonary or cardiology due to insurance issues. Has been compliant with medications. He called his physician Dr. Thedore Mins at [facility] regarding worsening shortness of breath and edema and was advised to take increased dose of torsemide which has not helped with diuresis. Today the emergency room he was found to have severe severe tachypnea along with chest x-ray showing CHF and is being admitted to the hospital. He has cough, wheezing, orthopnea, lower extremity edema.  PAST MEDICAL HISTORY:   Past Medical History  Diagnosis Date  . Atrial fibrillation (HCC)   . Hypertension   . Tuberculosis   . Sleep apnea   . COPD (chronic obstructive pulmonary disease) (HCC)     on 5l home oxygen  . Obesity   . Gout   . Hyperlipidemia   . Acute exacerbation of COPD with asthma (HCC) 08/20/2015  . Acute on chronic diastolic CHF (congestive heart failure) (HCC) 08/20/2015    PAST SURGICAL HISTORY:   Past Surgical History  Procedure Laterality Date  . Bronchoscopy      SOCIAL HISTORY:   Social History  Substance Use Topics  . Smoking status: Never  Smoker   . Smokeless tobacco: Not on file  . Alcohol Use: Yes    FAMILY HISTORY:   Family History  Problem Relation Age of Onset  . Cancer Mother   . Emphysema Father     DRUG ALLERGIES:  No Known Allergies  REVIEW OF SYSTEMS:   Review of Systems  Constitutional: Positive for malaise/fatigue. Negative for fever, chills and weight loss.  HENT: Negative for hearing loss and nosebleeds.   Eyes: Negative for blurred vision, double vision and pain.  Respiratory: Positive for cough, shortness of breath and wheezing. Negative for hemoptysis and sputum production.   Cardiovascular: Positive for orthopnea and leg swelling. Negative for chest pain and palpitations.  Gastrointestinal: Negative for nausea, vomiting, abdominal pain, diarrhea and constipation.  Genitourinary: Negative for dysuria and hematuria.  Musculoskeletal: Positive for back pain. Negative for myalgias and falls.  Skin: Negative for rash.  Neurological: Positive for weakness. Negative for dizziness, tremors, sensory change, speech change, focal weakness, seizures and headaches.  Endo/Heme/Allergies: Does not bruise/bleed easily.  Psychiatric/Behavioral: Negative for depression and memory loss. The patient is not nervous/anxious.     MEDICATIONS AT HOME:   Prior to Admission medications   Medication Sig Start Date End Date Taking? Authorizing Provider  acetaminophen (TYLENOL) 325 MG tablet Take 2 tablets (650 mg total) by mouth every 6 (six) hours as needed for mild pain (or Fever >/= 101). 08/26/15  Yes Leotis Shames, MD  albuterol (PROVENTIL) (2.5 MG/3ML) 0.083% nebulizer solution  Take 3 mLs (2.5 mg total) by nebulization every 4 (four) hours as needed for wheezing or shortness of breath. 08/26/15  Yes Leotis ShamesJasmine Singh, MD  allopurinol (ZYLOPRIM) 300 MG tablet Take 300 mg by mouth daily.   Yes Historical Provider, MD  atenolol (TENORMIN) 50 MG tablet Take 50 mg by mouth daily.   Yes Historical Provider, MD  budesonide  (PULMICORT) 0.25 MG/2ML nebulizer solution Take 2 mLs (0.25 mg total) by nebulization 2 (two) times daily. 08/26/15  Yes Leotis ShamesJasmine Singh, MD  colchicine 0.6 MG tablet Take 0.6 mg by mouth 2 (two) times daily as needed (for gout flares).    Yes Historical Provider, MD  dabigatran (PRADAXA) 150 MG CAPS capsule Take 1 capsule (150 mg total) by mouth every 12 (twelve) hours. 08/26/15  Yes Leotis ShamesJasmine Singh, MD  diltiazem (CARDIZEM CD) 240 MG 24 hr capsule Take 240 mg by mouth daily.   Yes Historical Provider, MD  fluticasone (FLONASE) 50 MCG/ACT nasal spray Place 2 sprays into both nostrils daily.    Yes Historical Provider, MD  ipratropium-albuterol (DUONEB) 0.5-2.5 (3) MG/3ML SOLN Take 3 mLs by nebulization every 6 (six) hours. 08/26/15  Yes Leotis ShamesJasmine Singh, MD  mometasone-formoterol (DULERA) 100-5 MCG/ACT AERO Inhale 2 puffs into the lungs 2 (two) times daily. 08/26/15  Yes Leotis ShamesJasmine Singh, MD  potassium chloride (K-DUR,KLOR-CON) 10 MEQ tablet Take 1 tablet (10 mEq total) by mouth daily. 08/26/15  Yes Leotis ShamesJasmine Singh, MD  predniSONE (DELTASONE) 10 MG tablet Take 10 mg by mouth daily.   Yes Historical Provider, MD  torsemide (DEMADEX) 20 MG tablet Take 40 mg by mouth daily.   Yes Historical Provider, MD  vitamin B-6 (PYRIDOXINE) 25 MG tablet Take 25 mg by mouth daily.   Yes Historical Provider, MD  zolpidem (AMBIEN) 5 MG tablet Take 1 tablet (5 mg total) by mouth at bedtime as needed for sleep. Patient taking differently: Take 10 mg by mouth at bedtime as needed for sleep.  08/26/15  Yes Leotis ShamesJasmine Singh, MD  levofloxacin (LEVAQUIN) 500 MG tablet Take 1 tablet (500 mg total) by mouth daily. Patient not taking: Reported on 10/21/2015 08/26/15   Leotis ShamesJasmine Singh, MD  pantoprazole (PROTONIX) 40 MG tablet Take 1 tablet (40 mg total) by mouth daily. Patient not taking: Reported on 10/21/2015 08/26/15   Leotis ShamesJasmine Singh, MD  predniSONE (DELTASONE) 20 MG tablet Take 3 tablets daily for 4 days then 2 tablets daily for 4 days then one  tablet daily for 4 days. Patient not taking: Reported on 10/21/2015 08/26/15   Leotis ShamesJasmine Singh, MD      VITAL SIGNS:  Blood pressure 112/67, pulse 91, temperature 97.6 F (36.4 C), resp. rate 46, SpO2 97 %.  PHYSICAL EXAMINATION:  Physical Exam  GENERAL:  54 y.o.-year-old patient lying in the bed with respiratory distress and looking critically ill. Morbidly obese. EYES: Pupils equal, round, reactive to light and accommodation. No scleral icterus. Extraocular muscles intact.  HEENT: Head atraumatic, normocephalic. Oropharynx and nasopharynx clear. No oropharyngeal erythema, moist oral mucosa  NECK:  Supple, no jugular venous distention. No thyroid enlargement, no tenderness.  LUNGS: Poor air entry bilaterally with wheezing and crackles. Accessory muscle use. CARDIOVASCULAR: S1, S2 normal. No murmurs, rubs, or gallops.  ABDOMEN: Soft, nontender, nondistended. Bowel sounds present. No organomegaly or mass.  EXTREMITIES: No  cyanosis, or clubbing. + 2 pedal & radial pulses b/l.  Bilateral 3+ lower extremity edema. NEUROLOGIC: Cranial nerves II through XII are intact. No focal Motor or sensory deficits appreciated b/l PSYCHIATRIC:  The patient is alert and oriented x 3. Good affect.  SKIN: No obvious rash, lesion, or ulcer.   LABORATORY PANEL:   CBC  Recent Labs Lab 10/19/2015 2328  WBC 13.5*  HGB 15.4  HCT 49.0  PLT 165   ------------------------------------------------------------------------------------------------------------------  Chemistries   Recent Labs Lab 09/27/2015 2328  NA 137  K 3.8  CL 96*  CO2 29  GLUCOSE 109*  BUN 50*  CREATININE 1.73*  CALCIUM 9.3  AST 30  ALT 22  ALKPHOS 68  BILITOT 1.8*   ------------------------------------------------------------------------------------------------------------------  Cardiac Enzymes  Recent Labs Lab 10/11/2015 2328  TROPONINI 0.04*    ------------------------------------------------------------------------------------------------------------------  RADIOLOGY:  Dg Chest Port 1 View  10/21/2015  CLINICAL DATA:  Acute onset of difficulty breathing. Initial encounter. EXAM: PORTABLE CHEST 1 VIEW COMPARISON:  Chest radiograph from 08/24/2015 FINDINGS: The lungs are hypoexpanded. Vascular congestion is noted. Mildly increased interstitial markings raise concern for mild interstitial edema. There is no evidence of pleural effusion or pneumothorax. The cardiomediastinal silhouette is enlarged. No acute osseous abnormalities are seen. IMPRESSION: Lungs hypoexpanded. Vascular congestion and cardiomegaly. Mildly increased interstitial markings raise concern for mild interstitial edema. Electronically Signed   By: Roanna Raider M.D.   On: 10/21/2015 00:08     IMPRESSION AND PLAN:   Jonathan [name] is a 54 y.o. male with a known history of chronic COPD, lives on 5 L of oxygen at home, chronic congestive heart failure, obstructive sleep apnea, chronic atrial fibrillation currently on Coumadin and multiple other medical problems is present into the ED with a chief complaint of worsening of shortness of breath. As per EMS patient was on 8 L of oxygen via nasal cannula. Patient was placed on Ventimask with FiO2 50% and his SPO2 was 65% respiratory rate was 40 and heart rate was 120. Patient is brought into the ED he was placed on BiPAP. Solum Medrol 125 mg IV was given and Lasix 40 mg IV was given as chest x-ray has revealed mild interstitial edema. Hospitalist team is called to admit the patient  1. Acute on chronic hypoxic respiratory failure secondary to acute exacerbation of CHF and COPD exacerbation  He is tachypneic breathing at 30-35/min with poor air entry. Will place patient on BiPAP. His breathing improves. Pulmonology consult is placed to Dr. Rosetta Posner to nasal cannula as tolerated. As per patient he is on high flow at 8 L/min  at home.  2. Acute exacerbation of CHF - IV Lasix, Beta blockers - Input and Output - Counseled to limit fluids and Salt - Monitor Bun/Cr and Potassium -Cardiology follow up after discharge  3. Acute exacerbation of COPD. -IV steroids, Antibiotics - Scheduled Nebulizers - Inhalers -Wean O2 as tolerated - Consult pulmonary  4. Chronic atrial fibrillation We'll continue beta blocker. Continue Pradaxa. Hold Cardizem as patient has borderline low blood pressure.  5. CKD stage III Monitor creatinine as patient is being diuresed.  6. Chronic history of obstructive sleep apnea He uses CPAP at night.  7. Hyperlipidemia Continue statin  8.  history of tuberculosis Patient reports that he has finished TB treatment with 3 medications for 6 months including isoniazid rifampicin and PZN Denies any cough or unintentional weight loss  9. DVT prophylaxis - on Pradaxa    All the records are reviewed and case discussed with ED provider. Management plans discussed with the patient, family and they are in agreement.  CODE STATUS: FULL  TOTAL CRITICAL CARE TIME TAKING CARE OF THIS PATIENT: 45 minutes.  High risk for cardiac arrest and death. Overall patient seems to have worsened in the last year and has poor prognosis long-term.   Milagros Loll R M.D on 10/21/2015 at 1:27 AM  Between 7am to 6pm - Pager - [phone]  After 6pm go to [email or web address] - password [email or web address]  [facility]  Office  [phone]  CC: Primary care physician; Leotis Shames, MD   Note: This dictation was prepared with Dragon dictation along with smaller phrase technology. Any transcriptional errors that result from this process are unintentional.

## 2015-10-21 NOTE — H&P (Signed)
PULMONARY CRITICAL CARE  PATIENT NAME: Jonathan [name]    MR#:  [id number]  DATE OF BIRTH:  [birth year]  DATE OF ADMISSION:  10/19/2015  PRIMARY CARE PHYSICIAN: Leotis Shames, MD   REQUESTING/REFERRING PHYSICIAN: Dr. Meredeth Ide  CHIEF COMPLAINT:   Chief Complaint  Patient presents with  . Respiratory Distress    HISTORY OF PRESENT ILLNESS:  Jonathan [name]  is a 54 y.o. male with a known history of pulmonary tuberculosis, pulmonary fibrosis, COPD, hypertension, chronic diastolic congestive heart failure presents to the emergency room with worsening shortness of breath and lower extremity edema of 2 weeks.  Patient previous admission  in November 2016 and treated for CHF and COPD and discharged home.  he is on 8 L to high flow nasal cannula at home.  Patient with severe end stage lung disease, patient feeling bad, now on biPAP unable to provide ROS due to severe SOB.   He has associated cough, wheezing, orthopnea, lower extremity edema.  PAST MEDICAL HISTORY:   Past Medical History  Diagnosis Date  . Atrial fibrillation (HCC)   . Hypertension   . Tuberculosis   . Sleep apnea   . COPD (chronic obstructive pulmonary disease) (HCC)     on 5l home oxygen  . Obesity   . Gout   . Hyperlipidemia   . Acute exacerbation of COPD with asthma (HCC) 08/20/2015  . Acute on chronic diastolic CHF (congestive heart failure) (HCC) 08/20/2015    PAST SURGICAL HISTORY:   Past Surgical History  Procedure Laterality Date  . Bronchoscopy      SOCIAL HISTORY:   Social History  Substance Use Topics  . Smoking status: Never Smoker   . Smokeless tobacco: Not on file  . Alcohol Use: Yes    FAMILY HISTORY:   Family History  Problem Relation Age of Onset  . Cancer Mother   . Emphysema Father     DRUG ALLERGIES:  No Known Allergies  REVIEW OF SYSTEMS:   Review of Systems  Unable to perform ROS: critical illness  Respiratory: Positive for cough, shortness of breath and wheezing.  Negative for hemoptysis and sputum production.   Cardiovascular: Positive for orthopnea and leg swelling.  Psychiatric/Behavioral: The patient is nervous/anxious.     MEDICATIONS AT HOME:   Prior to Admission medications   Medication Sig Start Date End Date Taking? Authorizing Provider  acetaminophen (TYLENOL) 325 MG tablet Take 2 tablets (650 mg total) by mouth every 6 (six) hours as needed for mild pain (or Fever >/= 101). 08/26/15  Yes Leotis Shames, MD  albuterol (PROVENTIL) (2.5 MG/3ML) 0.083% nebulizer solution Take 3 mLs (2.5 mg total) by nebulization every 4 (four) hours as needed for wheezing or shortness of breath. 08/26/15  Yes Leotis Shames, MD  allopurinol (ZYLOPRIM) 300 MG tablet Take 300 mg by mouth daily.   Yes Historical Provider, MD  atenolol (TENORMIN) 50 MG tablet Take 50 mg by mouth daily.   Yes Historical Provider, MD  budesonide (PULMICORT) 0.25 MG/2ML nebulizer solution Take 2 mLs (0.25 mg total) by nebulization 2 (two) times daily. 08/26/15  Yes Leotis Shames, MD  colchicine 0.6 MG tablet Take 0.6 mg by mouth 2 (two) times daily as needed (for gout flares).    Yes Historical Provider, MD  dabigatran (PRADAXA) 150 MG CAPS capsule Take 1 capsule (150 mg total) by mouth every 12 (twelve) hours. 08/26/15  Yes Leotis Shames, MD  diltiazem (CARDIZEM CD) 240 MG 24 hr capsule Take 240 mg by mouth daily.  Yes Historical Provider, MD  fluticasone (FLONASE) 50 MCG/ACT nasal spray Place 2 sprays into both nostrils daily.    Yes Historical Provider, MD  ipratropium-albuterol (DUONEB) 0.5-2.5 (3) MG/3ML SOLN Take 3 mLs by nebulization every 6 (six) hours. 08/26/15  Yes Leotis Shames, MD  mometasone-formoterol (DULERA) 100-5 MCG/ACT AERO Inhale 2 puffs into the lungs 2 (two) times daily. 08/26/15  Yes Leotis Shames, MD  potassium chloride (K-DUR,KLOR-CON) 10 MEQ tablet Take 1 tablet (10 mEq total) by mouth daily. 08/26/15  Yes Leotis Shames, MD  predniSONE (DELTASONE) 10 MG tablet Take 10 mg  by mouth daily.   Yes Historical Provider, MD  torsemide (DEMADEX) 20 MG tablet Take 40 mg by mouth daily.   Yes Historical Provider, MD  vitamin B-6 (PYRIDOXINE) 25 MG tablet Take 25 mg by mouth daily.   Yes Historical Provider, MD  zolpidem (AMBIEN) 5 MG tablet Take 1 tablet (5 mg total) by mouth at bedtime as needed for sleep. Patient taking differently: Take 10 mg by mouth at bedtime as needed for sleep.  08/26/15  Yes Leotis Shames, MD  levofloxacin (LEVAQUIN) 500 MG tablet Take 1 tablet (500 mg total) by mouth daily. Patient not taking: Reported on 10/21/2015 08/26/15   Leotis Shames, MD  pantoprazole (PROTONIX) 40 MG tablet Take 1 tablet (40 mg total) by mouth daily. Patient not taking: Reported on 10/21/2015 08/26/15   Leotis Shames, MD  predniSONE (DELTASONE) 20 MG tablet Take 3 tablets daily for 4 days then 2 tablets daily for 4 days then one tablet daily for 4 days. Patient not taking: Reported on 10/21/2015 08/26/15   Leotis Shames, MD      VITAL SIGNS:  Blood pressure 100/79, pulse 112, temperature 97.5 F (36.4 C), temperature source Oral, resp. rate 40, height  (1.778 m), weight 300 lb 14.9 oz (136.5 kg), SpO2 85 %.  PHYSICAL EXAMINATION:  Physical Exam  Constitutional: He appears distressed.  HENT:  Head: Normocephalic and atraumatic.  Eyes: Pupils are equal, round, and reactive to light. No scleral icterus.  Neck: Normal range of motion. Neck supple.  Cardiovascular: Normal rate, regular rhythm and normal heart sounds.   Pulmonary/Chest: He is in respiratory distress. He has wheezes. He has rales.  Abdominal: Soft. Bowel sounds are normal.  Musculoskeletal: He exhibits edema.  Neurological: He is alert. No cranial nerve deficit.  Skin: Skin is warm. He is diaphoretic.      LABORATORY PANEL:   CBC  Recent Labs Lab 10/21/15 0609  WBC 12.3*  HGB 16.2  HCT 50.5  PLT 166    ------------------------------------------------------------------------------------------------------------------  Chemistries   Recent Labs Lab 11-15-2015 2328 10/21/15 0609  NA 137 137  K 3.8 4.3  CL 96* 95*  CO2 29 30  GLUCOSE 109* 112*  BUN 50* 52*  CREATININE 1.73* 1.67*  CALCIUM 9.3 9.3  AST 30  --   ALT 22  --   ALKPHOS 68  --   BILITOT 1.8*  --    ------------------------------------------------------------------------------------------------------------------  Cardiac Enzymes  Recent Labs Lab Nov 15, 2015 2328  TROPONINI 0.04*   ------------------------------------------------------------------------------------------------------------------  RADIOLOGY:  Dg Chest Port 1 View  10/21/2015  CLINICAL DATA:  Acute onset of difficulty breathing. Initial encounter. EXAM: PORTABLE CHEST 1 VIEW COMPARISON:  Chest radiograph from 08/24/2015 FINDINGS: The lungs are hypoexpanded. Vascular congestion is noted. Mildly increased interstitial markings raise concern for mild interstitial edema. There is no evidence of pleural effusion or pneumothorax. The cardiomediastinal silhouette is enlarged. No acute osseous abnormalities are seen.  IMPRESSION: Lungs hypoexpanded. Vascular congestion and cardiomegaly. Mildly increased interstitial markings raise concern for mild interstitial edema. Electronically Signed   By: Roanna RaiderJeffery  Chang M.D.   On: 10/21/2015 00:08     IMPRESSION AND PLAN:   54 y.o. male with a known history of chronic COPD, lives on 8 L of oxygen at home, chronic congestive heart failure, obstructive sleep apnea, chronic atrial fibrillation currently on Coumadin and multiple other medical problems is present into the ED with a chief complaint of worsening of shortness of breath.  Patient with end stage lung disease with very poor resp status. 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 Acute exacerbation of CHF - IV Lasix, Beta blockers - Input and Output - Counseled to limit fluids and Salt - Monitor Bun/Cr and Potassium -Cardiology follow up  2. Acute exacerbation of COPD. -IV steroids, Antibiotics - Scheduled Nebulizers - Inhalers biPAP as needed -high risk for inubation  3.CKD stage III Monitor creatinine as patient is being diuresed.  4.Chronic history of obstructive sleep apnea He uses CPAP at night.    I have personally obtained a history, examined the patient, evaluated Pertinent laboratory and RadioGraphic/imaging results, and  formulated the assessment and plan   The Patient requires high complexity decision making for assessment and support, frequent evaluation and titration of therapies, application of advanced monitoring technologies and extensive interpretation of multiple databases. Critical Care Time devoted to patient care services described in this note is 45 minutes.   Overall, patient is critically ill, prognosis is guarded.  Patient with Multiorgan failure and at high risk for cardiac arrest and death.    Lucie LeatherKurian David Avila Albritton, M.D.  [facility]  Medical Director [facility] Medical Director [facility] Card[facility]-P[facility] Department

## 2015-10-21 NOTE — ED Notes (Signed)
Dr. Sung at bedside.  

## 2015-10-21 NOTE — Clinical Social Work Note (Signed)
Clinical Social Work Assessment  Patient Details  Name: Jonathan [name]. MRN: [id number] Date of Birth: 1961/07/13  Date of referral:  10/21/15               Reason for consult:  Insurance Barriers, Medication Concerns (Issues with insurance and medications)                Permission sought to share information with:  Case Manager, Family Supports Permission granted to share information::  Yes, Verbal Permission Granted  Name::        Agency::   ([facility])  Relationship::     Contact Information:     Housing/Transportation Living arrangements for the past 2 months:  [place] of Information:  Patient Patient Interpreter Needed:  None Criminal Activity/Legal Involvement Pertinent to Current Situation/Hospitalization:  No - Comment as needed Significant Relationships:  Spouse Presenter, broadcasting (Wife) ) Lives with:  Spouse Do you feel safe going back to the place where you live?  Yes Need for family participation in patient care:  No (Coment)  Care giving concerns: Patient expressed concerns about his Medicaid and transportation to medical appointments.    Social Worker assessment / plan:  CSW was consulted by RN due to some patients concerns surrounding his insurance and Medications. CSW met with patient at bedside. Patient was alert and oriented. Patient reports that he lives with his wife. Per patient he has not gone to the doctor's in 2 months because his medicaid was inactive. He reports he found out his medicaid was active when he went to the pharmacy to pick up his medications. Patient reports that he is not able to make his appointments because he does not have transportation. Per patient he has been trying to get in touch with his Medicaid worker for the past two months and she has not returned his phone calls. CSW informed patient of the process to establish Medicaid transportation. Patient reports that he doesn't feel his  Medicaid worker will not respond to his voicemail's and doesn't feel confident that this service will be established. CSW gained verbal permission to contact [place] (DSS) to assist with transportation concerns. Patient agreed. Patient reports that he didn't need any other assistance at this time.   CSW contacted Medicaid Transportation at [place] to assist with transportation concerns. CSW will continue to follow and assist as needed.   Employment status:  Disabled (Comment on whether or not currently receiving Disability) Insurance information:  Medicaid [place] PT Recommendations:  Not assessed at this time Information / Referral to community resources:  Other (Comment Required) ([facility] Transportation) )  Patient/Family's Response to care:  Patient is in agreeable for CSW to contact [place] to assist with transportation concerns.   Patient/Family's Understanding of and Emotional Response to Diagnosis, Current Treatment, and Prognosis:  Patient was frustrated that he has not received communication from [facility]. Patient participated in CSW's assessment and was agreeable to CSW's assistance with transportation concerns.   Emotional Assessment Appearance:  Appears stated age Attitude/Demeanor/Rapport:   (None) Affect (typically observed):  Frustrated Orientation:  Oriented to Self, Oriented to Place, Oriented to  Time, Oriented to Situation Alcohol / Substance use:  Not Applicable Psych involvement (Current and /or in the community):  No (Comment)  Discharge Needs  Concerns to be addressed:  Financial / Insurance Concerns (Transportation Concerns ) Readmission within the last 30 days:  No Current discharge risk:  Chronically ill Barriers to Discharge:  Continued Medical Work up   [employer], LCSW 10/21/2015, 10:18 AM

## 2015-10-22 ENCOUNTER — Inpatient Hospital Stay
Admit: 2015-10-22 | Discharge: 2015-10-22 | Disposition: A | Discharge status: Home / Self Care | Payer: Medicaid Other | Attending: Internal Medicine | Admitting: Internal Medicine

## 2015-10-22 LAB — BASIC METABOLIC PANEL
Anion gap: 16 — ABNORMAL HIGH (ref 5–15)
BUN: 67 mg/dL — AB (ref 6–20)
CHLORIDE: 96 mmol/L — AB (ref 101–111)
CO2: 23 mmol/L (ref 22–32)
CREATININE: 2.5 mg/dL — AB (ref 0.61–1.24)
Calcium: 9.4 mg/dL (ref 8.9–10.3)
GFR, EST AFRICAN AMERICAN: 31 mL/min — AB (ref >60)
GFR, EST NON AFRICAN AMERICAN: 27 mL/min — AB (ref >60)
Glucose, Bld: 130 mg/dL — ABNORMAL HIGH (ref 65–99)
POTASSIUM: 4.8 mmol/L (ref 3.5–5.1)
SODIUM: 135 mmol/L (ref 135–145)

## 2015-10-22 LAB — GLUCOSE, CAPILLARY: GLUCOSE-CAPILLARY: 74 mg/dL (ref 65–99)

## 2015-10-22 LAB — PARATHYROID HORMONE, INTACT (NO CA): PTH: 153 pg/mL — AB (ref 15–65)

## 2015-10-22 MED ORDER — ALPRAZOLAM 0.5 MG PO TABS
0.5000 mg | ORAL_TABLET | Freq: Two times a day (BID) | ORAL | Status: DC | PRN
  Administered 2015-10-22: 0.5 mg via ORAL
  Filled 2015-10-22: qty 1

## 2015-10-23 NOTE — Progress Notes (Signed)
1450 patients pulse started to decrease. 2nd coded. See code sheet. 1458 code stopped per Dr. Belia HemanKasa

## 2015-10-23 NOTE — ED Provider Notes (Signed)
Patient had no overhead call blue. I responded to the intensive care unit and the patient at that time was in pulseless electrical activity. Patient had airway set up established and compressions were initiated by the nursing staff. Patient received IV fluid along with epinephrine and were able to establish a pulse after a short period of chest compressions. He is still unresponsive and I felt required intubation. His jaw is somewhat tight and I felt it would be an easier intubation with paralytics. No sedation was necessary and I felt would contribute to his hypotension. Patient had initial pass after succinylcholine and checking of his potassium levels which were normal. The patient most likely had initially an esophageal intubation which was identified by physical exam . I did not have good visualization. The tube was withdrawn and a second attempt established with good cord visualization after repositioning. Tube position was verified with CO2 change along with physical exam. Patient continued to be in a sinus tachycardia and CO2 monitoring. Patient was turned over to the pulmonologist.  Jennye MoccasinBrian S Ladashia Demarinis, MD 10/01/2015 [phone]

## 2015-10-23 NOTE — Progress Notes (Signed)
[facility]  ROUNDING NOTE   Subjective:   On CPAP this morning.  Hypotensive.  UOP recorded at 250 - furosemide discontinued. Given this morning at 8.   Objective:  Vital signs in last 24 hours:  Temp:  [97.4 F (36.3 C)-98.3 F (36.8 C)] 98.3 F (36.8 C) (12/31 0800) Pulse Rate:  [29-114] 69 (12/31 0800) Resp:  [22-43] 31 (12/31 0800) BP: (77-118)/(29-101) 99/74 mmHg (12/31 0800) SpO2:  [86 %-100 %] 98 % (12/31 0816) FiO2 (%):  [60 %] 60 % (12/31 0816) Weight:  [141.2 kg (311 lb 4.6 oz)] 141.2 kg (311 lb 4.6 oz) (12/31 0428)  Weight change: 4.7 kg (10 lb 5.8 oz) Filed Weights   10/21/15 0300 10/21/2015 0428  Weight: 136.5 kg (300 lb 14.9 oz) 141.2 kg (311 lb 4.6 oz)    Intake/Output: I/O last 3 completed shifts: In: -  Out: 550 [Urine:550]   Intake/Output this shift:     Physical Exam: General: Critically ill  Head: Normocephalic, atraumatic. Moist oral mucosal membranes  Eyes: Anicteric, PERRL  Neck: Supple, trachea midline  Lungs:  Diminished bilaterally, CPAP 11  Heart: Irregular, tachycardia  Abdomen:  Soft, nontender, obese  Extremities: 2+ peripheral edema.  Neurologic: Nonfocal, moving all four extremities  Skin: No lesions       Basic Metabolic Panel:  Recent Labs Lab 09/23/2015 2328 10/21/15 0609 10/21/15 1547 10/19/2015 0443  NA 137 137  --  135  K 3.8 4.3  --  4.8  CL 96* 95*  --  96*  CO2 29 30  --  23  GLUCOSE 109* 112*  --  130*  BUN 50* 52*  --  67*  CREATININE 1.73* 1.67*  --  2.50*  CALCIUM 9.3 9.3  --  9.4  PHOS  --   --  5.7*  --     Liver Function Tests:  Recent Labs Lab 10/19/2015 2328  AST 30  ALT 22  ALKPHOS 68  BILITOT 1.8*  PROT 6.9  ALBUMIN 3.9   No results for input(s): LIPASE, AMYLASE in the last 168 hours. No results for input(s): AMMONIA in the last 168 hours.  CBC:  Recent Labs Lab 10/07/2015 2328 10/21/15 0609  WBC 13.5* 12.3*  NEUTROABS 11.0*  --   HGB 15.4 16.2  HCT 49.0 50.5  MCV  87.1 87.9  PLT 165 166    Cardiac Enzymes:  Recent Labs Lab 09/26/2015 2328  TROPONINI 0.04*    BNP: Invalid input(s): POCBNP  CBG: No results for input(s): GLUCAP in the last 168 hours.  Microbiology: Results for orders placed or performed during the hospital encounter of 10/05/2015  MRSA PCR Screening     Status: None   Collection Time: 10/21/15  2:48 AM  Result Value Ref Range Status   MRSA by PCR NEGATIVE NEGATIVE Final    Comment:        The GeneXpert MRSA Assay (FDA approved for NASAL specimens only), is one component of a comprehensive MRSA colonization surveillance program. It is not intended to diagnose MRSA infection nor to guide or monitor treatment for MRSA infections.     Coagulation Studies: No results for input(s): LABPROT, INR in the last 72 hours.  Urinalysis: No results for input(s): COLORURINE, LABSPEC, PHURINE, GLUCOSEU, HGBUR, BILIRUBINUR, KETONESUR, PROTEINUR, UROBILINOGEN, NITRITE, LEUKOCYTESUR in the last 72 hours.  Invalid input(s): APPERANCEUR    Imaging: [place] Renal  10/21/2015  CLINICAL DATA:  Stage III chronic renal disease EXAM: RENAL / URINARY TRACT ULTRASOUND COMPLETE  COMPARISON:  None. FINDINGS: Right Kidney: Length: 9.8 cm. Echogenicity and renal cortical thickness are within normal limits. No mass, perinephric fluid, or hydronephrosis visualized. There is no sonographically demonstrable calculus or ureterectasis. Left Kidney: Length: 10.6 cm. Echogenicity and renal cortical thickness are within normal limits. No perinephric fluid or hydronephrosis visualized. There is a simple cyst arising from the lateral mid kidney measuring 3.9 x 4.4 x 4.1 cm. No sonographically demonstrable calculus or ureterectasis. Bladder: Appears normal for degree of bladder distention. IMPRESSION: Cyst arising from midportion of left kidney. Study otherwise unremarkable. Electronically Signed   By: Bretta Bang III M.D.   On: 10/21/2015 17:05   Dg Chest Port  1 View  10/21/2015  CLINICAL DATA:  Acute onset of difficulty breathing. Initial encounter. EXAM: PORTABLE CHEST 1 VIEW COMPARISON:  Chest radiograph from 08/24/2015 FINDINGS: The lungs are hypoexpanded. Vascular congestion is noted. Mildly increased interstitial markings raise concern for mild interstitial edema. There is no evidence of pleural effusion or pneumothorax. The cardiomediastinal silhouette is enlarged. No acute osseous abnormalities are seen. IMPRESSION: Lungs hypoexpanded. Vascular congestion and cardiomegaly. Mildly increased interstitial markings raise concern for mild interstitial edema. Electronically Signed   By: Roanna Raider M.D.   On: 10/21/2015 00:08     Medications:     . allopurinol  300 mg Oral Daily  . antiseptic oral rinse  7 mL Mouth Rinse BID  . aspirin EC  81 mg Oral Daily  . atenolol  50 mg Oral Daily  . budesonide  0.5 mg Nebulization BID  . dabigatran  150 mg Oral Q12H  . docusate sodium  100 mg Oral Daily  . fluticasone  2 spray Each Nare Daily  . furosemide  80 mg Intravenous Q12H  . ipratropium-albuterol  3 mL Nebulization Q4H  . methylPREDNISolone (SOLU-MEDROL) injection  60 mg Intravenous Q12H  . mometasone-formoterol  2 puff Inhalation BID  . pantoprazole  40 mg Oral Daily  . potassium chloride  10 mEq Oral Daily  . vitamin B-6  25 mg Oral Daily  . sodium chloride  3 mL Intravenous Q12H  . sodium chloride  3 mL Intravenous Q12H   sodium chloride, acetaminophen **OR** acetaminophen, bisacodyl, ondansetron **OR** ondansetron (ZOFRAN) IV, sodium chloride, zolpidem  Assessment/ Plan:  Mr. Jonathan KNIPPENBERG Sr. is a 55 y.o. black male with atrial fibrillation, hypertension, history of TB, COPD, OSA on CPAP, gout, hyperlipidemia, and CHF, who was admitted to [facility] on 10/17/2015  1. Acute Renal Failure on Chronic Kidney Disease stage III: Acute renal failure secondary to overdiuresis. Patient does have peripheral edema but most likely intravascularly  deplete.   Chronic kidney secondary to hypertension, congestive heart failure and atrial fibrillation.  Baseline creatinine of 1.5-1.9 since 07/2015 however before that, creatinine was 1.  Not currently on an ACE-I/ARB.  - Renal ultrasound reviewed.  - hold furosemide, monitor renal function   2. Hypertension: hypotensive. Concerning for cardiomyopathy - home regeimn of torsemide, diltiazem, and atenolol.   3. Secondary Hyperparathyroidism: PTH of 153. Phos elevated at 5.7. Calcium at goal.   4. Congestive Heart Failure: acute exacerbation: diastolic with atrial fibrillation.  - echo pending.  - agree with high dose IV furosemide - monitor volume status.   5. Acute Exacerbation of COPD: placed on oxygen, nebs and steroids - appreciate pulmonary input.    LOS: 1 Jonathan [name] 12/31/20169:23 AM

## 2015-10-23 NOTE — Progress Notes (Signed)
Continues to be anxious and upset about his shortness of breath. On and off C-Pap. Refuses to be taught pursed lip breathing or even try to control anxiety and therefore improved shortness of breath by controlling resp rate or anxiety. Consulted with Dr Luberta MutterKonidena concerning the previously stated circumstances. Orders received.

## 2015-10-23 NOTE — Progress Notes (Signed)
Initial Nutrition Assessment   INTERVENTION:   Meals and Snacks: Cater to patient preferences on Heart Healthy Diet order   NUTRITION DIAGNOSIS:   Reassess on follow   GOAL:   Patient will meet greater than or equal to 90% of their needs  MONITOR:    (Energy Intake, Electrolyte and renal Profile, Anthropometrics, Digestive System)  REASON FOR ASSESSMENT:   Diagnosis    ASSESSMENT:   Pt admitted with SOB and lower extremity edema secondary to CHF exacerbation, requiring IV Lasix. Pt resting on CPAP this am on rounds.   Past Medical History  Diagnosis Date  . Atrial fibrillation (HCC)   . Hypertension   . Tuberculosis   . Sleep apnea   . COPD (chronic obstructive pulmonary disease) (HCC)     on 5l home oxygen  . Obesity   . Gout   . Hyperlipidemia   . Acute exacerbation of COPD with asthma (HCC) 08/20/2015  . Acute on chronic diastolic CHF (congestive heart failure) (HCC) 08/20/2015    Diet Order:  Diet Heart Room service appropriate?: Yes; Fluid consistency:: Thin    Current Nutrition: Pt nodded head yes that he had a breakfast tray this am. CNA reported pt did not eat first delivered tray as pt was resting on CPAP but had another one delivered. Unable to clarify how much was eaten, no tray in room, no family present.   Food/Nutrition-Related History: Per MST no decrease in appetite PTA. Unable to clarify po intake PTA on visit this am.   Scheduled Medications:  . allopurinol  300 mg Oral Daily  . antiseptic oral rinse  7 mL Mouth Rinse BID  . aspirin EC  81 mg Oral Daily  . atenolol  50 mg Oral Daily  . budesonide  0.5 mg Nebulization BID  . dabigatran  150 mg Oral Q12H  . docusate sodium  100 mg Oral Daily  . fluticasone  2 spray Each Nare Daily  . furosemide  80 mg Intravenous Q12H  . ipratropium-albuterol  3 mL Nebulization Q4H  . methylPREDNISolone (SOLU-MEDROL) injection  60 mg Intravenous Q12H  . mometasone-formoterol  2 puff Inhalation BID  .  pantoprazole  40 mg Oral Daily  . potassium chloride  10 mEq Oral Daily  . vitamin B-6  25 mg Oral Daily  . sodium chloride  3 mL Intravenous Q12H  . sodium chloride  3 mL Intravenous Q12H     Electrolyte/Renal Profile and Glucose Profile:   Recent Labs Lab 09/28/2015 2328 10/21/15 0609 10/21/15 1547 10-30-15 0443  NA 137 137  --  135  K 3.8 4.3  --  4.8  CL 96* 95*  --  96*  CO2 29 30  --  23  BUN 50* 52*  --  67*  CREATININE 1.73* 1.67*  --  2.50*  CALCIUM 9.3 9.3  --  9.4  PHOS  --   --  5.7*  --   GLUCOSE 109* 112*  --  130*   Protein Profile:   Recent Labs Lab 10/04/2015 2328  ALBUMIN 3.9    Gastrointestinal Profile: Last BM:  October 30, 2015   Nutrition-Focused Physical Exam Findings:  Unable to complete Nutrition-Focused physical exam at this time. RD notes 3+ edema per Nsg documentation   Weight Change: Per CHL encounters pt with 3% weight loss over last 2 months.    Height:   Ht Readings from Last 1 Encounters:  10/21/15  (1.778 m)    Weight:   Wt Readings from  Last 1 Encounters:  09/25/2015 311 lb 4.6 oz (141.2 kg)    Wt Readings from Last 10 Encounters:  10/21/2015 311 lb 4.6 oz (141.2 kg)  08/17/15 321 lb 14 oz (146 kg)  03/03/15 338 lb 3.2 oz (153.407 kg)    Ideal Body Weight:   75kg  BMI:  Body mass index is 44.67 kg/(m^2).  Estimated Nutritional Needs:   Kcal:  using IBW of 75kg, BEE: 1581kcals, TEE: (IF 1.1-1.3)(AF 1.2) [id number]  Protein:  60-75g protein (0.8-1.0g/kg)  Fluid:  1875-222650mL of fluid (25-5330mL/kg)  EDUCATION NEEDS:   Education needs no appropriate at this time    MODERATE Care Level  Leda QuailAllyson Laurana Magistro, RD, LDN Pager [phone] Weekend/On-Call Pager [phone]

## 2015-10-23 NOTE — Consult Note (Signed)
Code Blue called twice.   Patient was in vfib then asystole  for approx 10 minutes after the first code was called  Patient without any spont circulation or respirations. ACLS protocol started several meds given patient with V fib(MG, Lidocaine, EPI and Bicarb) and shocked x 1  Family called and updated and relayed to Sister and Wife of patient that patient had died/expired at [id number]

## 2015-10-23 NOTE — Progress Notes (Signed)
Patient was on bipap this morning. Known h/o pulmonary fibrosis, COPD and CHF Went to commode without assistance- collapsed on the commode Code blue called, unable to feels pulse initially CPR started, received 2 of epinephrine Intubated and now has a BP and sinus tach at 120 HR Intensivist placing central line Labs looked into. Blood glucose 74- 1amp of D50 ordered Further management per ICU attending.

## 2015-10-23 NOTE — Progress Notes (Signed)
*  PRELIMINARY RESULTS* Echocardiogram 2D Echocardiogram has been performed.  Jonathan [name] Nov 06, 2014, 12:30 PM

## 2015-10-23 NOTE — Progress Notes (Signed)
1545 Eyes prepped per CDS instructions

## 2015-10-23 NOTE — Progress Notes (Signed)
0730 yelling I cant breathe!!! Resp. Rate 46. O2 sats unreadable.Skin warm and dry. Nails blanche well. No cyanosis noted. Requested C-Pap applied. Done. Still demanding Resp. Therapy. Called. . C-Pap functioning well. Comfort offered. Refused.

## 2015-10-23 NOTE — Progress Notes (Signed)
1400 continues to switch on and off the C-Pap. Complains of shortness of breath just as he has all day. When working pulse ox. Reads 97-100%. Patient alert and oriented. Heart rate 120-130s all day. B/P ,low but stable. Remains edemetous.

## 2015-10-23 NOTE — Progress Notes (Signed)
1430 In room starting to bathe patient. [facility] Sat [facility] for 10 minutes. Refused tom converse with anyone except talk on cell phone.Leaned back on BSC. Ask fellow nurse to check patient while I was making bed. Unresponsive. Pupils checked. 3mm and sluggish. Pulse present. Called for other nurses to help get patient back to bed. Placed in bed by 4 nurses. 1442 Code called- see code sheet.

## 2015-10-23 NOTE — H&P (Signed)
PULMONARY CRITICAL CARE  PATIENT NAME: Jonathan [name]    MR#:  [id number]  DATE OF BIRTH:  1961/05/02  DATE OF ADMISSION:  10-26-2015  PRIMARY CARE PHYSICIAN: Leotis Shames, MD   REQUESTING/REFERRING PHYSICIAN: Dr. Meredeth Ide  CHIEF COMPLAINT:   Chief Complaint  Patient presents with  . Respiratory Distress    HISTORY OF PRESENT ILLNESS:  Still remains SOb, increased WOB, on CPAP High risk for intubation fio2 60%  Review of Systems  Unable to perform ROS: critical illness  Respiratory: Positive for cough, shortness of breath and wheezing. Negative for hemoptysis and sputum production.   Cardiovascular: Positive for orthopnea and leg swelling.  Psychiatric/Behavioral: The patient is nervous/anxious.     MEDICATIONS AT HOME:   Prior to Admission medications   Medication Sig Start Date End Date Taking? Authorizing Provider  acetaminophen (TYLENOL) 325 MG tablet Take 2 tablets (650 mg total) by mouth every 6 (six) hours as needed for mild pain (or Fever >/= 101). 08/26/15  Yes Leotis Shames, MD  albuterol (PROVENTIL) (2.5 MG/3ML) 0.083% nebulizer solution Take 3 mLs (2.5 mg total) by nebulization every 4 (four) hours as needed for wheezing or shortness of breath. 08/26/15  Yes Leotis Shames, MD  allopurinol (ZYLOPRIM) 300 MG tablet Take 300 mg by mouth daily.   Yes Historical Provider, MD  atenolol (TENORMIN) 50 MG tablet Take 50 mg by mouth daily.   Yes Historical Provider, MD  budesonide (PULMICORT) 0.25 MG/2ML nebulizer solution Take 2 mLs (0.25 mg total) by nebulization 2 (two) times daily. 08/26/15  Yes Leotis Shames, MD  colchicine 0.6 MG tablet Take 0.6 mg by mouth 2 (two) times daily as needed (for gout flares).    Yes Historical Provider, MD  dabigatran (PRADAXA) 150 MG CAPS capsule Take 1 capsule (150 mg total) by mouth every 12 (twelve) hours. 08/26/15  Yes Leotis Shames, MD  diltiazem (CARDIZEM CD) 240 MG 24 hr capsule Take 240 mg by mouth daily.   Yes Historical Provider,  MD  fluticasone (FLONASE) 50 MCG/ACT nasal spray Place 2 sprays into both nostrils daily.    Yes Historical Provider, MD  ipratropium-albuterol (DUONEB) 0.5-2.5 (3) MG/3ML SOLN Take 3 mLs by nebulization every 6 (six) hours. 08/26/15  Yes Leotis Shames, MD  mometasone-formoterol (DULERA) 100-5 MCG/ACT AERO Inhale 2 puffs into the lungs 2 (two) times daily. 08/26/15  Yes Leotis Shames, MD  potassium chloride (K-DUR,KLOR-CON) 10 MEQ tablet Take 1 tablet (10 mEq total) by mouth daily. 08/26/15  Yes Leotis Shames, MD  predniSONE (DELTASONE) 10 MG tablet Take 10 mg by mouth daily.   Yes Historical Provider, MD  torsemide (DEMADEX) 20 MG tablet Take 40 mg by mouth daily.   Yes Historical Provider, MD  vitamin B-6 (PYRIDOXINE) 25 MG tablet Take 25 mg by mouth daily.   Yes Historical Provider, MD  zolpidem (AMBIEN) 5 MG tablet Take 1 tablet (5 mg total) by mouth at bedtime as needed for sleep. Patient taking differently: Take 10 mg by mouth at bedtime as needed for sleep.  08/26/15  Yes Leotis Shames, MD  levofloxacin (LEVAQUIN) 500 MG tablet Take 1 tablet (500 mg total) by mouth daily. Patient not taking: Reported on 10/21/2015 08/26/15   Leotis Shames, MD  pantoprazole (PROTONIX) 40 MG tablet Take 1 tablet (40 mg total) by mouth daily. Patient not taking: Reported on 10/21/2015 08/26/15   Leotis Shames, MD  predniSONE (DELTASONE) 20 MG tablet Take 3 tablets daily for 4 days then 2 tablets daily for 4 days then one  tablet daily for 4 days. Patient not taking: Reported on 10/21/2015 08/26/15   Leotis Shames, MD      VITAL SIGNS:  Blood pressure 95/74, pulse 38, temperature 98.3 F (36.8 C), temperature source Oral, resp. rate 42, height  (1.778 m), weight 311 lb 4.6 oz (141.2 kg), SpO2 86 %.  PHYSICAL EXAMINATION:  Physical Exam  Constitutional: He appears distressed.  HENT:  Head: Normocephalic and atraumatic.  Eyes: Pupils are equal, round, and reactive to light. No scleral icterus.  Neck:  Normal range of motion. Neck supple.  Cardiovascular: Normal rate, regular rhythm and normal heart sounds.   Pulmonary/Chest: He is in respiratory distress. He has wheezes. He has rales.  Abdominal: Soft. Bowel sounds are normal.  Musculoskeletal: He exhibits edema.  Neurological: He is alert. No cranial nerve deficit.  Skin: Skin is warm. He is diaphoretic.      LABORATORY PANEL:   CBC  Recent Labs Lab 10/21/15 0609  WBC 12.3*  HGB 16.2  HCT 50.5  PLT 166   ------------------------------------------------------------------------------------------------------------------  Chemistries   Recent Labs Lab 11-17-15 2328  09/23/2015 0443  NA 137  < > 135  K 3.8  < > 4.8  CL 96*  < > 96*  CO2 29  < > 23  GLUCOSE 109*  < > 130*  BUN 50*  < > 67*  CREATININE 1.73*  < > 2.50*  CALCIUM 9.3  < > 9.4  AST 30  --   --   ALT 22  --   --   ALKPHOS 68  --   --   BILITOT 1.8*  --   --   < > = values in this interval not displayed. ------------------------------------------------------------------------------------------------------------------  Cardiac Enzymes  Recent Labs Lab 11/17/2015 2328  TROPONINI 0.04*   ------------------------------------------------------------------------------------------------------------------  RADIOLOGY:  US Renal  10/21/2015  CLINICAL DATA:  Stage III chronic renal disease EXAM: RENAL / URINARY TRACT ULTRASOUND COMPLETE COMPARISON:  None. FINDINGS: Right Kidney: Length: 9.8 cm. Echogenicity and renal cortical thickness are within normal limits. No mass, perinephric fluid, or hydronephrosis visualized. There is no sonographically demonstrable calculus or ureterectasis. Left Kidney: Length: 10.6 cm. Echogenicity and renal cortical thickness are within normal limits. No perinephric fluid or hydronephrosis visualized. There is a simple cyst arising from the lateral mid kidney measuring 3.9 x 4.4 x 4.1 cm. No sonographically demonstrable calculus or  ureterectasis. Bladder: Appears normal for degree of bladder distention. IMPRESSION: Cyst arising from midportion of left kidney. Study otherwise unremarkable. Electronically Signed   By: Bretta Bang III M.D.   On: 10/21/2015 17:05   Dg Chest Port 1 View  10/21/2015  CLINICAL DATA:  Acute onset of difficulty breathing. Initial encounter. EXAM: PORTABLE CHEST 1 VIEW COMPARISON:  Chest radiograph from 08/24/2015 FINDINGS: The lungs are hypoexpanded. Vascular congestion is noted. Mildly increased interstitial markings raise concern for mild interstitial edema. There is no evidence of pleural effusion or pneumothorax. The cardiomediastinal silhouette is enlarged. No acute osseous abnormalities are seen. IMPRESSION: Lungs hypoexpanded. Vascular congestion and cardiomegaly. Mildly increased interstitial markings raise concern for mild interstitial edema. Electronically Signed   By: Roanna Raider M.D.   On: 10/21/2015 00:08     IMPRESSION AND PLAN:   55 y.o. male with a known history of chronic COPD, lives on 8 L of oxygen at home, chronic congestive heart failure, obstructive sleep apnea, chronic atrial fibrillation currently on Coumadin and multiple other medical problems is present into the ED with a chief complaint  of worsening of shortness of breath.  Patient with end stage lung disease with very poor resp status. 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 Acute exacerbation of CHF - IV Lasix, Beta blockers - Input and Output - Counseled to limit fluids and Salt - Monitor Bun/Cr and Potassium -Cardiology follow up  2. Acute exacerbation of COPD. -IV steroids, Antibiotics - Scheduled Nebulizers - Inhalers biPAP as needed -high risk for inubation  3.CKD stage III Follow up nephrology recs -lasix stopped  If  patient remains on CPAP for next 24-48 hrs, will need to consider intubation and vent support    The Patient requires high complexity decision making for assessment and support, frequent evaluation and titration of therapies, application of advanced monitoring technologies and extensive interpretation of multiple databases. Critical Care Time devoted to patient care services described in this note is 35 minutes.   Overall, patient is critically ill, prognosis is guarded.  Patient with Multiorgan failure and at high risk for cardiac arrest and death.    Lucie LeatherKurian David Raegan Sipp, M.D.  [facility]  Medical Director [facility] Medical Director [facility]

## 2015-10-23 NOTE — Progress Notes (Signed)
[facility] [place] [facility]   PATIENT NAME: Jonathan [name]    MR#:  [id number]  DATE OF BIRTH:  01/25/1961  SUBJECTIVE: 55 year old male patient with pulmonary tuberculosis history, hypertension, chronic diastolic heart failure admitted because of shortness of breath, acute on chronic congestive heart failure.seen,now keeping bIPAP.very anxious.poor UOP<  CHIEF COMPLAINT:   Chief Complaint  Patient presents with  . Respiratory Distress    REVIEW OF SYSTEMS:    Review of Systems  Constitutional: Negative for fever and chills.  HENT: Negative for hearing loss.   Eyes: Negative for blurred vision, double vision and photophobia.  Respiratory: Positive for cough and shortness of breath. Negative for hemoptysis.   Cardiovascular: Positive for orthopnea and leg swelling. Negative for palpitations.  Gastrointestinal: Negative for vomiting, abdominal pain and diarrhea.  Genitourinary: Negative for dysuria and urgency.  Musculoskeletal: Negative for myalgias and neck pain.  Skin: Negative for rash.  Neurological: Negative for dizziness, focal weakness, seizures, weakness and headaches.  Psychiatric/Behavioral: Negative for memory loss. The patient does not have insomnia.     Nutrition:  Tolerating Diet: Tolerating PT:      DRUG ALLERGIES:  No Known Allergies  VITALS:  Blood pressure 108/97, pulse 113, temperature 98.3 F (36.8 C), temperature source Oral, resp. rate 34, height 5\' 10"  (1.778 m), weight 141.2 kg (311 lb 4.6 oz), SpO2 98 %.  PHYSICAL EXAMINATION:   Physical Exam  GENERAL:  55 y.o.-year-old patient lying in the bed with no acute distress.  EYES: Pupils equal, round, reactive to light and accommodation. No scleral icterus. Extraocular muscles intact.  HEENT: Head atraumatic, normocephalic. Oropharynx and nasopharynx clear.  NECK:  Supple, no jugular venous distention. No thyroid enlargement, no tenderness.  LUNGS;bilateral basilar  crepitations present no wheezing. Not using accessory muscles of respiration.  CARDIOVASCULAR: S1, S2 normal. Tachycardic. No murmurs, rubs, or gallops.  ABDOMEN: Soft, nontender, nondistended. Bowel sounds present. No organomegaly or mass.  EXTREMITIES: No pedal edema, cyanosis, or clubbing.  NEUROLOGIC: Cranial nerves II through XII are intact. Muscle strength 5/5 in all extremities. Sensation intact. Gait not checked.  PSYCHIATRIC: The patient is alert and oriented x 3.  SKIN: No obvious rash, lesion, or ulcer.    LABORATORY PANEL:   CBC  Recent Labs Lab 10/21/15 0609  WBC 12.3*  HGB 16.2  HCT 50.5  PLT 166   ------------------------------------------------------------------------------------------------------------------  Chemistries   Recent Labs Lab 10/09/2015 2328  10-07-15 0443  NA 137  < > 135  K 3.8  < > 4.8  CL 96*  < > 96*  CO2 29  < > 23  GLUCOSE 109*  < > 130*  BUN 50*  < > 67*  CREATININE 1.73*  < > 2.50*  CALCIUM 9.3  < > 9.4  AST 30  --   --   ALT 22  --   --   ALKPHOS 68  --   --   BILITOT 1.8*  --   --   < > = values in this interval not displayed. ------------------------------------------------------------------------------------------------------------------  Cardiac Enzymes  Recent Labs Lab 10/09/2015 2328  TROPONINI 0.04*   ------------------------------------------------------------------------------------------------------------------  RADIOLOGY:  [place] Renal  10/21/2015  CLINICAL DATA:  Stage III chronic renal disease EXAM: RENAL / URINARY TRACT ULTRASOUND COMPLETE COMPARISON:  None. FINDINGS: Right Kidney: Length: 9.8 cm. Echogenicity and renal cortical thickness are within normal limits. No mass, perinephric fluid, or hydronephrosis visualized. There is no sonographically demonstrable calculus or ureterectasis. Left Kidney: Length: 10.6  cm. Echogenicity and renal cortical thickness are within normal limits. No perinephric fluid or  hydronephrosis visualized. There is a simple cyst arising from the lateral mid kidney measuring 3.9 x 4.4 x 4.1 cm. No sonographically demonstrable calculus or ureterectasis. Bladder: Appears normal for degree of bladder distention. IMPRESSION: Cyst arising from midportion of left kidney. Study otherwise unremarkable. Electronically Signed   By: Bretta Bang III M.D.   On: 10/21/2015 17:05   Dg Chest Port 1 View  10/21/2015  CLINICAL DATA:  Acute onset of difficulty breathing. Initial encounter. EXAM: PORTABLE CHEST 1 VIEW COMPARISON:  Chest radiograph from 08/24/2015 FINDINGS: The lungs are hypoexpanded. Vascular congestion is noted. Mildly increased interstitial markings raise concern for mild interstitial edema. There is no evidence of pleural effusion or pneumothorax. The cardiomediastinal silhouette is enlarged. No acute osseous abnormalities are seen. IMPRESSION: Lungs hypoexpanded. Vascular congestion and cardiomegaly. Mildly increased interstitial markings raise concern for mild interstitial edema. Electronically Signed   By: Roanna Raider M.D.   On: 10/21/2015 00:08     ASSESSMENT AND PLAN:   Active Problems:   CHF (congestive heart failure) (HCC)   Acute on chronic respiratory failure (HCC)   COPD exacerbation (HCC)   #1, acute on chronic diastolic heart failure;  ; continue IV Lasix. ECHO,consult cardio, continue beta blockers for tachycardia and CHF.  2.Acute on chronic respiratory failure; due to COPD evaluation: Continue IV steroids, antibiotics, nebulizers. His BiPAP as needed. High risk for intubation. Pulmonary following. End stage copd;sees Dr.Fleming.on 8 litres at home, Pulmicort.  #3. Chronic kidney disease stage III: Creatinine is 1.67 at baseline and her GFR is around 49. Patient follows up with Dr. Mosetta Pigeon, we will consult nephrology. For history of pulmonary tuberculosis years ago;finished treatment. #5 chronic obstructive sleep apnea continue CPAP at  night. 6. Chronic atrial fibrillation:;continue Pradaxa, use atenolol 7.anxiety;use xanax  Patient is at high risk for cardiac arrest./and intubation  All the records are reviewed and case discussed with Care Management/Social Workerr. Management plans discussed with the patient, family and they are in agreement.  CODE STATUS: Full  TOTAL TIME TAKING CARE OF THIS PATIENT: 40 minutes.(CCT)  POSSIBLE D/C IN 3-4DAYS, DEPENDING ON CLINICAL CONDITION.   Katha Hamming M.D on 10/17/2015 at 12:41 PM  Between 7am to 6pm - Pager - [phone]  After 6pm go to [email or web address] - password [email or web address] [facility]  [place] [facility]  Office  [phone]  CC: Primary care physician; Leotis Shames, MD

## 2015-10-23 NOTE — Progress Notes (Signed)
Pt states that he will place himself on CPAP when he is ready to where it.

## 2015-10-23 DEATH — deceased

## 2015-10-25 LAB — PROTEIN ELECTROPHORESIS, SERUM
A/G RATIO SPE: 1.3 (ref 0.7–1.7)
Albumin ELP: 3.5 g/dL (ref 2.9–4.4)
Alpha-1-Globulin: 0.3 g/dL (ref 0.0–0.4)
Alpha-2-Globulin: 0.7 g/dL (ref 0.4–1.0)
BETA GLOBULIN: 1 g/dL (ref 0.7–1.3)
GAMMA GLOBULIN: 0.9 g/dL (ref 0.4–1.8)
Globulin, Total: 2.8 g/dL (ref 2.2–3.9)
TOTAL PROTEIN ELP: 6.3 g/dL (ref 6.0–8.5)

## 2015-11-10 ENCOUNTER — Ambulatory Visit: Payer: Medicaid Other | Admitting: Family

## 2015-11-23 NOTE — Discharge Summary (Signed)
Death Note please see Last Note for all details.   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 Patient started on IV Lasix, monitor input output. COPD patient was given IV steroids, nebulizers, antibiotics. Patient started on BiPAP. Chest x-ray showed vascular congestion.pt  Condition deteriorated despite aggressive treatment,collapsed on  Commode.code blue was called.ACls protocol followed with Epi,bicarb.also lidocaine for vfib.seen by DR.kasa ,pt coded twice in total, was stopped at 14.58.familyw as informed by Dr.Kasa.    Jonathan [name] CSN:[id number],MRN:[id number] is a 55 y.o. male, Outpatient Primary MD for the patient is Singh,Jasmine, MD  Pronounced dead  On 20-Nov-2015 at 2.58 pm, Cause of death  Acute on chronic respiratory failure Acute on chronic systolic chf End stage lung disease  Total clinical and documentation time for today;more than 30 min.   Last Note; 55 year old male patient with pulmonary tuberculosis history, hypertension, chronic diastolic heart failure admitted because of shortness of breath, acute on chronic congestive heart failure.seen,now keeping bIPAP.very anxious.poor UOP<  CHIEF COMPLAINT:  Chief Complaint  Patient presents with  . Respiratory Distress    REVIEW OF SYSTEMS:   Review of Systems  Constitutional: Negative for fever and chills.  HENT: Negative for hearing loss.  Eyes: Negative for blurred vision, double vision and photophobia.  Respiratory: Positive for cough and shortness of breath. Negative for hemoptysis.  Cardiovascular: Positive for orthopnea and leg swelling. Negative for palpitations.  Gastrointestinal: Negative for vomiting, abdominal pain and diarrhea.  Genitourinary:  Negative for dysuria and urgency.  Musculoskeletal: Negative for myalgias and neck pain.  Skin: Negative for rash.  Neurological: Negative for dizziness, focal weakness, seizures, weakness and headaches.  Psychiatric/Behavioral: Negative for memory loss. The patient does not have insomnia.    Nutrition:  Tolerating Diet: Tolerating PT:      DRUG ALLERGIES:  No Known Allergies  VITALS:  Blood pressure 108/97, pulse 113, temperature 98.3 F (36.8 C), temperature source Oral, resp. rate 34, height 5\' 10"  (1.778 m), weight 141.2 kg (311 lb 4.6 oz), SpO2 98 %.  PHYSICAL EXAMINATION:   Physical Exam  GENERAL: 55 y.o.-year-old patient lying in the bed with no acute distress.  EYES: Pupils equal, round, reactive to light and accommodation. No scleral icterus. Extraocular muscles intact.  HEENT: Head atraumatic, normocephalic. Oropharynx and nasopharynx clear.  NECK: Supple, no jugular venous distention. No thyroid enlargement, no tenderness.  LUNGS;bilateral basilar crepitations present no wheezing. Not using accessory muscles of respiration.  CARDIOVASCULAR: S1, S2 normal. Tachycardic. No murmurs, rubs, or gallops.  ABDOMEN: Soft, nontender, nondistended. Bowel sounds present. No organomegaly or mass.  EXTREMITIES: No pedal edema, cyanosis, or clubbing.  NEUROLOGIC: Cranial nerves II through XII are intact. Muscle strength 5/5 in all extremities. Sensation intact. Gait not checked.  PSYCHIATRIC: The patient is alert and oriented x 3.  SKIN: No obvious rash, lesion, or ulcer.    LABORATORY PANEL:   CBC  Last Labs      Recent Labs Lab 10/21/15 0609  WBC 12.3*  HGB 16.2  HCT 50.5  PLT 166     ------------------------------------------------------------------------------------------------------------------  Chemistries   Last Labs      Recent Labs Lab 09/28/2015 2328  20-Nov-2015 0443  NA 137 < > 135  K 3.8 < > 4.8  CL  96* < > 96*  CO2 29 < > 23  GLUCOSE 109* < >  130*  BUN 50* < > 67*  CREATININE 1.73* < > 2.50*  CALCIUM 9.3 < > 9.4  AST 30 --  --   ALT 22 --  --   ALKPHOS 68 --  --   BILITOT 1.8* --  --   < > = values in this interval not displayed.   ------------------------------------------------------------------------------------------------------------------  Cardiac Enzymes  Last Labs      Recent Labs Lab 10/10/2015 2328  TROPONINI 0.04*     ------------------------------------------------------------------------------------------------------------------  RADIOLOGY:   Imaging Results (Last 48 hours)    [place] Renal  10/21/2015 CLINICAL DATA: Stage III chronic renal disease EXAM: RENAL / URINARY TRACT ULTRASOUND COMPLETE COMPARISON: None. FINDINGS: Right Kidney: Length: 9.8 cm. Echogenicity and renal cortical thickness are within normal limits. No mass, perinephric fluid, or hydronephrosis visualized. There is no sonographically demonstrable calculus or ureterectasis. Left Kidney: Length: 10.6 cm. Echogenicity and renal cortical thickness are within normal limits. No perinephric fluid or hydronephrosis visualized. There is a simple cyst arising from the lateral mid kidney measuring 3.9 x 4.4 x 4.1 cm. No sonographically demonstrable calculus or ureterectasis. Bladder: Appears normal for degree of bladder distention. IMPRESSION: Cyst arising from midportion of left kidney. Study otherwise unremarkable. Electronically Signed By: Bretta BangWilliam Woodruff III M.D. On: 10/21/2015 17:05   Dg Chest Port 1 View  10/21/2015 CLINICAL DATA: Acute onset of difficulty breathing. Initial encounter. EXAM: PORTABLE CHEST 1 VIEW COMPARISON: Chest radiograph from 08/24/2015 FINDINGS: The lungs are hypoexpanded. Vascular congestion is noted. Mildly increased interstitial markings raise concern for mild interstitial edema. There is no evidence of pleural  effusion or pneumothorax. The cardiomediastinal silhouette is enlarged. No acute osseous abnormalities are seen. IMPRESSION: Lungs hypoexpanded. Vascular congestion and cardiomegaly. Mildly increased interstitial markings raise concern for mild interstitial edema. Electronically Signed By: Roanna RaiderJeffery Chang M.D. On: 10/21/2015 00:08      ASSESSMENT AND PLAN:   Active Problems:  CHF (congestive heart failure) (HCC)  Acute on chronic respiratory failure (HCC)  COPD exacerbation (HCC)   #1, acute on chronic diastolic heart failure; ; continue IV Lasix. ECHO,consult cardio, continue beta blockers for tachycardia and CHF.  2.Acute on chronic respiratory failure; due to COPD evaluation: Continue IV steroids, antibiotics, nebulizers.continue BIPAP High risk for intubation. Pulmonary following. End stage copd;sees Dr.Fleming.on 8 litres at home, Pulmicort.  #3. Chronic kidney disease stage III: Creatinine is 1.67 at baseline and her GFR is around 49. Patient follows up with Dr. Mosetta PigeonHarmeet Singh, we will consult nephrology. For history of pulmonary tuberculosis years ago;finished treatment. #5 chronic obstructive sleep apnea continue CPAP at night. 6. Chronic atrial fibrillation:;continue Pradaxa, use atenolol 7.anxiety;use xanax  Patient is at high risk for cardiac arrest./and intubation  All the records are reviewed and case discussed with Care Management/Social Workerr. Management plans discussed with the patient, family and they are in agreement.  CODE STATUS: Full

## 2015-12-02 IMAGING — CT CT CHEST W/ CM
2 of 6 series · 15 of 46 positions shown, 17 images · IV contrast (agent unspecified)
Comparison: None.

CLINICAL DATA: 53-year-old male with a history of recurrent
pneumonia.

EXAM:
CT CHEST WITH CONTRAST
TECHNIQUE: Multidetector CT imaging of the chest was performed during
intravenous contrast administration.
CONTRAST:  80 cc omni 350

[Series 5: routine chest with · axial · 0.75mm/px · z∈[+88,+323]mm · 12 of 57 slices shown, 14 images]
[im 5/57  soft-tissue]
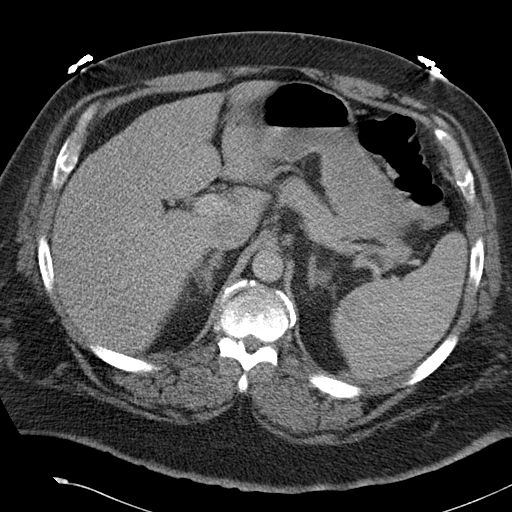
[im 5/57  bone]
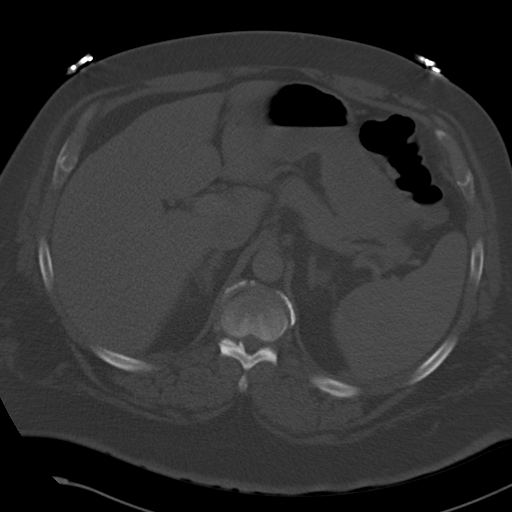
[im 9/57  soft-tissue]
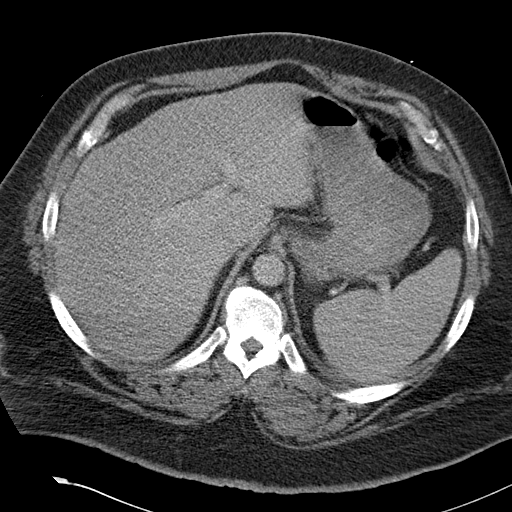
[im 13/57  soft-tissue]
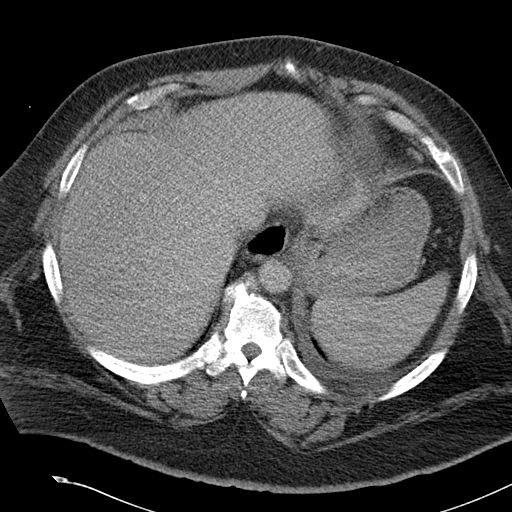
[im 18/57  soft-tissue]
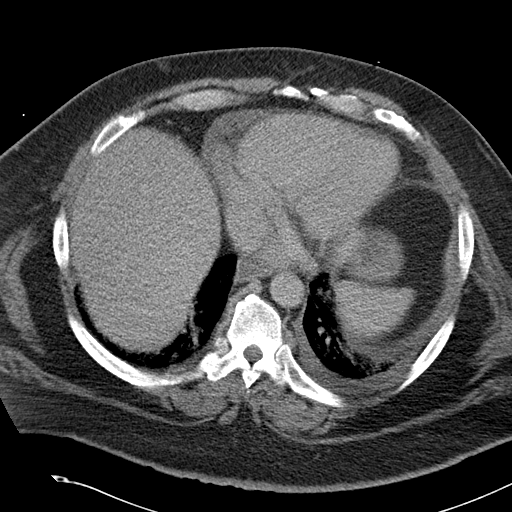
[im 22/57  soft-tissue]
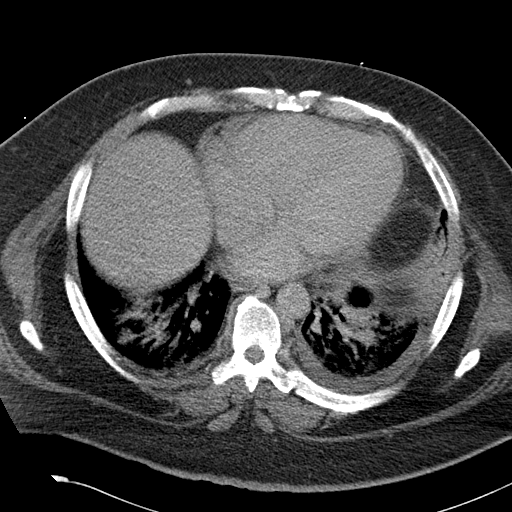
[im 26/57  soft-tissue]
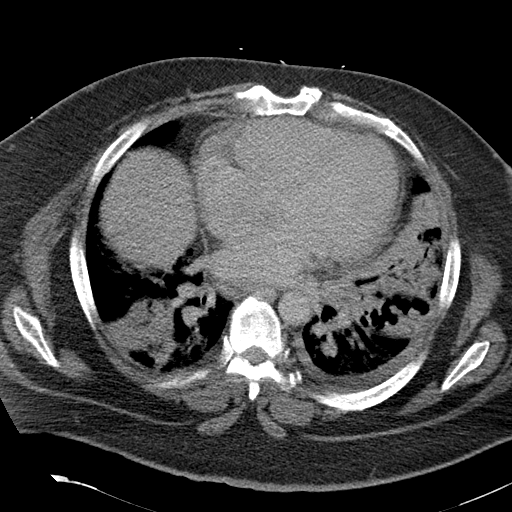
[im 31/57  soft-tissue]
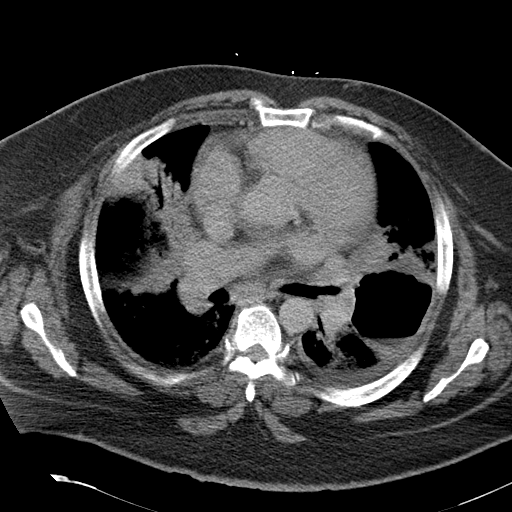
[im 35/57  soft-tissue]
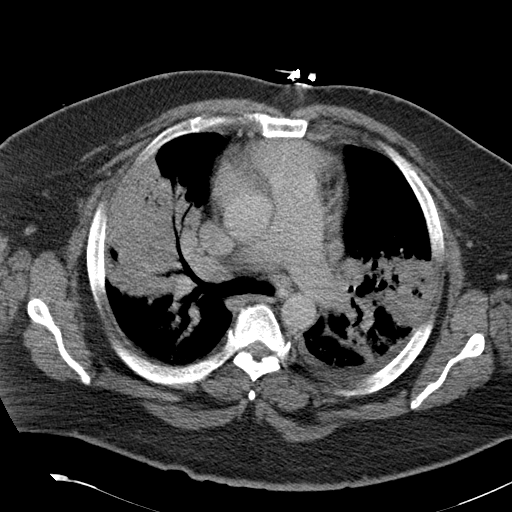
[im 39/57  soft-tissue]
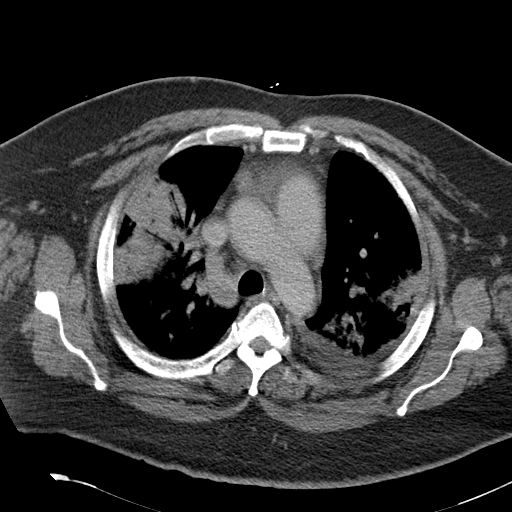
[im 39/57  bone]
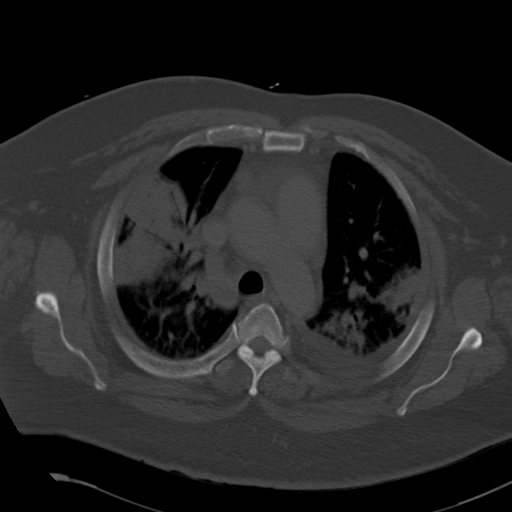
[im 44/57  soft-tissue]
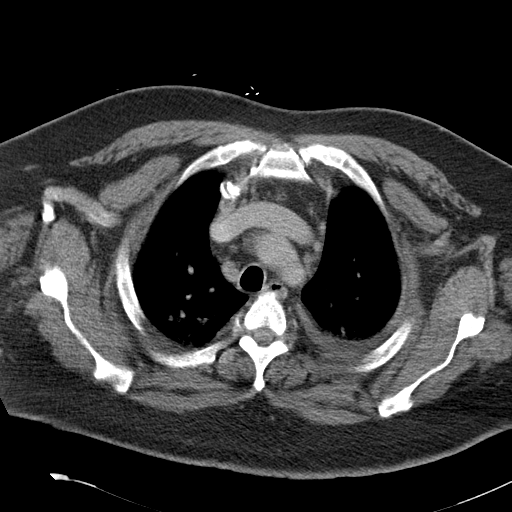
[im 48/57  soft-tissue]
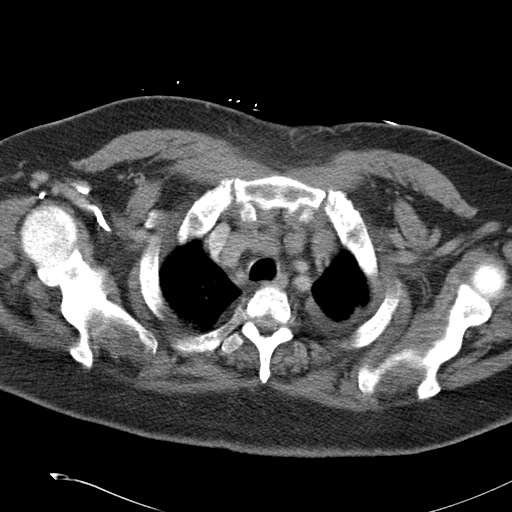
[im 52/57  soft-tissue]
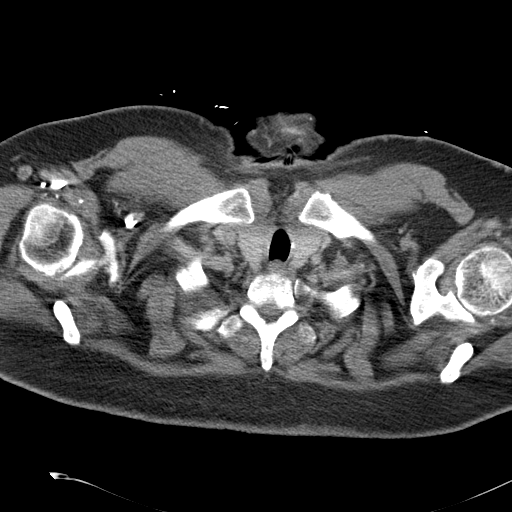

[Series 8: routine chest with cor · coronal · 0.58mm/px · 3 of 171 slices shown]
[im 35/171  soft-tissue]
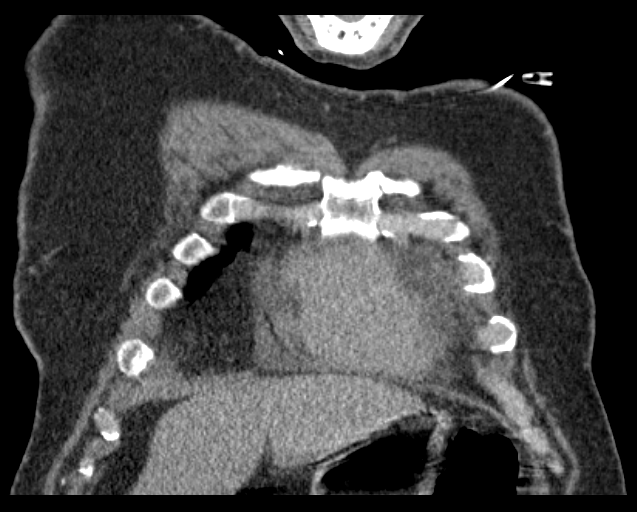
[im 69/171  soft-tissue]
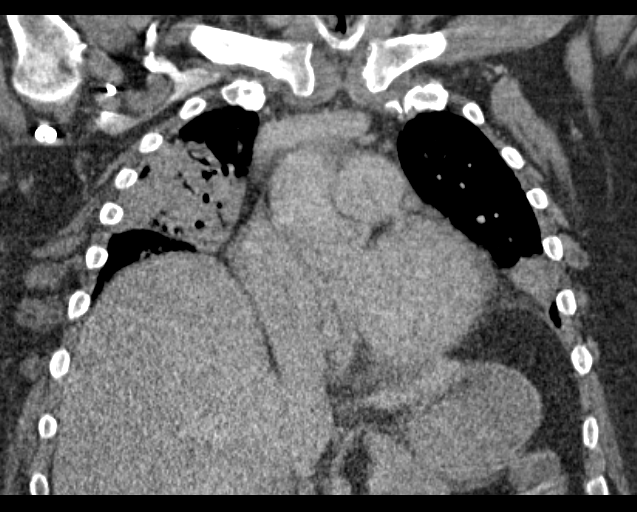
[im 103/171  soft-tissue]
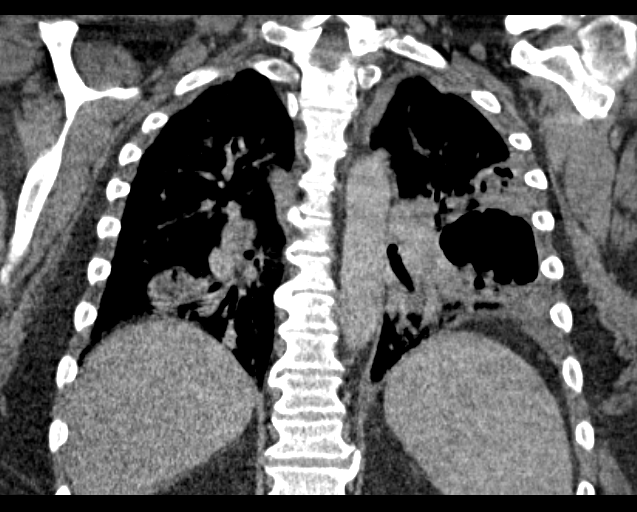

[15 of 46 positions shown; findings below may reference images not displayed]

FINDINGS: Chest:

Superficial soft tissue chest wall unremarkable appearing

No axillary or supraclavicular adenopathy.

Unremarkable appearance the thoracic inlet, including thyroid gland.

Central airways are patent.  No debris.

Multiple pancreas spinal lymph nodes. Largest lymph nodes includes
lowest right peritracheal measuring 11 mm and AP window lymph node
measuring 12 mm.

Small hiatal hernia with air-fluid level.

Heart size borderline enlarged. No significant pericardial fluid/
thickening.

No aneurysm or dissection flap identified.

No periaortic fluid.  No significant atherosclerotic calcifications.

Contrast bolus not optimized for evaluation of the pulmonary
arteries.

Multifocal airspace disease involving right upper lobe, right lower
lobe, left upper lobe, left lower lobe. The distribution is similar
to the comparison CT dated 12/19/2014, with slight progression in
volume involving the right upper lobe and right middle lobe.

Development of small air loculations in the confluent disease of the
left upper lobe.

Similar appearance of the cavitary lesion of the lingula. Size on
the prior measured as greatest 7.4 cm, with greatest diameter on the
current measuring 6.7 cm. Air-fluid level persists. Trace bilateral
pleural effusions.

Upper abdomen:

Unremarkable appearance of the visualized upper abdomen.

Musculoskeletal:

No acute fractures identified.

No aggressive bony lesions.

Multilevel degenerative changes of the thoracic spine.
IMPRESSION: Similar distribution of multi focal consolidative airspace disease,
most compatible with multifocal pneumonia. In the interval there has
been slight progression in the volume of involvement of the right
upper lobe, with relatively unchanged appearance in distribution of
the right lower lobe, left upper lobe, left lower lobe. Small
bilateral parapneumonic effusions.

Similar appearance of the cavitary lesion of the left upper lobe
with small air-fluid level. Interval development of small air
loculations in the confluent disease of the left upper lobe,
adjacent to the cavitary lesion, representing additional regions of
cavitation, pneumatocele formation, and/or bronchiectasis.

Likely reactive and mediastinal lymph nodes.

Similar appearance of air-fluid level in the distal esophagus with
small hiatal hernia.

## 2015-12-03 IMAGING — CR DG CHEST 1V PORT
1 series · 1 of 1 positions shown · non-contrast
Comparison: Chest x-ray of January 05, 2015

CLINICAL DATA: Status post PICC line placement

EXAM:
PORTABLE CHEST - 1 VIEW

[ap]
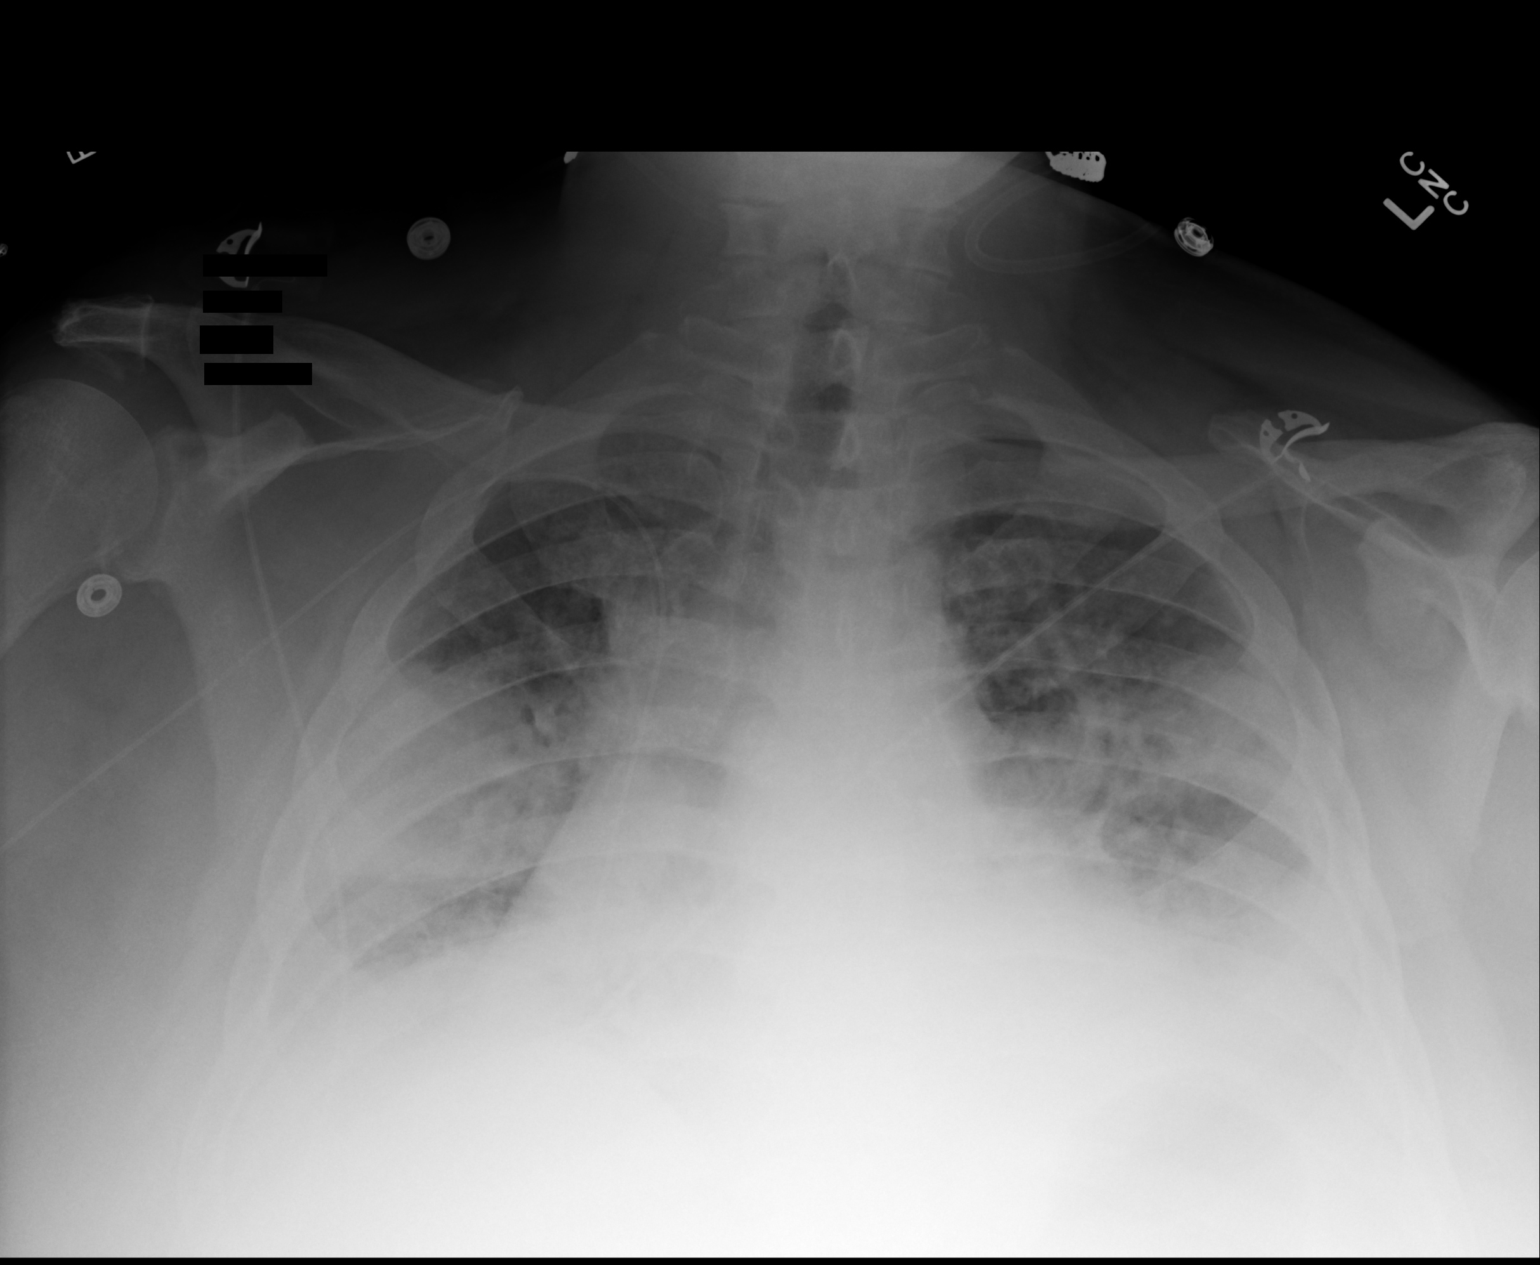

[1 of 1 positions shown; findings below may reference images not displayed]

FINDINGS: The patient has undergone placement of PICC line via the right upper
extremity. The tip of the catheter projects over the mid to distal
SVC. There is no postprocedure complication. There persistent
bilateral alveolar infiltrates with bilateral pulmonary hypo
inflation.
IMPRESSION: The patient has undergone right-sided PICC line placement without
evidence of postprocedure complication.
